# Patient Record
Sex: Female | Born: 1985 | Race: White | Hispanic: No | Marital: Single | Smoking: Former smoker
Health system: Southern US, Community
[De-identification: ages and names within clinical notes are randomized; demographics above are authoritative.]

## PROBLEM LIST (undated history)

## (undated) ENCOUNTER — Inpatient Hospital Stay (HOSPITAL_COMMUNITY): Payer: Self-pay

## (undated) DIAGNOSIS — B977 Papillomavirus as the cause of diseases classified elsewhere: Secondary | ICD-10-CM

## (undated) DIAGNOSIS — IMO0002 Reserved for concepts with insufficient information to code with codable children: Secondary | ICD-10-CM

## (undated) DIAGNOSIS — Z87440 Personal history of urinary (tract) infections: Secondary | ICD-10-CM

## (undated) DIAGNOSIS — N760 Acute vaginitis: Secondary | ICD-10-CM

## (undated) DIAGNOSIS — E282 Polycystic ovarian syndrome: Secondary | ICD-10-CM

## (undated) DIAGNOSIS — Z818 Family history of other mental and behavioral disorders: Secondary | ICD-10-CM

## (undated) DIAGNOSIS — Z8742 Personal history of other diseases of the female genital tract: Secondary | ICD-10-CM

## (undated) DIAGNOSIS — N189 Chronic kidney disease, unspecified: Secondary | ICD-10-CM

## (undated) DIAGNOSIS — B009 Herpesviral infection, unspecified: Secondary | ICD-10-CM

## (undated) DIAGNOSIS — I10 Essential (primary) hypertension: Secondary | ICD-10-CM

## (undated) DIAGNOSIS — Z87442 Personal history of urinary calculi: Secondary | ICD-10-CM

## (undated) DIAGNOSIS — R87619 Unspecified abnormal cytological findings in specimens from cervix uteri: Secondary | ICD-10-CM

## (undated) DIAGNOSIS — R300 Dysuria: Secondary | ICD-10-CM

## (undated) DIAGNOSIS — B9689 Other specified bacterial agents as the cause of diseases classified elsewhere: Secondary | ICD-10-CM

## (undated) DIAGNOSIS — R87629 Unspecified abnormal cytological findings in specimens from vagina: Secondary | ICD-10-CM

## (undated) DIAGNOSIS — N870 Mild cervical dysplasia: Secondary | ICD-10-CM

## (undated) DIAGNOSIS — B999 Unspecified infectious disease: Secondary | ICD-10-CM

## (undated) HISTORY — DX: Family history of other mental and behavioral disorders: Z81.8

## (undated) HISTORY — DX: Reserved for concepts with insufficient information to code with codable children: IMO0002

## (undated) HISTORY — PX: OTHER SURGICAL HISTORY: SHX169

## (undated) HISTORY — DX: Unspecified abnormal cytological findings in specimens from cervix uteri: R87.619

## (undated) HISTORY — DX: Papillomavirus as the cause of diseases classified elsewhere: B97.7

## (undated) HISTORY — DX: Mild cervical dysplasia: N87.0

## (undated) HISTORY — DX: Personal history of urinary (tract) infections: Z87.440

## (undated) HISTORY — DX: Personal history of other diseases of the female genital tract: Z87.42

## (undated) HISTORY — DX: Acute vaginitis: N76.0

## (undated) HISTORY — DX: Herpesviral infection, unspecified: B00.9

## (undated) HISTORY — DX: Dysuria: R30.0

## (undated) HISTORY — DX: Polycystic ovarian syndrome: E28.2

## (undated) HISTORY — DX: Other specified bacterial agents as the cause of diseases classified elsewhere: B96.89

---
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
## 2004-02-19 ENCOUNTER — Other Ambulatory Visit: Admission: RE | Admit: 2004-02-19 | Discharge: 2004-02-19 | Payer: Self-pay | Admitting: Obstetrics and Gynecology

## 2004-02-19 DIAGNOSIS — Z8742 Personal history of other diseases of the female genital tract: Secondary | ICD-10-CM

## 2004-02-19 HISTORY — DX: Personal history of other diseases of the female genital tract: Z87.42

## 2004-04-29 DIAGNOSIS — B9689 Other specified bacterial agents as the cause of diseases classified elsewhere: Secondary | ICD-10-CM

## 2004-04-29 HISTORY — DX: Other specified bacterial agents as the cause of diseases classified elsewhere: B96.89

## 2004-08-21 HISTORY — PX: TONSILLECTOMY: SUR1361

## 2005-04-04 ENCOUNTER — Other Ambulatory Visit: Admission: RE | Admit: 2005-04-04 | Discharge: 2005-04-04 | Payer: Self-pay | Admitting: Obstetrics and Gynecology

## 2005-04-04 DIAGNOSIS — R300 Dysuria: Secondary | ICD-10-CM

## 2005-04-04 HISTORY — DX: Dysuria: R30.0

## 2005-05-08 DIAGNOSIS — E282 Polycystic ovarian syndrome: Secondary | ICD-10-CM

## 2005-05-08 HISTORY — DX: Polycystic ovarian syndrome: E28.2

## 2006-05-29 ENCOUNTER — Other Ambulatory Visit: Admission: RE | Admit: 2006-05-29 | Discharge: 2006-05-29 | Payer: Self-pay | Admitting: Obstetrics and Gynecology

## 2007-08-01 DIAGNOSIS — B977 Papillomavirus as the cause of diseases classified elsewhere: Secondary | ICD-10-CM

## 2007-08-01 HISTORY — DX: Papillomavirus as the cause of diseases classified elsewhere: B97.7

## 2007-12-19 ENCOUNTER — Inpatient Hospital Stay (HOSPITAL_COMMUNITY): Admission: AD | Admit: 2007-12-19 | Discharge: 2007-12-19 | Payer: Self-pay | Admitting: Obstetrics and Gynecology

## 2007-12-24 ENCOUNTER — Inpatient Hospital Stay (HOSPITAL_COMMUNITY): Admission: AD | Admit: 2007-12-24 | Discharge: 2007-12-24 | Payer: Self-pay | Admitting: Obstetrics and Gynecology

## 2008-01-13 ENCOUNTER — Inpatient Hospital Stay (HOSPITAL_COMMUNITY): Admission: AD | Admit: 2008-01-13 | Discharge: 2008-01-13 | Payer: Self-pay | Admitting: Obstetrics and Gynecology

## 2008-01-15 ENCOUNTER — Encounter: Admission: RE | Admit: 2008-01-15 | Discharge: 2008-01-15 | Payer: Self-pay | Admitting: Obstetrics and Gynecology

## 2008-04-19 ENCOUNTER — Inpatient Hospital Stay (HOSPITAL_COMMUNITY): Admission: AD | Admit: 2008-04-19 | Discharge: 2008-04-19 | Payer: Self-pay | Admitting: Obstetrics and Gynecology

## 2008-08-06 ENCOUNTER — Inpatient Hospital Stay (HOSPITAL_COMMUNITY): Admission: AD | Admit: 2008-08-06 | Discharge: 2008-08-08 | Payer: Self-pay | Admitting: Obstetrics and Gynecology

## 2008-08-21 HISTORY — PX: CHOLECYSTECTOMY, LAPAROSCOPIC: SHX56

## 2008-10-13 ENCOUNTER — Emergency Department (HOSPITAL_COMMUNITY): Admission: EM | Admit: 2008-10-13 | Discharge: 2008-10-13 | Payer: Self-pay | Admitting: Emergency Medicine

## 2008-11-05 ENCOUNTER — Encounter (INDEPENDENT_AMBULATORY_CARE_PROVIDER_SITE_OTHER): Payer: Self-pay | Admitting: General Surgery

## 2008-11-05 ENCOUNTER — Ambulatory Visit (HOSPITAL_COMMUNITY): Admission: RE | Admit: 2008-11-05 | Discharge: 2008-11-05 | Payer: Self-pay | Admitting: General Surgery

## 2008-11-09 ENCOUNTER — Emergency Department (HOSPITAL_COMMUNITY): Admission: EM | Admit: 2008-11-09 | Discharge: 2008-11-09 | Payer: Self-pay | Admitting: Emergency Medicine

## 2009-10-13 ENCOUNTER — Inpatient Hospital Stay (HOSPITAL_COMMUNITY): Admission: AD | Admit: 2009-10-13 | Discharge: 2009-10-13 | Payer: Self-pay | Admitting: Obstetrics and Gynecology

## 2010-02-26 ENCOUNTER — Inpatient Hospital Stay (HOSPITAL_COMMUNITY): Admission: AD | Admit: 2010-02-26 | Discharge: 2010-02-28 | Payer: Self-pay | Admitting: Obstetrics and Gynecology

## 2010-05-02 DIAGNOSIS — IMO0002 Reserved for concepts with insufficient information to code with codable children: Secondary | ICD-10-CM

## 2010-05-02 HISTORY — DX: Reserved for concepts with insufficient information to code with codable children: IMO0002

## 2010-11-06 LAB — RPR: RPR Ser Ql: NONREACTIVE

## 2010-11-06 LAB — CBC
Hemoglobin: 13.3 g/dL (ref 12.0–15.0)
MCH: 33.2 pg (ref 26.0–34.0)
MCHC: 34.8 g/dL (ref 30.0–36.0)
MCHC: 35.1 g/dL (ref 30.0–36.0)
Platelets: 164 10*3/uL (ref 150–400)
Platelets: 194 10*3/uL (ref 150–400)
RDW: 14.2 % (ref 11.5–15.5)
RDW: 14.6 % (ref 11.5–15.5)
WBC: 13.9 10*3/uL — ABNORMAL HIGH (ref 4.0–10.5)

## 2010-11-09 LAB — DIFFERENTIAL
Basophils Absolute: 0 10*3/uL (ref 0.0–0.1)
Basophils Relative: 0 % (ref 0–1)
Eosinophils Absolute: 0.1 10*3/uL (ref 0.0–0.7)
Monocytes Absolute: 0.5 10*3/uL (ref 0.1–1.0)
Monocytes Relative: 5 % (ref 3–12)
Neutro Abs: 10 10*3/uL — ABNORMAL HIGH (ref 1.7–7.7)
Neutrophils Relative %: 88 % — ABNORMAL HIGH (ref 43–77)

## 2010-11-09 LAB — CBC
Hemoglobin: 11.2 g/dL — ABNORMAL LOW (ref 12.0–15.0)
MCHC: 34.7 g/dL (ref 30.0–36.0)
MCV: 92.3 fL (ref 78.0–100.0)
RBC: 3.5 MIL/uL — ABNORMAL LOW (ref 3.87–5.11)
RDW: 12.6 % (ref 11.5–15.5)

## 2010-11-09 LAB — URINALYSIS, ROUTINE W REFLEX MICROSCOPIC
Nitrite: NEGATIVE
Protein, ur: NEGATIVE mg/dL
Specific Gravity, Urine: 1.01 (ref 1.005–1.030)
Urobilinogen, UA: 0.2 mg/dL (ref 0.0–1.0)

## 2010-11-09 LAB — URINE MICROSCOPIC-ADD ON

## 2010-12-01 LAB — COMPREHENSIVE METABOLIC PANEL
AST: 263 U/L — ABNORMAL HIGH (ref 0–37)
BUN: 12 mg/dL (ref 6–23)
CO2: 27 mEq/L (ref 19–32)
Chloride: 106 mEq/L (ref 96–112)
Creatinine, Ser: 0.64 mg/dL (ref 0.4–1.2)
GFR calc non Af Amer: >60 mL/min (ref >60)
Glucose, Bld: 109 mg/dL — ABNORMAL HIGH (ref 70–99)
Total Bilirubin: 1.3 mg/dL — ABNORMAL HIGH (ref 0.3–1.2)

## 2010-12-01 LAB — CBC
HCT: 38.3 % (ref 36.0–46.0)
Hemoglobin: 13.4 g/dL (ref 12.0–15.0)
MCHC: 34.9 g/dL (ref 30.0–36.0)
MCV: 88.7 fL (ref 78.0–100.0)
RBC: 4.32 MIL/uL (ref 3.87–5.11)
WBC: 11.6 10*3/uL — ABNORMAL HIGH (ref 4.0–10.5)

## 2010-12-01 LAB — URINALYSIS, ROUTINE W REFLEX MICROSCOPIC
Bilirubin Urine: NEGATIVE
Glucose, UA: NEGATIVE mg/dL
Hgb urine dipstick: NEGATIVE
Specific Gravity, Urine: 1.022 (ref 1.005–1.030)

## 2010-12-01 LAB — DIFFERENTIAL
Basophils Absolute: 0 10*3/uL (ref 0.0–0.1)
Eosinophils Relative: 1 % (ref 0–5)
Lymphocytes Relative: 15 % (ref 12–46)
Neutro Abs: 9.2 10*3/uL — ABNORMAL HIGH (ref 1.7–7.7)
Neutrophils Relative %: 80 % — ABNORMAL HIGH (ref 43–77)

## 2010-12-01 LAB — LIPASE, BLOOD: Lipase: 21 U/L (ref 11–59)

## 2010-12-06 LAB — COMPREHENSIVE METABOLIC PANEL
AST: 32 U/L (ref 0–37)
Albumin: 4.4 g/dL (ref 3.5–5.2)
Calcium: 9.9 mg/dL (ref 8.4–10.5)
Creatinine, Ser: 0.83 mg/dL (ref 0.4–1.2)
GFR calc Af Amer: >60 mL/min (ref >60)

## 2010-12-06 LAB — URINALYSIS, ROUTINE W REFLEX MICROSCOPIC
Bilirubin Urine: NEGATIVE
Ketones, ur: NEGATIVE mg/dL
Nitrite: NEGATIVE
Specific Gravity, Urine: 1.028 (ref 1.005–1.030)
Urobilinogen, UA: 0.2 mg/dL (ref 0.0–1.0)

## 2010-12-06 LAB — DIFFERENTIAL
Basophils Relative: 1 % (ref 0–1)
Lymphocytes Relative: 34 % (ref 12–46)
Monocytes Relative: 4 % (ref 3–12)
Neutro Abs: 6.3 10*3/uL (ref 1.7–7.7)

## 2010-12-06 LAB — CBC
MCHC: 34.6 g/dL (ref 30.0–36.0)
MCV: 89.9 fL (ref 78.0–100.0)
WBC: 10.7 10*3/uL — ABNORMAL HIGH (ref 4.0–10.5)
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
## 2011-01-03 NOTE — Op Note (Signed)
[name] Bass, [name] Bass             ACCOUNT NO.:  [id number]   MEDICAL RECORD NO.:  [id number]          PATIENT TYPE:  AMB   LOCATION:  DAY                          [facility]ACILITY[facility]   PHYSICIAN:  Lennie Muckle, MD      DATE OF BIRTH:  02/14/1986   DATE OF PROCEDURE:  11/05/2008  DATE OF DISCHARGE:                               OPERATIVE REPORT   PREOPERATIVE DIAGNOSIS:  Biliary colic.   POSTOPERATIVE DIAGNOSIS:  Biliary colic.   PROCEDURE:  Laparoscopic cholecystectomy and attempted cholangiogram.   SURGEON:  Amber L. Freida Busman, MD.   ASSISTANT:  Anselm Pancoast. Zachery Dakins, M.D.   General endotracheal anesthesia.   FINDINGS:  Small cystic duct extravasation of contrast on attempted  cholangiogram.   SPECIMEN:  Gallbladder.   No known complications.   OTHER FINDINGS:  The gallbladder was inspected on the back table with  one entry into the gallbladder of  small stones.   ESTIMATED BLOOD LOSS:  Minimal.   No known complications and no drains.   INDICATIONS FOR PROCEDURE:  [name] Bass is a 25 year old female who had  multiple episodes of epigastric right upper quadrant pain.  This was  during her pregnancy.  She had also had some mild reflux disease.  She  did a trial of acid suppression.  This  did not fully relieve her  symptoms.  Therefore, she came to see me in postpartum period for a  cholecystectomy.  Informed consent was obtained prior to procedure.  She  did not have a markedly elevated liver enzymes and I talked to her about  possibly doing a cholangiogram at the time of surgery.   DETAILS OF PROCEDURE:  [name] Bass was identified in preoperative  holding area.  She received 2 grams of Ancef  IV and was taken to the  operating room.  Once in the operating room, placed in supine position.  After administration of general endotracheal anesthesia her abdomen was  clipped, prepped and draped in the usual sterile fashion.  A time-out  procedure to the patient and the  procedure was performed.  I placed the  incision at the umbilicus.  The fascia was grasped with Kocher.  The  Veress needle placed in the abdominal cavity.  After adequate  insufflation I placed an 11 mm trocar in the abdominal cavity.  All  layers of abdominal wall were visualized with the OptiView trocar.  I  then inspected the abdomen.  I found no evidence of injury from the  placement trocar or the Veress needle.  The patient was placed in the  right side up position.  Three trocars were placed in the abdomen and  visualization with the camera.  Gallbladder was identified in the normal  anatomic position.  The fundus was retracted to the head of the patient.  I then grasped the infundibulum away from the liver bed.  Using  electrocautery and the [place] forceps I dissected out the critical  view of the cystic duct and artery.  The cystic artery was posterior to  the cystic duct.  She had only very minimal rise in her  liver enzymes.  I wanted to perform a cholangiogram to fully ensure there were no  concerns for any common bile duct stones.  I placed a cholangiogram  catheter in the cystic duct.  This was  somewhat short.  At attempt at  cholangiogram the catheter was going up into the common duct.  We then  attempted to reposition the catheter.  However, I was unsuccessful to  position it in order to perform a full cholangiogram without  extravasation of contrast.  I saw no stones within duct and I did not  milk any stones out of the proximal cystic duct.  Therefore, I aborted  the cholangiogram catheter.  I did not want to risk injury to the common  duct.  I placed four clips proximally on the cystic duct and transected  with laparoscopic scissors.  I also clipped and divided the cystic  artery.  The remaining peritoneal attachments were dissected with  electrocautery.  The specimen was placed in the EndoCatch bag along with  a Ray-Tec which I had placed in the abdomen.  Inspection  of the liver  bed revealed no bleeding.  The clips were in place. After removing the  gallbladder from the umbilical incision I closed the fascial defect  using zero Vicryl suture in figure-of-eight fashion.  Final inspection  of the abdomen revealed no bleeding and no evidence of injury.  Pneumoperitoneum was released.  The trocars were removed and the skin  was closed with 4-0 Monocryl.  The patient was then extubated and  transferred to postanesthesia care unit in stable condition.   She will be discharged home with Percocet and follow up with me in  approximately 2 or 3 weeks.      Lennie Muckle, MD  Electronically Signed     ALA/MEDQ  D:  11/05/2008  T:  11/05/2008  Job:  [id number]

## 2011-01-03 NOTE — H&P (Signed)
NAMEGENIENE, LIST             ACCOUNT NO.:  [id number]   MEDICAL RECORD NO.:  [id number]          PATIENT TYPE:  INP   LOCATION:  9198                          FACILITY:  [facility]   PHYSICIAN:  Osborn Coho, M.D.   DATE OF BIRTH:  December 18, 1985   DATE OF ADMISSION:  08/06/2008  DATE OF DISCHARGE:                              HISTORY & PHYSICAL   Ms. Guevarra is a 25 year old gravida 1 at 39 weeks 1 day who presented  in the office this morning with contractions and was found to be 1 cm.  She was sent over for evaluation at the [facility] for continuing contractions  that were quite painful and was found to be 1 cm 4 hours later but was  having a hard time with the contractions. And so she was hydrated with  IV fluids and given some pain medicine and on the third exam at 5:40 in  the afternoon she was found to have cervical change noted to be 3 cm, 9%  and -3 with a slightly bulging bag of water. She was also evaluated with  ultrasound for an AFI due to possible spontaneous rupture of membranes  with a negative Fern test and her AFI was found to be 13.79. So she is  being admitted for following by the doctors at [facility].   Her pregnancy was remarkable for having an amoxicillin allergy, having a  history of herpes 2, and having an abnormal Pap and a history of PCOS.   TESTS:  Prenatal labs include initial evaluation on Jan 01, 2008 with a  hemoglobin of 12.8, hematocrit 36.8 and platelets 295.  Blood type A+,  antibody screen negative, RPR nonreactive. Rubella titer is not listed.  Hepatitis B negative, HIV nonreactive. Gonorrhea and Chlamydia cultures  were negative. In May Ms. Joswick had a Glucola at 27 weeks and the end  of September with a result of 121. At that time her hemoglobin was 11.2.  Her beta strep test was done on November 19, and that was negative.   HISTORY OF PRESENT PREGNANCY:  Ms. Lineberry had her new OB interview on  May 12 and then had her new OB exam  the next day.  She was seen in the  maternity admissions unit later that week for nausea, vomiting and  diarrhea. She had some follow-up imaging done to rule out gallstones  which showed some gallstones and sludge.  She was diagnosed with  viridans Streptococcus. At 15 weeks she had an AFP which was normal. She  had a urine test of cure also which showed no growth. At 19 weeks she  had an anatomy ultrasound which showed a single intrauterine pregnancy  with size consistent with dates.  Her cervix at that time was 3.88 cm  with fluid levels normal and normal anatomy. In the first week of August  she was re-treated for UTI with Macrobid.  At 27 weeks she had a Glucola  that was normal. She got an h1n1 vaccine on October 22. She was started  on Valtrex at 33 weeks on November 15 for suppression of HSV2.  At 36  weeks she had repeated Chlamydia and gonorrhea cultures which were all  negative. And she is now 39 weeks and 1 day and in labor.   OBSTETRICAL HISTORY:  As stated earlier this is her first pregnancy. I  believe it was a spontaneous pregnancy, although she has a history of  PCOS.   ALLERGIES:  She gets a rash with amoxicillin.   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   SURGICAL HISTORY:  She had a wisdom tooth extraction in 2001 and  tonsillectomy 2006.   Genetic history is noncontributory. I do not see a cystic fibrosis  screen listed, the patient declined.   FAMILY HISTORY:  The patient's mother committed suicide 3 years ago.  Paternal uncles have high blood pressure. Maternal uncles and aunts have  diabetes. Maternal grandmother had ovarian cancer and maternal aunt had  lung cancer.   SOCIAL HISTORY:  The patient is single.  Father of baby is named as  Laverda Sorenson. I am not sure if they are together at this point.  The  patient did state that she moved yesterday. I believed she moved in with  her dad and  her step-mom.  The patient has a high school diploma and  works as a Child psychotherapist. Father of the baby has a high school diploma and  works as a Financial risk analyst. She declines to state a religion.  She is a Caucasian.  She does have a history of smoking off and on but three times a week  before the pregnancy, and she stopped during the pregnancy. She denies  use of alcohol.  She does report some use of marijuana several times a  week during the first part of pregnancy before she found out that she  was pregnant in the first month.   PHYSICAL EXAM:  VITAL SIGNS:  The patient's vital studies are stable.  She is afebrile.  GENERAL APPEARANCE:  She is noted to be very tense and tearful.  HEENT: Within normal limits.  LUNGS: Clear to auscultation bilaterally.  HEART: Regular rate and rhythm.  No murmurs.  BREASTS: Soft and nontender.  ABDOMEN:  Soft and nontender, gravid. Soft palpation between  contractions.  Fundal height 39 cm.  SKIN:  Noted to have multiple tattoos in various locations.  EXTREMITIES:  With trace edema of the hands and feet. Normal DTRs.   IMPRESSION:  A 25 year old gravida 1 at 26 weeks with early labor.  Very  painful contractions and seems to be very tense and somewhat emotionally  distressed. Reassuring fetal heart tones. Contractions are every 2-3  minutes.   PLAN:  Admit to the MD services.  Epidural. Anticipate normal  spontaneous vaginal delivery.      Eulogio Bear, CNM      ______________________________  Osborn Coho, M.D.    JM/MEDQ  D:  08/06/2008  T:  08/06/2008  Job:  [id number]

## 2011-02-28 ENCOUNTER — Inpatient Hospital Stay (HOSPITAL_COMMUNITY)
Admission: AD | Admit: 2011-02-28 | Discharge: 2011-02-28 | Disposition: A | Discharge status: Home / Self Care | Payer: Self-pay | Source: Ambulatory Visit | Attending: Obstetrics and Gynecology | Admitting: Obstetrics and Gynecology

## 2011-02-28 ENCOUNTER — Inpatient Hospital Stay (HOSPITAL_COMMUNITY): Payer: Self-pay

## 2011-02-28 ENCOUNTER — Encounter (HOSPITAL_COMMUNITY): Payer: Self-pay | Admitting: *Deleted

## 2011-02-28 DIAGNOSIS — R1030 Lower abdominal pain, unspecified: Secondary | ICD-10-CM

## 2011-02-28 DIAGNOSIS — R109 Unspecified abdominal pain: Secondary | ICD-10-CM | POA: Insufficient documentation

## 2011-02-28 LAB — CBC
HCT: 38.3 % (ref 36.0–46.0)
MCH: 30.4 pg (ref 26.0–34.0)
MCV: 87.6 fL (ref 78.0–100.0)
RBC: 4.37 MIL/uL (ref 3.87–5.11)
WBC: 14.3 10*3/uL — ABNORMAL HIGH (ref 4.0–10.5)

## 2011-02-28 LAB — DIFFERENTIAL
Basophils Absolute: 0 10*3/uL (ref 0.0–0.1)
Basophils Relative: 0 % (ref 0–1)
Eosinophils Relative: 1 % (ref 0–5)
Monocytes Absolute: 0.8 10*3/uL (ref 0.1–1.0)
Neutro Abs: 10.5 10*3/uL — ABNORMAL HIGH (ref 1.7–7.7)

## 2011-02-28 LAB — URINALYSIS, ROUTINE W REFLEX MICROSCOPIC
Bilirubin Urine: NEGATIVE
Specific Gravity, Urine: 1.02 (ref 1.005–1.030)
Urobilinogen, UA: 0.2 mg/dL (ref 0.0–1.0)
pH: 7 (ref 5.0–8.0)

## 2011-02-28 LAB — WET PREP, GENITAL
Clue Cells Wet Prep HPF POC: NONE SEEN
Yeast Wet Prep HPF POC: NONE SEEN

## 2011-02-28 LAB — URINE MICROSCOPIC-ADD ON

## 2011-02-28 LAB — POCT PREGNANCY, URINE: Preg Test, Ur: NEGATIVE

## 2011-02-28 MED ORDER — CYCLOBENZAPRINE HCL 5 MG PO TABS: 5.0000 mg | ORAL_TABLET | Freq: Three times a day (TID) | ORAL | Status: AC | PRN

## 2011-02-28 MED ORDER — IBUPROFEN 800 MG PO TABS: 800.0000 mg | ORAL_TABLET | Freq: Three times a day (TID) | ORAL | Status: AC | PRN

## 2011-02-28 MED ORDER — IBUPROFEN 200 MG PO TABS: 600.0000 mg | ORAL_TABLET | Freq: Four times a day (QID) | ORAL | Status: DC | PRN

## 2011-02-28 NOTE — Initial Assessments (Signed)
Pt having sharp lower abd pain that started with intercourse today.  Pt LMP 01/15/2011, pt breastfeeding x .  Pt G2 P2.

## 2011-02-28 NOTE — ED Provider Notes (Signed)
History     Chief Complaint  Patient presents with  . Abdominal Pain   HPI Comments: In mutually monogamous relationship.   Abdominal Pain This is a new problem. The current episode started today (during intercourse). The onset quality is sudden. The problem occurs constantly. The problem has been gradually improving. The pain is located in the suprapubic region. The pain is at a severity of 6/10. The pain is moderate. The quality of the pain is sharp. The abdominal pain does not radiate. Associated symptoms include nausea (mild). Pertinent negatives include no constipation, diarrhea, dysuria, fever, frequency, hematuria or vomiting. It is movement what aggravates the pain. The pain is relieved by being still. She has tried nothing for the symptoms. Her past medical history is significant for abdominal surgery and gallstones.    History reviewed. No pertinent past medical history.  Past Surgical History  Procedure Date  . Tonsillectomy   . Cholecystectomy, laparoscopic 2010    No family history on file.  History  Substance Use Topics  . Smoking status: Never Smoker   . Smokeless tobacco: Not on file  . Alcohol Use: 0.0 oz/week    0 Glasses of wine per week    OB History    Grav Para Term Preterm Abortions TAB SAB Ect Mult Living   2 2 2       2       Review of Systems  Constitutional: Negative for fever.  Gastrointestinal: Positive for nausea (mild) and abdominal pain. Negative for vomiting, diarrhea and constipation.  Genitourinary: Negative for dysuria, frequency and hematuria.  All other systems reviewed and are negative.    Physical Exam  BP 117/77  Pulse 83  Temp(Src) 98.6 F (37 C) (Oral)  Resp 20  Ht 5\' 9"  (1.753 m)  Wt 79.096 kg (174 lb 6 oz)  BMI 25.75 kg/m2  LMP 01/15/2011  UA neg  Physical Exam  Constitutional: She is oriented to person, place, and time. She appears well-developed and well-nourished. No distress.  HENT:  Head: Normocephalic.    Abdominal: Soft. Bowel sounds are normal. She exhibits no distension and no mass. There is tenderness. There is guarding. There is no rebound.  Genitourinary: Vagina normal. Uterus is tender (size difficult to assess due to tenderness). Cervix exhibits motion tenderness. Cervix exhibits no discharge and no friability. Right adnexum displays tenderness. Right adnexum displays no mass and no fullness. Left adnexum displays tenderness. Left adnexum displays no mass and no fullness. No bleeding around the vagina. No vaginal discharge found.  Neurological: She is alert and oriented to person, place, and time.  Skin: Skin is warm and dry.  Psychiatric: She has a normal mood and affect.    ED Course  Procedures  Assessment: 1. Low abd pain of unknown etiology, improving spontaneously  Plan:  1. Pelvic US 2. CBC with dif

## 2011-02-28 NOTE — ED Provider Notes (Signed)
HPI Review of Systems Physical Exam Procedures

## 2011-02-28 NOTE — Progress Notes (Signed)
Pt states, " I low abdomen started hurting just before I came here and it radiates in my vaginal area and rectum."

## 2011-02-28 NOTE — ED Notes (Signed)
Pelvic exam by Ivonne Andrew CNM.

## 2011-02-28 NOTE — ED Provider Notes (Signed)
 *  RADIOLOGY REPORT*  Clinical Data: Abdominal pain. Pelvic pain. Postcoital pelvic  pain.  TRANSABDOMINAL AND TRANSVAGINAL ULTRASOUND OF PELVIS  Technique: Both transabdominal and transvaginal ultrasound  examinations of the pelvis were performed. Transabdominal technique  was performed for global imaging of the pelvis including uterus,  ovaries, adnexal regions, and pelvic cul-de-sac.  Comparison: None.  It was necessary to proceed with endovaginal exam following the  transabdomnial exam to visualize the right ovary.  Findings:  Uterus: 91 mm x 53 mm x 59 mm, with normal myometrial echotexture.  Endometrium: 19 mm, thickened.No focal mass lesion. Endometrial  thickening is homogeneous.  Right ovary: 51 mm x 32 mm x 58 mm. Multiple follicles are  present. Color-flow appears normal.  Left ovary: 34 mm x 24 mm x 30 mm. Normal color flow.Physiologic  appearance with follicles.  Other findings: Small amount of free fluid is present in the pouch  of Douglas. This is probably physiologic.  IMPRESSION:  No acute abnormality. Diffuse endometrial thickening, the clinical  significanceof which is unclear in a patient without vaginal  bleeding. If dysfunctional uterine bleeding is present, consider  repeat examination on day seven of cycle.  Original Report Authenticated By: Andreas Newport, M.D.    Results for [name] Bass, [name] Bass (MRN [id number]) as of 02/28/2011 23:41  Ref. Range 02/28/2011 21:31  WBC Latest Range: 4.0-10.5 K/uL 14.3 (H)  RBC Latest Range: 3.87-5.11 MIL/uL 4.37  HGB Latest Range: 12.0-15.0 g/dL 40.9  HCT Latest Range: 36.0-46.0 % 38.3  MCV Latest Range: 78.0-100.0 fL 87.6  MCH Latest Range: 26.0-34.0 pg 30.4  MCHC Latest Range: 30.0-36.0 g/dL 81.1  RDW Latest Range: 11.5-15.5 % 12.5  Platelets Latest Range: 150-400 K/uL 277  Neutrophils Relative Latest Range: 43-77 % 73  Lymphocytes Relative Latest Range: 12-46 % 20  Monocytes Relative Latest Range: 3-12 % 6    Eosinophils Relative Latest Range: 0-5 % 1  Basophils Relative Latest Range: 0-1 % 0  Neutrophils Absolute Latest Range: 1.7-7.7 K/uL 10.5 (H)  Lymphocytes Absolute Latest Range: 0.7-4.0 K/uL 2.9  Monocytes Absolute Latest Range: 0.1-1.0 K/uL 0.8  Eosinophils Absolute Latest Range: 0.0-0.7 K/uL 0.2  Basophils Absolute Latest Range: 0.0-0.1 K/uL 0.0   Wet prep: neg GC/CT: Pending  Assessment:  1. Abd pain of unknown etiology  Plan: 1. D/C home per consult with Dr. Su Hilt 2. F/U at [facility] in 2-3 days or PRN for worsening of Sx 3. Rx Flexeril and Ibuprofen

## 2011-03-27 ENCOUNTER — Emergency Department (HOSPITAL_COMMUNITY): Payer: Self-pay

## 2011-03-27 ENCOUNTER — Emergency Department (HOSPITAL_COMMUNITY)
Admission: EM | Admit: 2011-03-27 | Discharge: 2011-03-27 | Disposition: A | Discharge status: Home / Self Care | Payer: Self-pay | Attending: Emergency Medicine | Admitting: Emergency Medicine

## 2011-03-27 DIAGNOSIS — R1084 Generalized abdominal pain: Secondary | ICD-10-CM | POA: Insufficient documentation

## 2011-03-27 LAB — DIFFERENTIAL
Eosinophils Absolute: 0.1 10*3/uL (ref 0.0–0.7)
Eosinophils Relative: 1 % (ref 0–5)
Lymphs Abs: 2.1 10*3/uL (ref 0.7–4.0)
Monocytes Absolute: 0.4 10*3/uL (ref 0.1–1.0)
Monocytes Relative: 4 % (ref 3–12)

## 2011-03-27 LAB — URINALYSIS, ROUTINE W REFLEX MICROSCOPIC
Bilirubin Urine: NEGATIVE
Specific Gravity, Urine: 1.029 (ref 1.005–1.030)
Urobilinogen, UA: 1 mg/dL (ref 0.0–1.0)

## 2011-03-27 LAB — POCT I-STAT, CHEM 8
BUN: 6 mg/dL (ref 6–23)
Calcium, Ion: 1.11 mmol/L — ABNORMAL LOW (ref 1.12–1.32)
Chloride: 109 mEq/L (ref 96–112)
Glucose, Bld: 96 mg/dL (ref 70–99)

## 2011-03-27 LAB — CBC
MCH: 31.1 pg (ref 26.0–34.0)
MCHC: 35.5 g/dL (ref 30.0–36.0)
MCV: 87.7 fL (ref 78.0–100.0)
Platelets: 229 10*3/uL (ref 150–400)
RDW: 12.4 % (ref 11.5–15.5)

## 2011-03-27 LAB — HEPATIC FUNCTION PANEL
AST: 22 U/L (ref 0–37)
Albumin: 3.9 g/dL (ref 3.5–5.2)
Total Bilirubin: 0.3 mg/dL (ref 0.3–1.2)

## 2011-03-27 LAB — URINE MICROSCOPIC-ADD ON

## 2011-03-28 ENCOUNTER — Encounter (HOSPITAL_COMMUNITY): Payer: Self-pay

## 2011-03-28 ENCOUNTER — Emergency Department (HOSPITAL_COMMUNITY): Payer: Self-pay

## 2011-03-28 ENCOUNTER — Emergency Department (HOSPITAL_COMMUNITY)
Admission: EM | Admit: 2011-03-28 | Discharge: 2011-03-28 | Disposition: A | Discharge status: Home / Self Care | Payer: Self-pay | Attending: Emergency Medicine | Admitting: Emergency Medicine

## 2011-03-28 DIAGNOSIS — Z9089 Acquired absence of other organs: Secondary | ICD-10-CM | POA: Insufficient documentation

## 2011-03-28 DIAGNOSIS — R109 Unspecified abdominal pain: Secondary | ICD-10-CM | POA: Insufficient documentation

## 2011-03-28 DIAGNOSIS — R10819 Abdominal tenderness, unspecified site: Secondary | ICD-10-CM | POA: Insufficient documentation

## 2011-03-28 LAB — DIFFERENTIAL
Eosinophils Absolute: 0.2 10*3/uL (ref 0.0–0.7)
Lymphs Abs: 2.9 10*3/uL (ref 0.7–4.0)
Monocytes Relative: 6 % (ref 3–12)
Neutro Abs: 6.3 10*3/uL (ref 1.7–7.7)
Neutrophils Relative %: 63 % (ref 43–77)

## 2011-03-28 LAB — COMPREHENSIVE METABOLIC PANEL
AST: 15 U/L (ref 0–37)
Albumin: 3.9 g/dL (ref 3.5–5.2)
BUN: 11 mg/dL (ref 6–23)
Calcium: 9.3 mg/dL (ref 8.4–10.5)
Creatinine, Ser: 0.58 mg/dL (ref 0.50–1.10)
Total Protein: 7.3 g/dL (ref 6.0–8.3)

## 2011-03-28 LAB — LIPASE, BLOOD: Lipase: 22 U/L (ref 11–59)

## 2011-03-28 LAB — CBC
HCT: 39.1 % (ref 36.0–46.0)
MCHC: 33 g/dL (ref 30.0–36.0)
MCV: 89.3 fL (ref 78.0–100.0)
Platelets: 265 10*3/uL (ref 150–400)
RDW: 12.6 % (ref 11.5–15.5)

## 2011-03-28 MED ORDER — IOHEXOL 300 MG/ML  SOLN
100.0000 mL | Freq: Once | INTRAMUSCULAR | Status: AC | PRN
  Administered 2011-03-28: 100 mL via INTRAVENOUS

## 2011-05-16 LAB — TSH: TSH: 0.194 — ABNORMAL LOW

## 2011-05-17 LAB — DIFFERENTIAL
Lymphocytes Relative: 2 — ABNORMAL LOW
Lymphs Abs: 0.3 — ABNORMAL LOW
Neutrophils Relative %: 98 — ABNORMAL HIGH

## 2011-05-17 LAB — LIPASE, BLOOD: Lipase: 19

## 2011-05-17 LAB — COMPREHENSIVE METABOLIC PANEL
AST: 20
CO2: 21
Calcium: 8.7
Creatinine, Ser: 0.62
GFR calc Af Amer: >60
GFR calc non Af Amer: >60
Glucose, Bld: 260 — ABNORMAL HIGH

## 2011-05-17 LAB — URINALYSIS, ROUTINE W REFLEX MICROSCOPIC
Leukocytes, UA: NEGATIVE
Nitrite: NEGATIVE
Specific Gravity, Urine: >1.03 — ABNORMAL HIGH
Urobilinogen, UA: 0.2

## 2011-05-17 LAB — URINE MICROSCOPIC-ADD ON

## 2011-05-17 LAB — CBC
MCHC: 35.3
MCV: 91.7
RBC: 3.82 — ABNORMAL LOW
RDW: 12.5

## 2011-05-17 LAB — AMYLASE: Amylase: 53

## 2011-05-26 LAB — CBC
HCT: 30.4 % — ABNORMAL LOW (ref 36.0–46.0)
Hemoglobin: 12.3 g/dL (ref 12.0–15.0)
MCV: 92.9 fL (ref 78.0–100.0)
RBC: 3.27 MIL/uL — ABNORMAL LOW (ref 3.87–5.11)
RBC: 4.01 MIL/uL (ref 3.87–5.11)
WBC: 15.3 10*3/uL — ABNORMAL HIGH (ref 4.0–10.5)

## 2011-08-22 NOTE — L&D Delivery Note (Signed)
Delivery Note Pt progressed rapidly to C/C/+2 at 1748 with urge to push.  FHR with accels noted. At 5:59 PM a viable female was delivered via Vaginal, Spontaneous Delivery (Presentation: Left Occiput Anterior). No nuchal cord noted.  No difficulty with shoulders.  Infant with spontaneous lusty cry.  Infant dried and placed on maternal chest skin to skin.  Cord doubly clamped and cord cut by pt.    APGAR: 9, 10; weight 8 lb 14.5 oz (4040 g).   Placenta status: Intact, Spontaneous.  Cord: 3 vessels with the following complications: None.  Cord pH: N/A  Anesthesia: Epidural  Episiotomy: None Lacerations: Perineal 2nd degree Suture Repair: 3.0 vicryl Est. Blood Loss (mL): 250  Mom to postpartum.  Baby to nursery-stable.  Pt desires PP BTL and RBA d/w pt.  Will leave in epidural catheter and IV until BTL tomorrow.    Veronica Fretz O. 08/04/2012, 8:24 AM

## 2011-11-24 ENCOUNTER — Emergency Department (HOSPITAL_COMMUNITY): Payer: Medicaid Other

## 2011-11-24 ENCOUNTER — Encounter (HOSPITAL_COMMUNITY): Payer: Self-pay | Admitting: Family Medicine

## 2011-11-24 ENCOUNTER — Emergency Department (HOSPITAL_COMMUNITY)
Admission: EM | Admit: 2011-11-24 | Discharge: 2011-11-24 | Disposition: A | Discharge status: Home / Self Care | Payer: Medicaid Other | Attending: Emergency Medicine | Admitting: Emergency Medicine

## 2011-11-24 DIAGNOSIS — R10813 Right lower quadrant abdominal tenderness: Secondary | ICD-10-CM | POA: Insufficient documentation

## 2011-11-24 DIAGNOSIS — R110 Nausea: Secondary | ICD-10-CM | POA: Insufficient documentation

## 2011-11-24 DIAGNOSIS — R10814 Left lower quadrant abdominal tenderness: Secondary | ICD-10-CM | POA: Insufficient documentation

## 2011-11-24 DIAGNOSIS — R109 Unspecified abdominal pain: Secondary | ICD-10-CM | POA: Insufficient documentation

## 2011-11-24 DIAGNOSIS — Z331 Pregnant state, incidental: Secondary | ICD-10-CM

## 2011-11-24 LAB — URINALYSIS, ROUTINE W REFLEX MICROSCOPIC
Bilirubin Urine: NEGATIVE
Glucose, UA: NEGATIVE mg/dL
Ketones, ur: NEGATIVE mg/dL
Leukocytes, UA: NEGATIVE
pH: 7 (ref 5.0–8.0)

## 2011-11-24 LAB — WET PREP, GENITAL
Trich, Wet Prep: NONE SEEN
Yeast Wet Prep HPF POC: NONE SEEN

## 2011-11-24 MED ORDER — PRENATAL COMPLETE 14-0.4 MG PO TABS: 1.0000 | ORAL_TABLET | Freq: Every day | ORAL | Status: DC

## 2011-11-24 NOTE — ED Notes (Signed)
Per EMS: Pt having abdominal pain and nausea since 08:00 this morning.

## 2011-11-24 NOTE — Discharge Instructions (Signed)
Please follow up with your obstetrician first thing Monday morning.  You will need to have your blood work repeated and may need a repeat ultrasound.  You were also found to have "few clue cells" on your wet prep but no symptoms - please discuss possible treatment with your doctor.  Please have your doctor review all of your results from your ER visit.  If you develop abdominal pain, vaginal bleeding, or abnormal vaginal discharge or discomfort, please go to [facility] for immediate reevaluation.  You may return to the ER at any time for worsening condition or any new symptoms that concern you.  Pregnancy If you are planning on getting pregnant, it is a good idea to make a preconception appointment with your care- giver to discuss having a healthy lifestyle before getting pregnant. Such as, diet, weight, exercise, taking prenatal vitamins especially folic acid (it helps prevent brain and spinal cord defects), avoiding alcohol, smoking and illegal drugs, medical problems (diabetes, convulsions), family history of genetic problems, working conditions and immunizations. It is better to have knowledge of these things and do something about them before getting pregnant. In your pregnancy, it is important to follow certain guidelines to have a healthy baby. It is very important to get good prenatal care and follow your caregiver's instructions. Prenatal care includes all the medical care you receive before your baby's birth. This helps to prevent problems during the pregnancy and childbirth. HOME CARE INSTRUCTIONS   Start your prenatal visits by the 12th week of pregnancy or before when possible. They are usually scheduled monthly at first. They are more often in the last 2 months before delivery. It is important that you keep your caregiver's appointments and follow your caregiver's instructions regarding medication use, exercise, and diet.   During pregnancy, you are providing food for you and your baby.  Eat a regular, well-balanced diet. Choose foods such as meat, fish, milk and other dairy products, vegetables, fruits, whole-grain breads and cereals. Your caregiver will inform you of the ideal weight gain depending on your current height and weight. Drink lots of liquids. Try to drink 8 glasses of water a day.   Alcohol is associated with a number of birth defects including fetal alcohol syndrome. It is best to avoid alcohol completely. Smoking will cause low birth rate and prematurity. Use of alcohol and nicotine during your pregnancy also increases the chances that your child will be chemically dependent later in their life and may contribute to SIDS (Sudden Infant Death Syndrome).   Do not use illegal drugs.   Only take prescription or over-the-counter medications that are recommended by your caregiver. Other medications can cause genetic and physical problems in the baby.   Morning sickness can often be helped by keeping soda crackers at the bedside. Eat a couple before arising in the morning.   A sexual relationship may be continued until near the end of pregnancy if there are no other problems such as early (premature) leaking of amniotic fluid from the membranes, vaginal bleeding, painful intercourse or belly (abdominal) pain.   Exercise regularly. Check with your caregiver if you are unsure of the safety of some of your exercises.   Do not use hot tubs, steam rooms or saunas. These increase the risk of fainting or passing out and hurting yourself and the baby. Swimming is OK for exercise. Get plenty of rest, including afternoon naps when possible especially in the third trimester.   Avoid toxic odors and chemicals.   Do  not wear high heels. They may cause you to lose your balance and fall.   Do not lift over 5 pounds. If you do lift anything, lift with your legs and thighs, not your back.   Avoid long trips, especially in the third trimester.   If you have to travel out of the city  or state, take a copy of your medical records with you.  SEEK IMMEDIATE MEDICAL CARE IF:   You develop an unexplained oral temperature above 102 F (38.9 C), or as your caregiver suggests.   You have leaking of fluid from the vagina. If leaking membranes are suspected, take your temperature and inform your caregiver of this when you call.   There is vaginal spotting or bleeding. Notify your caregiver of the amount and how many pads are used.   You continue to feel sick to your stomach (nauseous) and have no relief from remedies suggested, or you throw up (vomit) blood or coffee ground like materials.   You develop upper abdominal pain.   You have round ligament discomfort in the lower abdominal area. This still must be evaluated by your caregiver.   You feel contractions of the uterus.   You do not feel the baby move, or there is less movement than before.   You have painful urination.   You have abnormal vaginal discharge.   You have persistent diarrhea.   You get a severe headache.   You have problems with your vision.   You develop muscle weakness.   You feel dizzy and faint.   You develop shortness of breath.   You develop chest pain.   You have back pain that travels down to your leg and feet.   You feel irregular or a very fast heartbeat.   You develop excessive weight gain in a short period of time (5 pounds in 3 to 5 days).   You are involved with a domestic violence situation.  Document Released: 08/07/2005 Document Revised: 07/27/2011 Document Reviewed: 01/29/2009 [facility] Patient Information 2012 [place], [place].

## 2011-11-24 NOTE — ED Provider Notes (Signed)
History     CSN: [id number]  Arrival date & time 11/24/11  1324   First MD Initiated Contact with Patient 11/24/11 1645      Chief Complaint  Patient presents with  . Abdominal Pain  . Nausea    (Consider location/radiation/quality/duration/timing/severity/associated sxs/prior treatment) HPI Comments: Patient reports this morning she had severe diffuse abdominal pain with nausea.  Pain is currently resolved.  LMP February 15 vs 18.  [id number].  Denies vaginal discharge or fever, change in bowel habits, diarrhea or constipation, urinary symptoms, vomiting, fever.    Patient is a 26 y.o. female presenting with abdominal pain. The history is provided by the patient.  Abdominal Pain The primary symptoms of the illness include abdominal pain. The primary symptoms of the illness do not include fever, shortness of breath, vomiting, diarrhea, dysuria, vaginal discharge or vaginal bleeding.  Symptoms associated with the illness do not include constipation, urgency or frequency.    History reviewed. No pertinent past medical history.  Past Surgical History  Procedure Date  . Tonsillectomy   . Cholecystectomy, laparoscopic 2010  . Cholecystectomy     History reviewed. No pertinent family history.  History  Substance Use Topics  . Smoking status: Never Smoker   . Smokeless tobacco: Not on file  . Alcohol Use: 0.0 oz/week    0 Glasses of wine per week    OB History    Grav Para Term Preterm Abortions TAB SAB Ect Mult Living   2 2 2       2       Review of Systems  Constitutional: Negative for fever.  Respiratory: Negative for shortness of breath.   Cardiovascular: Negative for chest pain.  Gastrointestinal: Positive for abdominal pain. Negative for vomiting, diarrhea, constipation and blood in stool.  Genitourinary: Positive for menstrual problem. Negative for dysuria, urgency, frequency, vaginal bleeding and vaginal discharge.  All other systems reviewed and are  negative.    Allergies  Amoxicillin  Home Medications   Current Outpatient Rx  Name Route Sig Dispense Refill  . GOODYS EXTRA STRENGTH PO Oral Take 1 packet by mouth every 6 (six) hours as needed. For pain      BP 129/78  Pulse 86  Temp(Src) 98.4 F (36.9 C) (Oral)  Resp 20  SpO2 97%  LMP 10/06/2011  Physical Exam  Nursing note and vitals reviewed. Constitutional: She is oriented to person, place, and time. She appears well-developed and well-nourished. No distress.  HENT:  Head: Normocephalic and atraumatic.  Neck: Neck supple.  Cardiovascular: Normal rate, regular rhythm and normal heart sounds.   Pulmonary/Chest: Breath sounds normal. No respiratory distress. She has no wheezes. She has no rales. She exhibits no tenderness.  Abdominal: Soft. Bowel sounds are normal. She exhibits no distension and no mass. There is tenderness in the right lower quadrant, suprapubic area and left lower quadrant. There is no rebound and no guarding.  Genitourinary: Uterus is not tender. Cervix exhibits no motion tenderness and no friability. Right adnexum displays no mass, no tenderness and no fullness. Left adnexum displays no mass, no tenderness and no fullness. No tenderness around the vagina. No vaginal discharge found.  Neurological: She is alert and oriented to person, place, and time.  Skin: She is not diaphoretic.  Psychiatric: She has a normal mood and affect. Her behavior is normal. Judgment and thought content normal.    ED Course  Procedures (including critical care time)  Labs Reviewed  URINALYSIS, ROUTINE W REFLEX MICROSCOPIC - Abnormal;  Notable for the following:    APPearance CLOUDY (*)    All other components within normal limits  POCT PREGNANCY, URINE - Abnormal; Notable for the following:    Preg Test, Ur POSITIVE (*)    All other components within normal limits  WET PREP, GENITAL - Abnormal; Notable for the following:    Clue Cells Wet Prep HPF POC FEW (*)    WBC,  Wet Prep HPF POC FEW (*)    All other components within normal limits  HCG, QUANTITATIVE, PREGNANCY - Abnormal; Notable for the following:    hCG, Beta Chain, Quant, S 57 (*)    All other components within normal limits  ABO/RH  GC/CHLAMYDIA PROBE AMP, GENITAL   US Ob Comp Less 14 Wks  11/24/2011  *RADIOLOGY REPORT*  Clinical Data: Early pregnancy with beta HCG level of 57. Abdominal pain.  OBSTETRIC <14 WK [place] AND TRANSVAGINAL OB US  Technique: Both transabdominal and transvaginal ultrasound examinations were performed for complete evaluation of the gestation as well as the maternal uterus, adnexal regions, and pelvic cul-de-sac.  Findings: With transvaginal imaging, the endometrial stripe is measured at 15 mm.  Uterine length is 10.2 cm.  A definite double decidual reaction is not observed.  No intrauterine gestational sac is visible.  The right ovary measures 4.0 x 2.7 x 3.4 cm and appears normal.  The left ovary measures 4.3 x 3.2 x 3.1 cm and contains a 1.5 x 0.9 cm complex cystic lesion probably representing a corpus luteum centrally.  Trace free pelvic fluid is observed.  IMPRESSION: 1.  No intrauterine gestational sac is observed.  Given the quantitative beta HCG level of 57, this is probably due to the very early pregnancy.  Other possibilities include recent spontaneous abortion, or occult ectopic pregnancy.  Correlation with quantitative beta HCG trend going forward, and a low threshold for reimaging are recommended. 2.  Trace free pelvic fluid, likely physiologic.  Original Report Authenticated By: Dellia Cloud, M.D.     1. Pregnancy as incidental finding       MDM  [id number] Patient with abdominal pain and LMP mid February found to be pregnant.  Beta HCG consistent with very early gestation, [place] did not show gestational sack.  Pt advised to follow closely with obstetrician for a recheck.  Also discussed wet prep findings with the patient.  Patient is not having any symptoms and  very scant discharge in vaginal on exam; however, few clue cells found on wet prep.  Given then, I have discussed this with patient and have asked her to discussed treatment vs no treatment with her doctor on Monday.  Patient verbalizes understanding and agrees with plan.          Dillard Cannon [place], [place] 11/24/11 2129

## 2011-11-25 LAB — GC/CHLAMYDIA PROBE AMP, GENITAL: GC Probe Amp, Genital: NEGATIVE

## 2011-11-25 NOTE — ED Provider Notes (Signed)
Medical screening examination/treatment/procedure(s) were performed by non-physician practitioner and as supervising physician I was immediately available for consultation/collaboration.   Loren Racer, MD 11/25/11 0040

## 2011-11-28 ENCOUNTER — Encounter: Payer: Self-pay | Admitting: Obstetrics and Gynecology

## 2011-11-28 ENCOUNTER — Ambulatory Visit (INDEPENDENT_AMBULATORY_CARE_PROVIDER_SITE_OTHER): Payer: Medicaid Other | Admitting: Obstetrics and Gynecology

## 2011-11-28 VITALS — BP 120/70 | HR 74 | Wt 202.0 lb

## 2011-11-28 DIAGNOSIS — N760 Acute vaginitis: Secondary | ICD-10-CM

## 2011-11-28 DIAGNOSIS — Z8742 Personal history of other diseases of the female genital tract: Secondary | ICD-10-CM | POA: Insufficient documentation

## 2011-11-28 DIAGNOSIS — R102 Pelvic and perineal pain: Secondary | ICD-10-CM

## 2011-11-28 DIAGNOSIS — N870 Mild cervical dysplasia: Secondary | ICD-10-CM

## 2011-11-28 DIAGNOSIS — A499 Bacterial infection, unspecified: Secondary | ICD-10-CM

## 2011-11-28 DIAGNOSIS — B977 Papillomavirus as the cause of diseases classified elsewhere: Secondary | ICD-10-CM

## 2011-11-28 DIAGNOSIS — N912 Amenorrhea, unspecified: Secondary | ICD-10-CM

## 2011-11-28 DIAGNOSIS — N949 Unspecified condition associated with female genital organs and menstrual cycle: Secondary | ICD-10-CM

## 2011-11-28 DIAGNOSIS — B9689 Other specified bacterial agents as the cause of diseases classified elsewhere: Secondary | ICD-10-CM

## 2011-11-28 MED ORDER — CLINDAMYCIN HCL 300 MG PO CAPS: 300.0000 mg | ORAL_CAPSULE | Freq: Two times a day (BID) | ORAL | Status: AC

## 2011-11-28 NOTE — Patient Instructions (Signed)
Increase fluid intake.  Call with any worsening of pelvic pain or should you start to bleed. You may take Tylenol Extra Strength 2 tablets every 6 hours for pain as needed.

## 2011-11-28 NOTE — Progress Notes (Signed)
Patient seen in [facility] ER  11/24/11 for pelvic pain that was constant that began several hours before admission. No alleviating or aggravating factors. Ultrasound was wnl and Quantitative HCG was 57.  Now patient has stabbing random pain, several times a day. Admits to nausea but denies fever, vomiting diarrhea, dyspareunia or myalgia. Patient has a h/o gallbladder surgery.  Reviewed causes of pelvic pain: GU, gyn, GI, surgery and musculoskeletal. Denies any vaginal bleeding but was told by ER she had clue cells but is symptomatic.  O: Abdomen: BS+, soft, diffusely tender in lower quadrants but no guarding or rebound.  Pelvic: EGBUS-wnl, vagina-nml, Cervix-long/closed, uterus-NSSC, mildly tender  A: Pelvic Pain     Early Pregnant per Quantiative HCG ([redacted]w[redacted]d/LMP)     H/O Clue cells on wet prep-asymptomatic  Plan: Serial Qunatitative HCG             Cleocin 300mg  bid x 7 dayss            Perineal hygiene            Ectopic precautions

## 2011-11-30 ENCOUNTER — Other Ambulatory Visit: Payer: Self-pay

## 2011-11-30 DIAGNOSIS — N912 Amenorrhea, unspecified: Secondary | ICD-10-CM

## 2011-11-30 DIAGNOSIS — R102 Pelvic and perineal pain: Secondary | ICD-10-CM

## 2011-12-01 ENCOUNTER — Other Ambulatory Visit: Payer: Self-pay

## 2011-12-01 ENCOUNTER — Telehealth: Payer: Self-pay

## 2011-12-01 DIAGNOSIS — R102 Pelvic and perineal pain: Secondary | ICD-10-CM

## 2011-12-01 NOTE — Telephone Encounter (Signed)
Tc to pt per ep to schedule u/s . Scheduled u/s on 12-07-2011 at 3:00pm. Pt voiced understanding

## 2011-12-02 ENCOUNTER — Other Ambulatory Visit: Payer: Self-pay | Admitting: Obstetrics and Gynecology

## 2011-12-02 DIAGNOSIS — A609 Anogenital herpesviral infection, unspecified: Secondary | ICD-10-CM

## 2011-12-02 MED ORDER — VALACYCLOVIR HCL 500 MG PO TABS: 500.0000 mg | ORAL_TABLET | Freq: Two times a day (BID) | ORAL | Status: AC

## 2011-12-02 NOTE — Telephone Encounter (Signed)
TC from patient--early pregnant, current HSV outbreak.  Requests Valtrex. Offered current outbreak treatment and Rx for suppressive therapy, but patient Prefers to only treat outbreak at present due to insurance issues. RX Valtrex 500 mg po BID x 3 days sent to [facility] on [address].  To follow-up at NOB visit or prn.

## 2011-12-07 ENCOUNTER — Encounter: Payer: Self-pay | Admitting: Obstetrics and Gynecology

## 2011-12-07 ENCOUNTER — Ambulatory Visit (INDEPENDENT_AMBULATORY_CARE_PROVIDER_SITE_OTHER): Payer: Medicaid Other | Admitting: Obstetrics and Gynecology

## 2011-12-07 ENCOUNTER — Ambulatory Visit (INDEPENDENT_AMBULATORY_CARE_PROVIDER_SITE_OTHER): Payer: Medicaid Other

## 2011-12-07 ENCOUNTER — Other Ambulatory Visit: Payer: Self-pay | Admitting: Obstetrics and Gynecology

## 2011-12-07 VITALS — BP 120/70 | HR 74 | Wt 205.0 lb

## 2011-12-07 DIAGNOSIS — R102 Pelvic and perineal pain unspecified side: Secondary | ICD-10-CM

## 2011-12-07 DIAGNOSIS — E282 Polycystic ovarian syndrome: Secondary | ICD-10-CM

## 2011-12-07 DIAGNOSIS — O26899 Other specified pregnancy related conditions, unspecified trimester: Secondary | ICD-10-CM

## 2011-12-07 DIAGNOSIS — R6889 Other general symptoms and signs: Secondary | ICD-10-CM

## 2011-12-07 DIAGNOSIS — B009 Herpesviral infection, unspecified: Secondary | ICD-10-CM | POA: Insufficient documentation

## 2011-12-07 DIAGNOSIS — IMO0002 Reserved for concepts with insufficient information to code with codable children: Secondary | ICD-10-CM | POA: Insufficient documentation

## 2011-12-07 DIAGNOSIS — O9989 Other specified diseases and conditions complicating pregnancy, childbirth and the puerperium: Secondary | ICD-10-CM

## 2011-12-07 DIAGNOSIS — Z818 Family history of other mental and behavioral disorders: Secondary | ICD-10-CM

## 2011-12-07 DIAGNOSIS — N870 Mild cervical dysplasia: Secondary | ICD-10-CM

## 2011-12-07 DIAGNOSIS — N949 Unspecified condition associated with female genital organs and menstrual cycle: Secondary | ICD-10-CM

## 2011-12-07 NOTE — Progress Notes (Signed)
Pt. with pelvic pain and  + pregnancy test returns for U/S.  O: Ultrasound: 5w 0d GS, no yolk sac seen, normal ovaries with no fluid in CDS   A: SIUP  5weeks  P: Repeat U/S in 10 days for viability

## 2011-12-07 NOTE — Patient Instructions (Addendum)
Return as scheduled for f/u ultrasound in 10-14 days

## 2011-12-20 ENCOUNTER — Other Ambulatory Visit: Payer: Self-pay | Admitting: Obstetrics and Gynecology

## 2011-12-20 ENCOUNTER — Ambulatory Visit (INDEPENDENT_AMBULATORY_CARE_PROVIDER_SITE_OTHER): Payer: Self-pay

## 2011-12-20 DIAGNOSIS — O26899 Other specified pregnancy related conditions, unspecified trimester: Secondary | ICD-10-CM

## 2011-12-20 DIAGNOSIS — N949 Unspecified condition associated with female genital organs and menstrual cycle: Secondary | ICD-10-CM

## 2011-12-20 DIAGNOSIS — O9989 Other specified diseases and conditions complicating pregnancy, childbirth and the puerperium: Secondary | ICD-10-CM

## 2011-12-20 LAB — US OB TRANSVAGINAL

## 2011-12-26 ENCOUNTER — Encounter: Payer: Self-pay | Admitting: Obstetrics and Gynecology

## 2011-12-30 ENCOUNTER — Encounter (HOSPITAL_COMMUNITY): Payer: Self-pay | Admitting: Obstetrics and Gynecology

## 2011-12-30 ENCOUNTER — Inpatient Hospital Stay (HOSPITAL_COMMUNITY)
Admission: AD | Admit: 2011-12-30 | Discharge: 2011-12-30 | Disposition: A | Discharge status: Home / Self Care | Payer: Medicaid Other | Source: Ambulatory Visit | Attending: Obstetrics and Gynecology | Admitting: Obstetrics and Gynecology

## 2011-12-30 DIAGNOSIS — O2390 Unspecified genitourinary tract infection in pregnancy, unspecified trimester: Secondary | ICD-10-CM | POA: Insufficient documentation

## 2011-12-30 DIAGNOSIS — L292 Pruritus vulvae: Secondary | ICD-10-CM

## 2011-12-30 DIAGNOSIS — N390 Urinary tract infection, site not specified: Secondary | ICD-10-CM | POA: Insufficient documentation

## 2011-12-30 DIAGNOSIS — R300 Dysuria: Secondary | ICD-10-CM | POA: Insufficient documentation

## 2011-12-30 DIAGNOSIS — L293 Anogenital pruritus, unspecified: Secondary | ICD-10-CM | POA: Insufficient documentation

## 2011-12-30 DIAGNOSIS — O2340 Unspecified infection of urinary tract in pregnancy, unspecified trimester: Secondary | ICD-10-CM

## 2011-12-30 LAB — URINALYSIS, ROUTINE W REFLEX MICROSCOPIC
Bilirubin Urine: NEGATIVE
Ketones, ur: NEGATIVE mg/dL
Nitrite: NEGATIVE
Urobilinogen, UA: 0.2 mg/dL (ref 0.0–1.0)
pH: 7.5 (ref 5.0–8.0)

## 2011-12-30 LAB — WET PREP, GENITAL: Yeast Wet Prep HPF POC: NONE SEEN

## 2011-12-30 LAB — URINE MICROSCOPIC-ADD ON

## 2011-12-30 MED ORDER — NITROFURANTOIN MONOHYD MACRO 100 MG PO CAPS: 100.0000 mg | ORAL_CAPSULE | Freq: Two times a day (BID) | ORAL | Status: AC

## 2011-12-30 NOTE — MAU Provider Note (Signed)
History    CSN: [id number]  Arrival date and time: 12/30/11 1406   First Provider Initiated Contact with Patient 12/30/11 1427      Chief Complaint  Patient presents with  . Dysuria  . Vaginal Discharge   HPI Pt presents at [redacted]wks gestation with a 2 day hx of dysuria, urinary frequency and urgency as well as ext genitalia itching and burning.  She denies fever.  She has had bilateral lower back pain which has increased over the past 2 days as well.  She did have onset of nausea this AM but no vomiting.  She has been seen at [facility] for pregnancy evaluation and [place] with confirmation of intrauterine pregnancy.  She was also diagnosed with BV at her last visit but she states she did not take the medication as prescribed.  She denies any vaginal bleeding or cramping.  She does report HSV infection 3 weeks ago which she treated with Valtrex but states this does not feel like HSV infection.  OB History    Grav Para Term Preterm Abortions TAB SAB Ect Mult Living   3 2 2       2       Past Medical History  Diagnosis Date  . Abnormal Pap smear   . Polycystic ovarian syndrome 05/08/05  . 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 CIN I (cervical intraepithelial neoplasia I)   . Herpes simplex without mention of complication   . FHx: suicide   . H/O amenorrhea 02/19/04  . Vaginitis and vulvovaginitis 04/29/04  . Bacterial vaginosis 04/29/04  . Dysuria 04/04/05  . Irregular periods/menstrual cycles   . Hx: UTI (urinary tract infection)   . Dyspareunia 05/02/10  . Herpes simplex type 2 infection     "last outbreak 3 weeks ago"    Past Surgical History  Procedure Date  . Cholecystectomy, laparoscopic 2010  . Wisdom tooth extraction 2001  . Tonsillectomy 2006    Family History  Problem Relation Age of Onset  . Heart disease Father     heart attack in oct.  lm  . Cancer Maternal Uncle     bone cancer  . Cancer Maternal Grandmother     ovarian cancer  . Suicidality Mother     History  Substance  Use Topics  . Smoking status: Never Smoker   . Smokeless tobacco: Not on file  . Alcohol Use: 0.0 oz/week    0 Glasses of wine per week     sometimes    Allergies:  Allergies  Allergen Reactions  . Amoxicillin Rash    Prescriptions prior to admission  Medication Sig Dispense Refill  . calcium carbonate (TUMS - DOSED IN MG ELEMENTAL CALCIUM) 500 MG chewable tablet Chew 1 tablet by mouth 3 (three) times daily as needed. For indigestion      . Prenatal Vit-Fe Fumarate-FA (PRENATAL COMPLETE) 14-0.4 MG TABS Take 1 tablet by mouth daily.  60 each  5  . valACYclovir (VALTREX) 500 MG tablet Take 500 mg by mouth 2 (two) times daily. Take twice daily for three days for outbreaks        Review of Systems  Constitutional: Negative.   HENT: Negative.   Eyes: Negative.   Respiratory: Negative.   Cardiovascular: Negative.   Gastrointestinal: Positive for nausea.  Genitourinary: Positive for dysuria, urgency and frequency.  Musculoskeletal: Positive for back pain.  Skin: Positive for itching.  Neurological: Negative.   Endo/Heme/Allergies: Negative.   Psychiatric/Behavioral: Negative.    Physical Exam  Blood pressure 138/76, pulse 96, temperature 97 F (36.1 C), temperature source Oral, resp. rate 18, height 5\' 10"  (1.778 m), weight 93.532 kg (206 lb 3.2 oz), last menstrual period 10/06/2011.  Physical Exam  Constitutional: She is oriented to person, place, and time. She appears well-developed and well-nourished.  HENT:  Head: Normocephalic and atraumatic.  Right Ear: External ear normal.  Left Ear: External ear normal.  Nose: Nose normal.  Eyes: Conjunctivae are normal. Pupils are equal, round, and reactive to light.  Neck: Normal range of motion. Neck supple. No thyromegaly present.  Cardiovascular: Normal rate, regular rhythm and intact distal pulses.   Respiratory: Effort normal and breath sounds normal.  GI: Soft. Bowel sounds are normal.  Genitourinary: Vagina normal and  uterus normal.       Ext genitalia with sl redness right inferior labia c/w excoriation.  BUS neg.  No HSV lesions or other lesions noted.  Vagina with scant white discharge in vault.  No cervical lesions noted.  Neg CMT.  Ut approx 9wk size, mobile NT.    Musculoskeletal: Normal range of motion.  Neurological: She is alert and oriented to person, place, and time. She has normal reflexes.  Skin: Skin is warm and dry.  Psychiatric: She has a normal mood and affect. Her behavior is normal.    MAU Course  Procedures Results for orders placed during the hospital encounter of 12/30/11 (from the past 24 hour(s))  URINALYSIS, ROUTINE W REFLEX MICROSCOPIC     Status: Abnormal   Collection Time   12/30/11  2:10 PM      Component Value Range   Color, Urine YELLOW  YELLOW    APPearance HAZY (*) CLEAR    Specific Gravity, Urine 1.010  1.005 - 1.030    pH 7.5  5.0 - 8.0    Glucose, UA NEGATIVE  NEGATIVE (mg/dL)   Hgb urine dipstick SMALL (*) NEGATIVE    Bilirubin Urine NEGATIVE  NEGATIVE    Ketones, ur NEGATIVE  NEGATIVE (mg/dL)   Protein, ur NEGATIVE  NEGATIVE (mg/dL)   Urobilinogen, UA 0.2  0.0 - 1.0 (mg/dL)   Nitrite NEGATIVE  NEGATIVE    Leukocytes, UA NEGATIVE  NEGATIVE   URINE MICROSCOPIC-ADD ON     Status: Abnormal   Collection Time   12/30/11  2:10 PM      Component Value Range   Squamous Epithelial / LPF RARE  RARE    RBC / HPF 3-6  <3 (RBC/hpf)   Bacteria, UA FEW (*) RARE    Urine-Other AMORPHOUS URATES/PHOSPHATES    WET PREP, GENITAL     Status: Abnormal   Collection Time   12/30/11  2:31 PM      Component Value Range   Yeast Wet Prep HPF POC NONE SEEN  NONE SEEN    Trich, Wet Prep NONE SEEN  NONE SEEN    Clue Cells Wet Prep HPF POC NONE SEEN  NONE SEEN    WBC, Wet Prep HPF POC MODERATE (*) NONE SEEN     Assessment and Plan  IUP at 9wks UTI External genitalia itching with neg wet prep  Rx Macrobid, #14, no RF given Urine sent for culture. Irritant avoidance d/w pt and  rec OTC 1% hydrocorisone cream/ointment qid x 3-4 days for itching then discontinue.   F/U at [facility] on 01/10/12 as previously scheduled or prior if unresolved or worsening symptoms.   [name] Bass O. 12/30/2011, 2:41 PM

## 2011-12-30 NOTE — Discharge Instructions (Signed)
Use over the counter hydrocortisone cream to affected area 4 times daily for 3-4 days then discontinue.  Call the office and return if increased or persistent itching.

## 2011-12-30 NOTE — MAU Note (Signed)
"  For the past 2 days I have had increased vaginal d/c and irritation.  Today I noticed increased frequency, urgency, and some burning with urination.  Not as much burning now.  I'm feeling a little nauseated, but I'm not sure if it's from normal pregnancy stuff or how I'm feeling now."

## 2012-01-01 LAB — GC/CHLAMYDIA PROBE AMP, GENITAL
Chlamydia, DNA Probe: NEGATIVE
GC Probe Amp, Genital: NEGATIVE

## 2012-01-10 ENCOUNTER — Encounter: Payer: Self-pay | Admitting: Obstetrics and Gynecology

## 2012-01-12 ENCOUNTER — Telehealth: Payer: Self-pay | Admitting: Obstetrics and Gynecology

## 2012-01-12 NOTE — Telephone Encounter (Signed)
Laura/epic °

## 2012-01-17 ENCOUNTER — Encounter: Payer: Self-pay | Admitting: Obstetrics and Gynecology

## 2012-01-17 ENCOUNTER — Ambulatory Visit (INDEPENDENT_AMBULATORY_CARE_PROVIDER_SITE_OTHER): Payer: Self-pay | Admitting: Obstetrics and Gynecology

## 2012-01-17 VITALS — BP 106/78 | Wt 207.0 lb

## 2012-01-17 DIAGNOSIS — B009 Herpesviral infection, unspecified: Secondary | ICD-10-CM

## 2012-01-17 DIAGNOSIS — N390 Urinary tract infection, site not specified: Secondary | ICD-10-CM

## 2012-01-17 DIAGNOSIS — Z3490 Encounter for supervision of normal pregnancy, unspecified, unspecified trimester: Secondary | ICD-10-CM | POA: Insufficient documentation

## 2012-01-17 DIAGNOSIS — Z8742 Personal history of other diseases of the female genital tract: Secondary | ICD-10-CM

## 2012-01-17 DIAGNOSIS — K219 Gastro-esophageal reflux disease without esophagitis: Secondary | ICD-10-CM

## 2012-01-17 MED ORDER — PANTOPRAZOLE SODIUM 20 MG PO TBEC: 20.0000 mg | DELAYED_RELEASE_TABLET | Freq: Every day | ORAL | Status: DC

## 2012-01-17 NOTE — Progress Notes (Signed)
  Subjective:    [name] Bass is being seen today for her first obstetrical visit.  This is not a planned pregnancy. She is at [redacted]w[redacted]d gestation. Her obstetrical history is significant for obesity and clsely spaced pregnancies. Relationship with FOB: significant other, living together. Patient does intend to breast feed. Pregnancy history fully reviewed.  Patient reports headache, nausea, no bleeding, no cramping, no leaking and occassional epigastric pain with any food intake.  Review of Systems:   Review of Systems  Constitutional: Negative.   HENT: Negative.   Eyes: Negative.   Respiratory: Negative.   Cardiovascular: Negative.   Gastrointestinal: Positive for nausea and abdominal pain.  Genitourinary: Negative.   Musculoskeletal: Negative.   Skin: Negative.   Neurological: Negative.   Psychiatric/Behavioral: Negative.     Objective:     BP 106/78  Wt 207 lb (93.895 kg)  LMP 10/06/2011  Breastfeeding? Unknown Physical Exam  Constitutional: She is oriented to person, place, and time. She appears well-developed and well-nourished.  HENT:  Head: Normocephalic and atraumatic.  Eyes: Conjunctivae and EOM are normal.  Neck: Normal range of motion. Neck supple. No thyromegaly present.  Cardiovascular: Normal rate and regular rhythm.   Respiratory: Effort normal and breath sounds normal. She has no wheezes. She has no rales.  GI: Soft. She exhibits no distension. There is no tenderness.  Genitourinary: Vagina normal.       Uterus 10-12 weeks size.  FHT 160  Musculoskeletal: Normal range of motion.  Neurological: She is alert and oriented to person, place, and time.  Skin: Skin is warm and dry.    Exam    Assessment:    Pregnancy: [id number] Patient Active Problem List  Diagnoses  . Lower abdominal pain  . High risk HPV infection  . History of PCOS  . Cervical intraepithelial neoplasia I  . HSV (herpes simplex virus) anogenital infection  . Abnormal Pap smear   . Polycystic ovarian syndrome  . CIN I (cervical intraepithelial neoplasia I)  . Herpes simplex without mention of complication  . FHx: suicide  . Normal pregnancy       Plan:     Initial labs drawn. Prenatal vitamins. Problem list reviewed and updated. Requests quad screen . Role of ultrasound in pregnancy discussed; fetal survey: to be ordered at 18-20 wks. Amniocentesis discussed: not indicated. Follow up in 4 weeks. 80% of 30 min visit spent on counseling and coordination of care.  Urine C&S as TOC.  GC AND CHL done at 12/30/11 MAU visit.   Arlenne Kimbley P 01/17/2012

## 2012-01-18 LAB — PRENATAL PANEL VII
Antibody Screen: NEGATIVE
Basophils Absolute: 0 10*3/uL (ref 0.0–0.1)
Basophils Relative: 0 % (ref 0–1)
Eosinophils Absolute: 0.2 10*3/uL (ref 0.0–0.7)
Eosinophils Relative: 2 % (ref 0–5)
HCT: 37.4 % (ref 36.0–46.0)
MCH: 30.7 pg (ref 26.0–34.0)
MCHC: 34.8 g/dL (ref 30.0–36.0)
MCV: 88.4 fL (ref 78.0–100.0)
Monocytes Absolute: 0.6 10*3/uL (ref 0.1–1.0)
Monocytes Relative: 5 % (ref 3–12)
Neutro Abs: 8.9 10*3/uL — ABNORMAL HIGH (ref 1.7–7.7)
RDW: 12.6 % (ref 11.5–15.5)
Rh Type: POSITIVE

## 2012-01-18 MED ORDER — VALACYCLOVIR HCL 500 MG PO TABS: 500.0000 mg | ORAL_TABLET | Freq: Two times a day (BID) | ORAL | Status: DC

## 2012-01-18 NOTE — Telephone Encounter (Signed)
Refill valtrex sent in to [facility]  [address]. Pt notified.  ld

## 2012-01-18 NOTE — Telephone Encounter (Signed)
Addended by: Larwance Rote on: 01/18/2012 09:05 AM   Modules accepted: Orders

## 2012-01-19 LAB — CULTURE, OB URINE
Colony Count: NO GROWTH
Organism ID, Bacteria: NO GROWTH

## 2012-01-22 ENCOUNTER — Encounter: Payer: Self-pay | Admitting: Obstetrics and Gynecology

## 2012-02-15 ENCOUNTER — Ambulatory Visit (INDEPENDENT_AMBULATORY_CARE_PROVIDER_SITE_OTHER): Payer: Medicaid Other | Admitting: Obstetrics and Gynecology

## 2012-02-15 VITALS — BP 104/70 | Wt 210.0 lb

## 2012-02-15 DIAGNOSIS — Z331 Pregnant state, incidental: Secondary | ICD-10-CM

## 2012-02-15 NOTE — Progress Notes (Signed)
[redacted]w[redacted]d Frequent nausea, some relief w zofran, declines to use phenergan rec B6 supplement, sm frequent meals No other c/o Anat scan and quad screen [place]

## 2012-02-15 NOTE — Patient Instructions (Signed)
Hyperemesis Gravidarum  Hyperemesis gravidarum is a severe form of nausea and vomiting that happens during pregnancy. Hyperemesis is worse than morning sickness. It may cause a woman to have nausea or vomiting all day for many days. It may keep a woman from eating and drinking enough food and liquids. Hyperemesis usually occurs during the first half (the first 20 weeks) of pregnancy. It often goes away once a woman is in her second half of pregnancy. However, sometimes hyperemesis continues through an entire pregnancy.   CAUSES   The cause of this condition is not completely known but is thought to be due to changes in the body's hormones when pregnant. It could be the high level of the pregnancy hormone or an increase in estrogen in the body.   SYMPTOMS    Severe nausea and vomiting.   Nausea that does not go away.   Vomiting that does not allow you to keep any food down.   Weight loss and body fluid loss (dehydration).   Having no desire to eat or not liking food you have previously enjoyed.  DIAGNOSIS   Your caregiver may ask you about your symptoms. Your caregiver may also order blood tests and urine tests to make sure something else is not causing the problem.   TREATMENT   You may only need medicine to control the problem. If medicines do not control the nausea and vomiting, you will be treated in the hospital to prevent dehydration, acidosis, weight loss, and changes in the electrolytes in your body that may harm the unborn baby (fetus). You may need intravenous (IV) fluids.   HOME CARE INSTRUCTIONS    Take all medicine as directed by your caregiver.   Try eating a couple of dry crackers or toast in the morning before getting out of bed.   Avoid foods and smells that upset your stomach.   Avoid fatty and spicy foods. Eat 5 to 6 small meals a day.   Do not drink when eating meals. Drink between meals.   For snacks, eat high protein foods, such as cheese. Eat or suck on things that have ginger in  them. Ginger helps nausea.   Avoid food preparation. The smell of food can spoil your appetite.   Avoid iron pills and iron in your multivitamins until after 3 to 4 months of being pregnant.  SEEK MEDICAL CARE IF:    Your abdominal pain increases since the last time you saw your caregiver.   You have a severe headache.   You develop vision problems.   You feel you are losing weight.  SEEK IMMEDIATE MEDICAL CARE IF:    You are unable to keep fluids down.   You vomit blood.   You have constant nausea and vomiting.   You have a fever.   You have excessive weakness, dizziness, fainting, or extreme thirst.  MAKE SURE YOU:    Understand these instructions.   Will watch your condition.   Will get help right away if you are not doing well or get worse.  Document Released: 08/07/2005 Document Revised: 07/27/2011 Document Reviewed: 11/07/2010  [employer] Patient Information 2012 [employer].

## 2012-02-19 ENCOUNTER — Telehealth: Payer: Self-pay | Admitting: Obstetrics and Gynecology

## 2012-02-19 NOTE — Telephone Encounter (Signed)
Denies UTI s/s. Discussed OTC antidiarrhea meds if diarrhea resumes, clear liquids, push fluids x 24 hours, frequent voids, report if symptoms not improving or worsen. Lavera Guise, CNM

## 2012-03-03 NOTE — Telephone Encounter (Signed)
A user error has taken place: encounter opened in error, closed for administrative reasons.

## 2012-03-14 ENCOUNTER — Other Ambulatory Visit: Payer: Self-pay | Admitting: Obstetrics and Gynecology

## 2012-03-14 ENCOUNTER — Ambulatory Visit (INDEPENDENT_AMBULATORY_CARE_PROVIDER_SITE_OTHER): Payer: Medicaid Other

## 2012-03-14 ENCOUNTER — Encounter: Payer: Self-pay | Admitting: Obstetrics and Gynecology

## 2012-03-14 ENCOUNTER — Ambulatory Visit (INDEPENDENT_AMBULATORY_CARE_PROVIDER_SITE_OTHER): Payer: Medicaid Other | Admitting: Obstetrics and Gynecology

## 2012-03-14 VITALS — BP 110/70 | Wt 213.0 lb

## 2012-03-14 DIAGNOSIS — Z331 Pregnant state, incidental: Secondary | ICD-10-CM

## 2012-03-14 DIAGNOSIS — Z3689 Encounter for other specified antenatal screening: Secondary | ICD-10-CM

## 2012-03-14 MED ORDER — PNV PRENATAL PLUS MULTIVITAMIN 27-1 MG PO TABS: 1.0000 | ORAL_TABLET | Freq: Every day | ORAL | Status: DC

## 2012-03-14 NOTE — Progress Notes (Signed)
Pt with constant lower back pain.  Nothing makes it better or worse [place] S=D cx 3.86 cm  Vertex presentation FLPK 4.3cm Normal anatomy Pt desires rx for prenantal pus vitamins rx given Pt givne info on back paina nd pregnancy QUAD screen today

## 2012-03-14 NOTE — Patient Instructions (Signed)

## 2012-03-15 LAB — AFP, QUAD SCREEN
AFP: 51.8 IU/mL
Age Alone: 1:922 {titer}
Curr Gest Age: 18.6 wks.days
Down Syndrome Scr Risk Est: 1:19900 {titer}
HCG, Total: 22082 m[IU]/mL
INH: 147.5 pg/mL
Interpretation-AFP: NEGATIVE
MoM for INH: 1.01
MoM for hCG: 1.72
Open Spina bifida: NEGATIVE
Tri 18 Scr Risk Est: NEGATIVE
uE3 Mom: 1.34

## 2012-03-17 ENCOUNTER — Telehealth: Payer: Self-pay | Admitting: Obstetrics and Gynecology

## 2012-03-17 ENCOUNTER — Other Ambulatory Visit: Payer: Self-pay | Admitting: Obstetrics and Gynecology

## 2012-03-17 ENCOUNTER — Inpatient Hospital Stay (HOSPITAL_COMMUNITY)
Admission: AD | Admit: 2012-03-17 | Discharge: 2012-03-17 | Disposition: A | Discharge status: Home / Self Care | Payer: Medicaid Other | Source: Ambulatory Visit | Attending: Obstetrics and Gynecology | Admitting: Obstetrics and Gynecology

## 2012-03-17 DIAGNOSIS — O2390 Unspecified genitourinary tract infection in pregnancy, unspecified trimester: Secondary | ICD-10-CM

## 2012-03-17 DIAGNOSIS — N760 Acute vaginitis: Secondary | ICD-10-CM

## 2012-03-17 DIAGNOSIS — N949 Unspecified condition associated with female genital organs and menstrual cycle: Secondary | ICD-10-CM

## 2012-03-17 DIAGNOSIS — R350 Frequency of micturition: Secondary | ICD-10-CM | POA: Insufficient documentation

## 2012-03-17 DIAGNOSIS — R109 Unspecified abdominal pain: Secondary | ICD-10-CM | POA: Insufficient documentation

## 2012-03-17 DIAGNOSIS — N390 Urinary tract infection, site not specified: Secondary | ICD-10-CM | POA: Insufficient documentation

## 2012-03-17 LAB — URINALYSIS, ROUTINE W REFLEX MICROSCOPIC
Bilirubin Urine: NEGATIVE
Glucose, UA: NEGATIVE mg/dL
Ketones, ur: NEGATIVE mg/dL
Leukocytes, UA: NEGATIVE
Nitrite: NEGATIVE
Protein, ur: NEGATIVE mg/dL
pH: 7.5 (ref 5.0–8.0)

## 2012-03-17 LAB — URINE MICROSCOPIC-ADD ON

## 2012-03-17 LAB — WET PREP, GENITAL: Trich, Wet Prep: NONE SEEN

## 2012-03-17 MED ORDER — METRONIDAZOLE 500 MG PO TABS: 500.0000 mg | ORAL_TABLET | Freq: Two times a day (BID) | ORAL | Status: AC

## 2012-03-17 MED ORDER — IBUPROFEN 800 MG PO TABS
800.0000 mg | ORAL_TABLET | Freq: Once | ORAL | Status: AC
  Administered 2012-03-17: 800 mg via ORAL
  Filled 2012-03-17: qty 1

## 2012-03-17 MED ORDER — NITROFURANTOIN MONOHYD MACRO 100 MG PO CAPS: 100.0000 mg | ORAL_CAPSULE | Freq: Two times a day (BID) | ORAL | Status: AC

## 2012-03-17 NOTE — Telephone Encounter (Signed)
TC from pt c/o bladder/low pelvic pressure and urinary frequency since yesterday, requests ABX. Rec pt come to MAU for eval. Pt agrees will come after getting childcare, Instructed to PO hydrate, may take AZO. Orders placed for UA and cx.

## 2012-03-17 NOTE — MAU Provider Note (Signed)
History     CSN: [id number]  Arrival date and time: 03/17/12 1051   None     Chief Complaint  Patient presents with  . Abdominal Pain   HPI Comments: Pt is a G3P2 at [redacted]w[redacted]d w c/o intermittent lower pelvic pressure/pain that started yesterday. She denies any VB, LOF, some white d/c. Has not tried any OTC tx. Also c/o urinary frequency.    Abdominal Pain Associated symptoms include frequency. Pertinent negatives include no dysuria, fever, nausea or vomiting.      Past Medical History  Diagnosis Date  . Abnormal Pap smear   . Polycystic ovarian syndrome 05/08/05  . 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 CIN I (cervical intraepithelial neoplasia I)   . Herpes simplex without mention of complication   . FHx: suicide   . H/O amenorrhea 02/19/04  . Vaginitis and vulvovaginitis 04/29/04  . Bacterial vaginosis 04/29/04  . Dysuria 04/04/05  . Irregular periods/menstrual cycles   . Hx: UTI (urinary tract infection)   . Dyspareunia 05/02/10  . Herpes simplex type 2 infection     "last outbreak 3 weeks ago"    Past Surgical History  Procedure Date  . Cholecystectomy, laparoscopic 2010  . Wisdom tooth extraction 2001  . Tonsillectomy 2006    Family History  Problem Relation Age of Onset  . Heart disease Father     heart attack in oct.  lm  . Cancer Maternal Uncle     bone cancer  . Cancer Maternal Grandmother     ovarian cancer  . Suicidality Mother     History  Substance Use Topics  . Smoking status: Never Smoker   . Smokeless tobacco: Not on file  . Alcohol Use: No     sometimes    Allergies:  Allergies  Allergen Reactions  . Amoxicillin Rash    Prescriptions prior to admission  Medication Sig Dispense Refill  . calcium carbonate (TUMS - DOSED IN MG ELEMENTAL CALCIUM) 500 MG chewable tablet Chew 1 tablet by mouth 3 (three) times daily as needed. For indigestion      . FOLIC ACID PO Take 1 tablet by mouth daily.      . pantoprazole (PROTONIX) 20 MG tablet Take 1  tablet (20 mg total) by mouth daily.  30 tablet  4    Review of Systems  Constitutional: Negative for fever.  Gastrointestinal: Positive for abdominal pain. Negative for nausea and vomiting.  Genitourinary: Positive for frequency. Negative for dysuria and flank pain.  All other systems reviewed and are negative.   Physical Exam   Blood pressure 134/78, pulse 97, temperature 97.5 F (36.4 C), temperature source Oral, resp. rate 16, height 5\' 10"  (1.778 m), weight 210 lb (95.255 kg), last menstrual period 10/06/2011, unknown if currently breastfeeding.  Physical Exam  Nursing note and vitals reviewed. Constitutional: She is oriented to person, place, and time. She appears well-developed and well-nourished. No distress.  HENT:  Head: Normocephalic.  Eyes: Pupils are equal, round, and reactive to light.  Neck: Normal range of motion.  Cardiovascular: Normal rate.   Respiratory: Effort normal.  GI: Soft. She exhibits no distension. There is no tenderness. There is no rebound and no guarding.       FH aga Uterus soft, NT  Genitourinary: Vaginal discharge found.       Cervix cl/th/high Pink, no HSV lesions Mod amt clear/white d/c   Musculoskeletal: Normal range of motion. She exhibits no edema.  Neurological: She is alert and oriented  to person, place, and time.  Skin: Skin is warm and dry.  Psychiatric: She has a normal mood and affect. Her behavior is normal.   Results for orders placed during the hospital encounter of 03/17/12 (from the past 24 hour(s))  URINALYSIS, ROUTINE W REFLEX MICROSCOPIC     Status: Abnormal   Collection Time   03/17/12 10:55 AM      Component Value Range   Color, Urine YELLOW  YELLOW   APPearance CLEAR  CLEAR   Specific Gravity, Urine 1.015  1.005 - 1.030   pH 7.5  5.0 - 8.0   Glucose, UA NEGATIVE  NEGATIVE mg/dL   Hgb urine dipstick MODERATE (*) NEGATIVE   Bilirubin Urine NEGATIVE  NEGATIVE   Ketones, ur NEGATIVE  NEGATIVE mg/dL   Protein, ur  NEGATIVE  NEGATIVE mg/dL   Urobilinogen, UA 0.2  0.0 - 1.0 mg/dL   Nitrite NEGATIVE  NEGATIVE   Leukocytes, UA NEGATIVE  NEGATIVE  URINE MICROSCOPIC-ADD ON     Status: Normal   Collection Time   03/17/12 10:55 AM      Component Value Range   Squamous Epithelial / LPF RARE  RARE   WBC, UA 0-2  <3 WBC/hpf   RBC / HPF 7-10  <3 RBC/hpf   Urine-Other MUCOUS PRESENT    WET PREP, GENITAL     Status: Abnormal   Collection Time   03/17/12 11:30 AM      Component Value Range   Yeast Wet Prep HPF POC NONE SEEN  NONE SEEN   Trich, Wet Prep NONE SEEN  NONE SEEN   Clue Cells Wet Prep HPF POC FEW (*) NONE SEEN   WBC, Wet Prep HPF POC FEW (*) NONE SEEN     MAU Course  Procedures   Assessment and Plan  [redacted]w[redacted]d IUP +clue cells Hematuria, presumptive UTI  Will give motrin PO,  macrobid RX Flagyl RX  D/C home - inc PO hydration Will send UA for cx   Jewelianna Pancoast M 03/17/2012, 12:02 PM

## 2012-03-17 NOTE — MAU Note (Signed)
Patient brought directly to room 7 from lobby, c/o lower abdominal pressure.

## 2012-03-18 LAB — URINE CULTURE

## 2012-03-18 LAB — US OB COMP + 14 WK

## 2012-03-19 LAB — GC/CHLAMYDIA PROBE AMP, GENITAL
Chlamydia, DNA Probe: NEGATIVE
GC Probe Amp, Genital: NEGATIVE

## 2012-03-21 ENCOUNTER — Telehealth: Payer: Self-pay | Admitting: Obstetrics and Gynecology

## 2012-03-21 NOTE — Telephone Encounter (Signed)
Spoke with Corrie Dandy and advised me to tell pt to keep taking medication. And this is pt's 3 pregnancy . Spoke with pt and advised pt to keep taking rx as directed and make sure she is drinking fluid with the meds. Advised pt to take tylenol prn for any pain. Advised pt to call back if still having some pressure . Pt has a app with Chip Boer on 04/11/12 . Pt's voice understanding . BTCMA

## 2012-03-21 NOTE — Telephone Encounter (Signed)
Spoke with pt rgd msg. Pt stated that she was seen at MAU on Sunday and seen Shelly. Pt was treated for bv and an UTI and was given rx for macrobid 100 mg 2 times daily and flagyl 500 mg 1 tab 2 times daily. Pt stated she is still having pressure . Advised pt would consult with the midwife on call and call pt back. Pt's voice understanding . bt cma

## 2012-03-23 ENCOUNTER — Inpatient Hospital Stay (HOSPITAL_COMMUNITY)
Admission: AD | Admit: 2012-03-23 | Discharge: 2012-03-29 | DRG: 781 | Disposition: A | Discharge status: Home / Self Care | Payer: Medicaid Other | Source: Ambulatory Visit | Attending: Obstetrics and Gynecology | Admitting: Obstetrics and Gynecology

## 2012-03-23 ENCOUNTER — Inpatient Hospital Stay (HOSPITAL_COMMUNITY): Payer: Medicaid Other

## 2012-03-23 DIAGNOSIS — N1330 Unspecified hydronephrosis: Secondary | ICD-10-CM | POA: Diagnosis present

## 2012-03-23 DIAGNOSIS — N201 Calculus of ureter: Secondary | ICD-10-CM | POA: Diagnosis present

## 2012-03-23 DIAGNOSIS — N200 Calculus of kidney: Secondary | ICD-10-CM | POA: Diagnosis present

## 2012-03-23 DIAGNOSIS — R1031 Right lower quadrant pain: Secondary | ICD-10-CM

## 2012-03-23 DIAGNOSIS — O26839 Pregnancy related renal disease, unspecified trimester: Principal | ICD-10-CM | POA: Diagnosis present

## 2012-03-23 LAB — CBC WITH DIFFERENTIAL/PLATELET
Basophils Relative: 0 % (ref 0–1)
HCT: 35.9 % — ABNORMAL LOW (ref 36.0–46.0)
Hemoglobin: 12.1 g/dL (ref 12.0–15.0)
Lymphocytes Relative: 12 % (ref 12–46)
Monocytes Absolute: 0.7 10*3/uL (ref 0.1–1.0)
Monocytes Relative: 5 % (ref 3–12)
Neutro Abs: 12.8 10*3/uL — ABNORMAL HIGH (ref 1.7–7.7)
Neutrophils Relative %: 82 % — ABNORMAL HIGH (ref 43–77)
RBC: 3.97 MIL/uL (ref 3.87–5.11)
WBC: 15.6 10*3/uL — ABNORMAL HIGH (ref 4.0–10.5)

## 2012-03-23 LAB — URINALYSIS, ROUTINE W REFLEX MICROSCOPIC
Bilirubin Urine: NEGATIVE
Ketones, ur: 15 mg/dL — AB
Nitrite: NEGATIVE
Protein, ur: NEGATIVE mg/dL
Specific Gravity, Urine: 1.02 (ref 1.005–1.030)
Urobilinogen, UA: 0.2 mg/dL (ref 0.0–1.0)

## 2012-03-23 LAB — COMPREHENSIVE METABOLIC PANEL
AST: 15 U/L (ref 0–37)
Albumin: 3.3 g/dL — ABNORMAL LOW (ref 3.5–5.2)
Alkaline Phosphatase: 77 U/L (ref 39–117)
BUN: 7 mg/dL (ref 6–23)
CO2: 22 mEq/L (ref 19–32)
Chloride: 101 mEq/L (ref 96–112)
Creatinine, Ser: 0.5 mg/dL (ref 0.50–1.10)
GFR calc non Af Amer: >90 mL/min (ref >90)
Potassium: 3.4 mEq/L — ABNORMAL LOW (ref 3.5–5.1)
Total Bilirubin: 0.2 mg/dL — ABNORMAL LOW (ref 0.3–1.2)

## 2012-03-23 LAB — URINE MICROSCOPIC-ADD ON

## 2012-03-23 LAB — LIPASE, BLOOD: Lipase: 25 U/L (ref 11–59)

## 2012-03-23 LAB — WET PREP, GENITAL: Yeast Wet Prep HPF POC: NONE SEEN

## 2012-03-23 LAB — AMYLASE: Amylase: 51 U/L (ref 0–105)

## 2012-03-23 MED ORDER — DIPHENHYDRAMINE HCL 12.5 MG/5ML PO ELIX: 12.5000 mg | ORAL_SOLUTION | Freq: Four times a day (QID) | ORAL | Status: DC | PRN

## 2012-03-23 MED ORDER — LACTATED RINGERS IV BOLUS (SEPSIS)
500.0000 mL | Freq: Once | INTRAVENOUS | Status: AC
  Administered 2012-03-23: 1000 mL via INTRAVENOUS
  Administered 2012-03-23: 500 mL via INTRAVENOUS

## 2012-03-23 MED ORDER — NALOXONE HCL 0.4 MG/ML IJ SOLN: 0.4000 mg | INTRAMUSCULAR | Status: DC | PRN

## 2012-03-23 MED ORDER — ZOLPIDEM TARTRATE 5 MG PO TABS: 5.0000 mg | ORAL_TABLET | Freq: Every evening | ORAL | Status: DC | PRN

## 2012-03-23 MED ORDER — ONDANSETRON HCL 4 MG/2ML IJ SOLN: 4.0000 mg | Freq: Four times a day (QID) | INTRAMUSCULAR | Status: DC | PRN

## 2012-03-23 MED ORDER — MORPHINE SULFATE 10 MG/ML IJ SOLN
10.0000 mg | Freq: Once | INTRAMUSCULAR | Status: AC
  Administered 2012-03-23: 10 mg via INTRAMUSCULAR
  Filled 2012-03-23: qty 1

## 2012-03-23 MED ORDER — DIPHENHYDRAMINE HCL 50 MG/ML IJ SOLN
12.5000 mg | Freq: Four times a day (QID) | INTRAMUSCULAR | Status: DC | PRN
  Administered 2012-03-24 – 2012-03-28 (×2): 12.5 mg via INTRAVENOUS
  Filled 2012-03-23 (×2): qty 1

## 2012-03-23 MED ORDER — OXYCODONE-ACETAMINOPHEN 5-325 MG PO TABS
1.0000 | ORAL_TABLET | ORAL | Status: DC | PRN
  Administered 2012-03-28 – 2012-03-29 (×3): 2 via ORAL
  Filled 2012-03-23 (×3): qty 2

## 2012-03-23 MED ORDER — ONDANSETRON HCL 4 MG PO TABS: 4.0000 mg | ORAL_TABLET | Freq: Four times a day (QID) | ORAL | Status: DC | PRN

## 2012-03-23 MED ORDER — METRONIDAZOLE 500 MG PO TABS
500.0000 mg | ORAL_TABLET | Freq: Two times a day (BID) | ORAL | Status: AC
  Administered 2012-03-23: 500 mg via ORAL
  Filled 2012-03-23: qty 1

## 2012-03-23 MED ORDER — BUTORPHANOL TARTRATE 1 MG/ML IJ SOLN
1.0000 mg | Freq: Once | INTRAMUSCULAR | Status: AC
  Administered 2012-03-23: 1 mg via INTRAVENOUS
  Filled 2012-03-23: qty 1

## 2012-03-23 MED ORDER — ACETAMINOPHEN 325 MG PO TABS: 650.0000 mg | ORAL_TABLET | ORAL | Status: DC | PRN

## 2012-03-23 MED ORDER — PRENATAL MULTIVITAMIN CH
1.0000 | ORAL_TABLET | Freq: Every day | ORAL | Status: DC
  Administered 2012-03-26 – 2012-03-29 (×3): 1 via ORAL
  Filled 2012-03-23 (×5): qty 1

## 2012-03-23 MED ORDER — SODIUM CHLORIDE 0.9 % IJ SOLN: 9.0000 mL | INTRAMUSCULAR | Status: DC | PRN

## 2012-03-23 MED ORDER — ONDANSETRON HCL 4 MG/2ML IJ SOLN
4.0000 mg | Freq: Four times a day (QID) | INTRAMUSCULAR | Status: DC | PRN
  Administered 2012-03-24: 4 mg via INTRAVENOUS
  Filled 2012-03-23: qty 2

## 2012-03-23 MED ORDER — NITROFURANTOIN MONOHYD MACRO 100 MG PO CAPS
100.0000 mg | ORAL_CAPSULE | Freq: Once | ORAL | Status: AC
  Administered 2012-03-24: 100 mg via ORAL
  Filled 2012-03-23: qty 1

## 2012-03-23 MED ORDER — HYDROMORPHONE 0.3 MG/ML IV SOLN
INTRAVENOUS | Status: DC
  Administered 2012-03-23: 0.5 mg via INTRAVENOUS
  Administered 2012-03-23: 21:00:00 via INTRAVENOUS
  Administered 2012-03-23: 0.5 mg via INTRAVENOUS
  Administered 2012-03-23: 2.39 mg via INTRAVENOUS
  Administered 2012-03-24 (×2): 0.5 mg via INTRAVENOUS
  Administered 2012-03-24: 1.2 mg via INTRAVENOUS
  Administered 2012-03-24: 0.6 mg via INTRAVENOUS
  Administered 2012-03-24: 22 mL via INTRAVENOUS
  Administered 2012-03-24: 8.63 mL via INTRAVENOUS
  Administered 2012-03-24: 1.2 mg via INTRAVENOUS
  Administered 2012-03-24: 4.19 mg via INTRAVENOUS
  Administered 2012-03-24: 0.5 mg via INTRAVENOUS
  Administered 2012-03-25: 2.1 mg via INTRAVENOUS
  Administered 2012-03-25: 05:00:00 via INTRAVENOUS
  Administered 2012-03-25: 1.9 mg via INTRAVENOUS
  Filled 2012-03-23 (×3): qty 25

## 2012-03-23 MED ORDER — PROMETHAZINE HCL 25 MG/ML IJ SOLN
12.5000 mg | Freq: Four times a day (QID) | INTRAMUSCULAR | Status: DC | PRN
  Administered 2012-03-23 – 2012-03-25 (×2): 12.5 mg via INTRAVENOUS
  Filled 2012-03-23 (×2): qty 1

## 2012-03-23 MED ORDER — LACTATED RINGERS IV SOLN
INTRAVENOUS | Status: DC
  Administered 2012-03-23 – 2012-03-28 (×14): via INTRAVENOUS
  Administered 2012-03-28: 1000 mL via INTRAVENOUS

## 2012-03-23 NOTE — ED Notes (Signed)
Pt very uncomfortable. Pain in RLQ and R flank. . FHR doppler at 155. Notified V. Emilee Hero, CNM IV and Pain meds ordered.

## 2012-03-23 NOTE — ED Notes (Signed)
Report given to [facility]  RN. Pt to go to [facility] for MRI. Step mother  To accompany pt to [facility].

## 2012-03-23 NOTE — MAU Note (Signed)
Onset of right side abdominal pain has history of UTI last week having back pain, patient is 20 weeks, denies vaginal bleeding

## 2012-03-23 NOTE — MAU Provider Note (Addendum)
History     CSN: [id number]  Arrival date and time: 03/23/12 1331   First Provider Initiated Contact with Patient 03/23/12 1417      Chief Complaint  Patient presents with  . Abdominal Pain  . Back Pain   HPI [name] Bass is 26 y.o. G3P2002 [redacted]w[redacted]d weeks presenting with severe right sided and right back pain that began this am. She seems very uncomfortable, in tears.  She reports the pain is constant.  Rating the pain 10 out of 10.  She has nausea and vomiting and decreased appetite.  Denies diarrhea.  She was seen here 7/28 with dx of UTI and bacterial vaginosis.  Was treated with Macrobid and Flagyl and is taking the medication.   Urine culture on that date was negative. Erin Sons, CNM called and asked that I do the MSE--she is in a delivery.  She has given orders for labs, medication and IV.      Past Medical History  Diagnosis Date  . Abnormal Pap smear   . Polycystic ovarian syndrome 05/08/05  . 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 CIN I (cervical intraepithelial neoplasia I)   . Herpes simplex without mention of complication   . FHx: suicide   . H/O amenorrhea 02/19/04  . Vaginitis and vulvovaginitis 04/29/04  . Bacterial vaginosis 04/29/04  . Dysuria 04/04/05  . Irregular periods/menstrual cycles   . Hx: UTI (urinary tract infection)   . Dyspareunia 05/02/10  . Herpes simplex type 2 infection     "last outbreak 3 weeks ago"    Past Surgical History  Procedure Date  . Cholecystectomy, laparoscopic 2010  . Wisdom tooth extraction 2001  . Tonsillectomy 2006    Family History  Problem Relation Age of Onset  . Heart disease Father     heart attack in oct.  lm  . Cancer Maternal Uncle     bone cancer  . Cancer Maternal Grandmother     ovarian cancer  . Suicidality Mother     History  Substance Use Topics  . Smoking status: Never Smoker   . Smokeless tobacco: Not on file  . Alcohol Use: No     sometimes    Allergies:  Allergies  Allergen Reactions    . Amoxicillin Rash    Prescriptions prior to admission  Medication Sig Dispense Refill  . FOLIC ACID PO Take 1 tablet by mouth daily.      . metroNIDAZOLE (FLAGYL) 500 MG tablet Take 1 tablet (500 mg total) by mouth 2 (two) times daily.  14 tablet  0  . nitrofurantoin, macrocrystal-monohydrate, (MACROBID) 100 MG capsule Take 1 capsule (100 mg total) by mouth 2 (two) times daily.  14 capsule  0  . pantoprazole (PROTONIX) 20 MG tablet Take 1 tablet (20 mg total) by mouth daily.  30 tablet  4    Review of Systems  Constitutional: Positive for diaphoresis. Negative for fever.  Gastrointestinal: Positive for nausea, vomiting and abdominal pain. Negative for diarrhea and constipation.  Genitourinary:       Neg for vaginal bleeding. + FHTs   Physical Exam   Blood pressure 127/97, pulse 97, temperature 98.6 F (37 C), temperature source Oral, resp. rate 20, last menstrual period 10/06/2011, SpO2 98.00%, unknown if currently breastfeeding.  Physical Exam  Constitutional: She is oriented to person, place, and time. She appears well-developed and well-nourished. She appears distressed.  HENT:  Head: Normocephalic.  Neck: Normal range of motion.  Cardiovascular: Normal rate.  Respiratory: Effort normal.  GI: There is tenderness. There is guarding (in RLQ). There is no rebound.  Genitourinary:       + rt flank tendernss  Neurological: She is alert and oriented to person, place, and time.  Skin: Skin is warm. She is diaphoretic.     MAU Course  Procedures  MDM  Order for I&O cath for UA--patient is unable to go to restroom. Phenergan 12.5 IV and Morphine Sulfate 10mg  Im given.  IV infusing.   14:30  Report given to Dr. Su Hilt by Olegario Messier, RN.  She is coming in to see patient.  14:30  States the pain in her back is less after the medication but the abdominal pain is actually more.    Assessment and Plan    KEY,EVE M 03/23/2012, 2:18 PM   Called to see pt.  Pt visibly in pain  holding RLQ.  Pt started at 10am.  She reports no decrease in appetite however except for the N/V today that started with the pain.  She was being treated for BV and possibly UTI but UCx from the other day returned neg.  WBC today is mildle elevated with a shift.  Will give another 1mg  of stadol secondary to persistent pain s/p Morphine 10mg  IM.  +FHTs noted.  Cervix is closed and long and there is no abnl d/c.  Radiologist recommended CT Scan with oral contrast for eval of appy and kidney stone but it would take 2hrs for the oral contrast to be helpful and secondary to the exquisite pain the pt is currently in, an MRI could be helpful for both and done now.  Will order MRI.  GC/CT/Wet Prep/GBS/Ucx/CCUA/CMET/Amylase/Lipase all pending.

## 2012-03-23 NOTE — Progress Notes (Signed)
Report called to Rogelio Seen RN on [employer]. To 317 via bed.

## 2012-03-23 NOTE — ED Notes (Signed)
Dr Su Hilt at bedside pelvic exam done cultures sent.

## 2012-03-23 NOTE — ED Notes (Signed)
IV started  Phenergan given. Im MS given earlier not working well for pain Dr. Su Hilt notified of pt increased pain level coming to eval pt.

## 2012-03-23 NOTE — H&P (Signed)
[name] Bass is a 26 y.o. female, G3P2002 at 16 1/7 weeks presenting for admission after diagnosis of right kidney stone.  Patient presented to MAU with sudden onset of severe pain in RLQ and back, refractive to IM morphine (done when IV was difficult to start).  She received Phenergan IV, and had labs done.  MRI was done due to patient's discomfort, with right hydronephrosis and suspicion of right kidney stone.  She is to be admitted for IV hydration and pain management with Dilaudid PCA (Stadol doesn't cover her pain for very long).  She denies any history of renal stones.  See MAU notes for further information regarding presentation.  History of present pregnancy: Patient entered care at 10 weeks.  EDC of 08/09/12 was established by 1st trimester [place], due to unsure LMP.  Anatomy scan was done at 18 weeks, with normal findings..   She was diagnosed with a UTI from a MAU visit on 03/17/12, with Macrobid and Flagyl Rx'd from that visit.  Urine culture and GC/Chlamydia were negative from that visit.  Patient Active Problem List  Diagnosis  . Lower abdominal pain  . High risk HPV infection  . History of PCOS  . Cervical intraepithelial neoplasia I  . HSV (herpes simplex virus) anogenital infection  . Abnormal Pap smear  . Polycystic ovarian syndrome  . CIN I (cervical intraepithelial neoplasia I)  . Herpes simplex without mention of complication  . FHx: suicide  . Normal pregnancy  . Kidney stone on right side    History OB History    Grav Para Term Preterm Abortions TAB SAB Ect Mult Living   3 2 2       2      Past Medical History  Diagnosis Date  . Abnormal Pap smear   . Polycystic ovarian syndrome 05/08/05  . 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 CIN I (cervical intraepithelial neoplasia I)   . Herpes simplex without mention of complication   . FHx: suicide   . H/O amenorrhea 02/19/04  . Vaginitis and vulvovaginitis 04/29/04  . Bacterial vaginosis 04/29/04  . Dysuria 04/04/05   . Irregular periods/menstrual cycles   . Hx: UTI (urinary tract infection)   . Dyspareunia 05/02/10  . Herpes simplex type 2 infection     "last outbreak 3 weeks ago"   Past Surgical History  Procedure Date  . Cholecystectomy, laparoscopic 2010  . Wisdom tooth extraction 2001  . Tonsillectomy 2006   Family History: family history includes Cancer in her maternal grandmother and maternal uncle; Heart disease in her father; and Suicidality in her mother. Social History:  reports that she has never smoked. She does not have any smokeless tobacco history on file. She reports that she does not drink alcohol or use illicit drugs.  ROS:  Right back and side/RLQ pain, nausea.    Blood pressure 126/65, pulse 95, temperature 98.2 F (36.8 C), temperature source Oral, resp. rate 18, last menstrual period 10/06/2011, SpO2 98.00%. Exam Physical Exam  Chest clear Heart RRR without murmur Abd gravid, moderate tenderness in the RLQ--some rebound. No clear CVAT--back just hurts all over when touched. Pelvic--closed per Dr. Su Hilt exam. Ext WNL  Results for orders placed during the hospital encounter of 03/23/12 (from the past 24 hour(s))  CBC WITH DIFFERENTIAL     Status: Abnormal   Collection Time   03/23/12  2:35 PM      Component Value Range   WBC 15.6 (*) 4.0 - 10.5 K/uL  RBC 3.97  3.87 - 5.11 MIL/uL   Hemoglobin 12.1  12.0 - 15.0 g/dL   HCT 16.1 (*) 09.6 - 04.5 %   MCV 90.4  78.0 - 100.0 fL   MCH 30.5  26.0 - 34.0 pg   MCHC 33.7  30.0 - 36.0 g/dL   RDW 40.9  81.1 - 91.4 %   Platelets 209  150 - 400 K/uL   Neutrophils Relative 82 (*) 43 - 77 %   Neutro Abs 12.8 (*) 1.7 - 7.7 K/uL   Lymphocytes Relative 12  12 - 46 %   Lymphs Abs 1.9  0.7 - 4.0 K/uL   Monocytes Relative 5  3 - 12 %   Monocytes Absolute 0.7  0.1 - 1.0 K/uL   Eosinophils Relative 1  0 - 5 %   Eosinophils Absolute 0.1  0.0 - 0.7 K/uL   Basophils Relative 0  0 - 1 %   Basophils Absolute 0.0  0.0 - 0.1 K/uL    COMPREHENSIVE METABOLIC PANEL     Status: Abnormal   Collection Time   03/23/12  2:35 PM      Component Value Range   Sodium 137  135 - 145 mEq/L   Potassium 3.4 (*) 3.5 - 5.1 mEq/L   Chloride 101  96 - 112 mEq/L   CO2 22  19 - 32 mEq/L   Glucose, Bld 109 (*) 70 - 99 mg/dL   BUN 7  6 - 23 mg/dL   Creatinine, Ser 7.82  0.50 - 1.10 mg/dL   Calcium 9.9  8.4 - 95.6 mg/dL   Total Protein 6.5  6.0 - 8.3 g/dL   Albumin 3.3 (*) 3.5 - 5.2 g/dL   AST 15  0 - 37 U/L   ALT 13  0 - 35 U/L   Alkaline Phosphatase 77  39 - 117 U/L   Total Bilirubin 0.2 (*) 0.3 - 1.2 mg/dL   GFR calc non Af Amer >90  >90 mL/min   GFR calc Af Amer >90  >90 mL/min  AMYLASE     Status: Normal   Collection Time   03/23/12  2:35 PM      Component Value Range   Amylase 51  0 - 105 U/L  LIPASE, BLOOD     Status: Normal   Collection Time   03/23/12  2:35 PM      Component Value Range   Lipase 25  11 - 59 U/L  URINALYSIS, ROUTINE W REFLEX MICROSCOPIC     Status: Abnormal   Collection Time   03/23/12  2:38 PM      Component Value Range   Color, Urine YELLOW  YELLOW   APPearance CLEAR  CLEAR   Specific Gravity, Urine 1.020  1.005 - 1.030   pH 6.5  5.0 - 8.0   Glucose, UA NEGATIVE  NEGATIVE mg/dL   Hgb urine dipstick TRACE (*) NEGATIVE   Bilirubin Urine NEGATIVE  NEGATIVE   Ketones, ur 15 (*) NEGATIVE mg/dL   Protein, ur NEGATIVE  NEGATIVE mg/dL   Urobilinogen, UA 0.2  0.0 - 1.0 mg/dL   Nitrite NEGATIVE  NEGATIVE   Leukocytes, UA NEGATIVE  NEGATIVE  URINE MICROSCOPIC-ADD ON     Status: Normal   Collection Time   03/23/12  2:38 PM      Component Value Range   Squamous Epithelial / LPF RARE  RARE   RBC / HPF 0-2  <3 RBC/hpf   Bacteria, UA RARE  RARE  WET PREP, GENITAL     Status: Abnormal   Collection Time   03/23/12  3:00 PM      Component Value Range   Yeast Wet Prep HPF POC NONE SEEN  NONE SEEN   Trich, Wet Prep NONE SEEN  NONE SEEN   Clue Cells Wet Prep HPF POC NONE SEEN  NONE SEEN   WBC, Wet Prep HPF POC FEW  (*) NONE SEEN   RADIOLOGY REPORT*  Clinical Data: Right lower quadrant pain  MRI ABDOMEN WITHOUT CONTRAST  Technique: Multiplanar multisequence MR imaging of the abdomen was  performed. No intravenous contrast was administered.  Comparison: None  Findings: Marked right-sided hydronephrosis and hydroureter is  identified. There is proximal periureteral fat stranding.  Dilatation of the ureter is noted up to the level of the UVJ.  Increased soft tissues signal in the region of the UPJ may indicate  edema. Although a signal void indicative of renal stone or  ureteral stone is not identified a tiny distal ureteral calculus  cannot be excluded.  There is a tubular structure within the right lower quadrant which  is measures up to 8.1 mm. This is favored to represent a normal  appendix. No inflammatory change within the right lower quadrant  is identified to suggest acute appendicitis.  Gravid uterus is present.  IMPRESSION:  1. Right-sided hydronephrosis and hydroureter to the level of the  UVJ. Finding is suspicious for distal ureteral calculus.  2. No specific features identified to suggest acute appendicitis.  3. Gravid uterus.  Original Report Authenticated By: Rosealee Albee, M.D.   Prenatal labs: ABO, Rh: A/POS/-- (05/29 1714) Antibody: NEG (05/29 1714) Rubella: 115.2 (05/29 1714) RPR: NON REAC (05/29 1714)  HBsAg: NEGATIVE (05/29 1714)  HIV: NON REACTIVE (05/29 1714)  GBS:   Pending from today Urine culture negative from 03/17/12 Cultures negative from 03/17/12.  Assessment/Plan: IUP at 20 1/7 weeks Right hydronephrosis/hydroureter, suspicious for distal calculus  Plan: Admit to [facility] Unit per consult with Dr. Su Hilt IV hydration Pain management with PCA Dilaudid Complete today's doses of Macrobid and Flagyl to complete Rx'd course from 7/28 MAU visit. Strain all urine.  Nigel Bridgeman 03/23/2012, 6:24 PM

## 2012-03-23 NOTE — ED Notes (Signed)
Unable to get IV started second RN trying without success. House sup notified.

## 2012-03-24 LAB — CBC WITH DIFFERENTIAL/PLATELET
Basophils Relative: 0 % (ref 0–1)
Eosinophils Absolute: 0.1 10*3/uL (ref 0.0–0.7)
Eosinophils Relative: 1 % (ref 0–5)
HCT: 31 % — ABNORMAL LOW (ref 36.0–46.0)
Hemoglobin: 10.4 g/dL — ABNORMAL LOW (ref 12.0–15.0)
MCH: 30.8 pg (ref 26.0–34.0)
MCHC: 33.5 g/dL (ref 30.0–36.0)
Monocytes Absolute: 0.7 10*3/uL (ref 0.1–1.0)
Monocytes Relative: 5 % (ref 3–12)
RDW: 14.2 % (ref 11.5–15.5)

## 2012-03-24 LAB — URINE CULTURE: Culture: NO GROWTH

## 2012-03-24 NOTE — Plan of Care (Signed)
Problem: Consults Goal: Antepartum Patient Education Outcome: Progressing Pain control is main issue at this time  Problem: Phase I Progression Outcomes Goal: Bowel movement every 2 days Outcome: Completed/Met Date Met:  03/24/12 BM on 03/24/11 Goal: Maintains reassuring Fetal Heart Rate Outcome: Completed/Met Date Met:  03/24/12 Initial FHR WNL Goal: Pain controlled with appropriate interventions Outcome: Completed/Met Date Met:  03/24/12 Good pain control with Dilaudid PCA Goal: OOB as tolerated unless otherwise ordered Outcome: Completed/Met Date Met:  03/24/12 Ambulates with assist to Bathroom without difficulty Goal: Initial discharge plan identified Outcome: Completed/Met Date Met:  03/24/12 Pain Control Goal: Other Phase I Outcomes/Goals Outcome: Completed/Met Date Met:  03/24/12 Pain relief  Problem: Phase II Progression Outcomes Goal: Electronic fetal monitoring(Doppler,Continuous,Intermittent) EFM (Doppler, Continuous, Intermittent) Outcome: Completed/Met Date Met:  03/24/12 Doppler FHR WNL Goal: Progress activity as tolerated unless otherwise ordered Outcome: Completed/Met Date Met:  03/24/12 Tolerates ambulating Goal: Output > 30 ml/hr or voiding qs Outcome: Completed/Met Date Met:  03/24/12 No stones strained Goal: Medications as ordered (see description) Medications as ordered (Magnesium Sulfate, Steroids, PO Tocolysis, Antibiotics, Terbutaline Pump, Other) Outcome: Completed/Met Date Met:  03/24/12 Abx given

## 2012-03-24 NOTE — Progress Notes (Signed)
Hospital day # 1 pregnancy at [redacted]w[redacted]d--Presumptive right kidney stone  S:  Still has right sided pain, less constant than before, but       still present.  Dilaudid PCA helping, but still 6/10.       Using Kpad to back for comfort       + nausea, but no vomiting.       No appetite.  Able to take fluids, but makes nauseated       No flatus.       Straining urine, but no sediment or stone noted.       O: BP 129/84  Pulse 88  Temp 89.3 F (31.8 C) (Oral)  Resp 18  Wt 216 lb 5 oz (98.119 kg)  SpO2 98%  LMP 10/06/2011      Fetal tracings:  FHR 166 per doppler      Contractions:   None per patient      Uterus gravid       Abdomen: No true rebound, just generally tender in RLQ.      Extremities: no significant edema and no signs of DVT      SCDs on.          Labs:  GC, chlamydia, GBS pending from admission       Meds: Dilaudid PCA                 Received Zofran at 12 noon.  A: [redacted]w[redacted]d with right kidney stone     Persistent pain  P: Continue current plan of care--pain med, antiemetics.      MDs will follow  [name] Bass CNM, [place] 03/24/2012 12:45 PM

## 2012-03-25 ENCOUNTER — Encounter (HOSPITAL_COMMUNITY): Payer: Self-pay | Admitting: *Deleted

## 2012-03-25 DIAGNOSIS — N200 Calculus of kidney: Secondary | ICD-10-CM

## 2012-03-25 MED ORDER — IBUPROFEN 600 MG PO TABS
600.0000 mg | ORAL_TABLET | Freq: Four times a day (QID) | ORAL | Status: DC
  Administered 2012-03-25 – 2012-03-26 (×3): 600 mg via ORAL
  Filled 2012-03-25 (×3): qty 1

## 2012-03-25 MED ORDER — CYCLOBENZAPRINE HCL 5 MG PO TABS
5.0000 mg | ORAL_TABLET | Freq: Four times a day (QID) | ORAL | Status: DC | PRN
  Filled 2012-03-25: qty 1

## 2012-03-25 MED ORDER — CYCLOBENZAPRINE HCL 5 MG PO TABS
5.0000 mg | ORAL_TABLET | Freq: Three times a day (TID) | ORAL | Status: DC
  Administered 2012-03-25 – 2012-03-27 (×8): 5 mg via ORAL
  Filled 2012-03-25 (×13): qty 1

## 2012-03-25 MED ORDER — DOCUSATE SODIUM 100 MG PO CAPS
100.0000 mg | ORAL_CAPSULE | Freq: Two times a day (BID) | ORAL | Status: DC
  Administered 2012-03-25 – 2012-03-29 (×7): 100 mg via ORAL
  Filled 2012-03-25 (×7): qty 1

## 2012-03-25 MED ORDER — HYDROMORPHONE 0.3 MG/ML IV SOLN
INTRAVENOUS | Status: DC
  Administered 2012-03-25: 0.3 mg via INTRAVENOUS
  Administered 2012-03-25: 3.9 mg via INTRAVENOUS
  Administered 2012-03-26: 2 mg via INTRAVENOUS
  Administered 2012-03-26: 0.3 mg via INTRAVENOUS
  Administered 2012-03-26: 1.92 mg via INTRAVENOUS
  Administered 2012-03-26: 1.2 mg via INTRAVENOUS
  Administered 2012-03-26: 0.3 mg via INTRAVENOUS
  Administered 2012-03-26: 03:00:00 via INTRAVENOUS
  Administered 2012-03-27 (×4): 0.3 mg via INTRAVENOUS
  Filled 2012-03-25: qty 25

## 2012-03-25 MED ORDER — IBUPROFEN 600 MG PO TABS: 600.0000 mg | ORAL_TABLET | Freq: Four times a day (QID) | ORAL | Status: DC | PRN

## 2012-03-25 NOTE — Progress Notes (Signed)
Pt was in extreme pain which she said was from a kidney stone.  During my visit we focused on trying to ease the pain slightly through focusing on breathing and focusing on feeling the support of the bed so she did not have to tense against the pain.  She was able to settle herself down very well and the pain did ease some.   She has two young children (ages 2 and 3) and we talked about how difficult it is for her to take care of herself when she has two children depending on her.  I offered compassionate listening and pastoral presence.  Please page as needed, [phone].  Chaplain Orpha Bur Deloris Moger 1:04 PM   03/25/12 1200  Clinical Encounter Type  Visited With Patient  Visit Type Initial;Spiritual support  Referral From Nurse  Spiritual Encounters  Spiritual Needs Emotional  Stress Factors  Patient Stress Factors (severe pain )

## 2012-03-25 NOTE — Progress Notes (Signed)
Ur chart review completed.  

## 2012-03-25 NOTE — Progress Notes (Addendum)
Patient ID: [name] Bass, female   DOB: [birth year], 26 y.o.   MRN: [id number]  Hospital day # 2 pregnancy at [redacted]w[redacted]d  S: states pain has worsened overnight and pca not helping very much, states it's worse when moving, but "ok' when just laying there, pain mostly in R flank, around to RLQ,  Has been voiding well, c/o "pressure" w void, no dysuria, denies any VB, LOF, or D/C Has not had anything strained in urine   Feels nauseated but no vomiting, is tol PO fluids, tho not taking much, has not tried to eat much  O: BP 120/83  Pulse 82  Temp 98.1 F (36.7 C) (Oral)  Resp 13  Wt 218 lb (98.884 kg)  SpO2 97%  LMP 10/06/2011 FHR 160 per doppler A&O x3, NAD, drowsy Heart RRR Lungs CTAB abd soft, NT, BS x4, tho decreased in lower quadrants Ext: no edema, neg homans,   WBC decrease 13.9, still has slight shift         A: [redacted]w[redacted]d with presumptive kidney stone/hydronephrosis  Vitals are stable Pain worsened overnight       P: D/W Dr Estanislado Pandy Will try to D/C PCA and switch to PO, enc pt to increase PO intake  Cont current POC for now  LILLARD,SHELLEY M  CNM  03/25/2012 12:52 PM  Addendum: after d/w Dr Estanislado Pandy Will continue PCA Add motrin 600mg  q 6 hr for pain    Seen. Pt still C/O Right flank pain 7/10 despite Dilaudid PCA. Nausea on/off. Little appetite. Requesting urology consult called to Dr Myer Haff at [phone]. Will see pt in am Add Ibuprofen and Flexeril to maximize pain management.

## 2012-03-26 ENCOUNTER — Inpatient Hospital Stay (HOSPITAL_COMMUNITY): Payer: Medicaid Other

## 2012-03-26 LAB — CULTURE, BETA STREP (GROUP B ONLY)

## 2012-03-26 MED ORDER — TAMSULOSIN HCL 0.4 MG PO CAPS
0.4000 mg | ORAL_CAPSULE | Freq: Every day | ORAL | Status: DC
  Administered 2012-03-26 – 2012-03-27 (×2): 0.4 mg via ORAL
  Filled 2012-03-26 (×4): qty 1

## 2012-03-26 MED ORDER — BISACODYL 10 MG RE SUPP
10.0000 mg | Freq: Every day | RECTAL | Status: DC | PRN
  Administered 2012-03-27: 10 mg via RECTAL
  Filled 2012-03-26: qty 1

## 2012-03-26 MED ORDER — ACETAMINOPHEN 10 MG/ML IV SOLN
1000.0000 mg | Freq: Four times a day (QID) | INTRAVENOUS | Status: AC
  Administered 2012-03-26 – 2012-03-27 (×3): 1000 mg via INTRAVENOUS
  Filled 2012-03-26 (×4): qty 100

## 2012-03-26 NOTE — Consult Note (Signed)
Urology Consult   Physician requesting consult: Rivard  Reason for consult: Right sided hydronephrosis/ureter  History of Present Illness: [name] Bass is a 26 y.o. pregnant female with PMH significant for PCOS and HPV who presented on 03/23/12 with complaints of RLQ and flank pain.  She was admitted for pain control and eval for possible right kidney stone.  She has continued to have severe right sided pain despite multiple medications including PCA dilaudid, motrim, tylenol, and flexeril.  She has had intermittent nausea as well with poor PO intake.  MRI revealed right sided hydronephrosis and ureter to the level of the UVJ with no clear evidence of stone. UA on admission revealed trace blood with no micro UA done.  Urine culture was negative and Cr is normal.  She denies hx of nephrolithiasis.  She has recent hx of UTI treated with macrobid and flagyl.  She is currently [redacted] weeks pregnant. She has been straining her urine with no stone seen.  She denies a history of voiding or storage urinary symptoms, hematuria, and GU malignancy/trauma/surgery.  Past Medical History  Diagnosis Date  . Abnormal Pap smear   . Polycystic ovarian syndrome 05/08/05  . 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 CIN I (cervical intraepithelial neoplasia I)   . Herpes simplex without mention of complication   . FHx: suicide   . H/O amenorrhea 02/19/04  . Vaginitis and vulvovaginitis 04/29/04  . Bacterial vaginosis 04/29/04  . Dysuria 04/04/05  . Irregular periods/menstrual cycles   . Hx: UTI (urinary tract infection)   . Dyspareunia 05/02/10  . Herpes simplex type 2 infection     "last outbreak 3 weeks ago"    Past Surgical History  Procedure Date  . Cholecystectomy, laparoscopic 2010  . Wisdom tooth extraction 2001  . Tonsillectomy 2006     Current Hospital Medications: Scheduled Meds:   . cyclobenzaprine  5 mg Oral TID  . docusate sodium  100 mg Oral BID  . HYDROmorphone PCA 0.3 mg/mL    Intravenous Q4H  . ibuprofen  600 mg Oral QID  . prenatal multivitamin  1 tablet Oral Daily  . DISCONTD: HYDROmorphone PCA 0.3 mg/mL   Intravenous Q4H   Continuous Infusions:   . lactated ringers 125 mL/hr at 03/26/12 0540   PRN Meds:.acetaminophen, diphenhydrAMINE, diphenhydrAMINE, naloxone, ondansetron (ZOFRAN) IV, ondansetron (ZOFRAN) IV, ondansetron, oxyCODONE-acetaminophen, promethazine, sodium chloride, zolpidem, DISCONTD: cyclobenzaprine, DISCONTD: ibuprofen  Allergies:  Allergies  Allergen Reactions  . Amoxicillin Rash    Family History  Problem Relation Age of Onset  . Heart disease Father     heart attack in oct.  lm  . Cancer Maternal Uncle     bone cancer  . Cancer Maternal Grandmother     ovarian cancer  . Suicidality Mother     Social History:  reports that she has never smoked. She does not have any smokeless tobacco history on file. She reports that she does not drink alcohol or use illicit drugs.  ROS: A complete review of systems was performed.  All systems are negative except for pertinent findings as noted.  Physical Exam:  Vital signs in last 24 hours: Temp:  [97.4 F (36.3 C)-98.5 F (36.9 C)] 97.4 F (36.3 C) (08/06 0945) Pulse Rate:  [84-111] 97  (08/06 0945) Resp:  [12-18] 14  (08/06 1007) BP: (109-130)/(70-79) 121/79 mmHg (08/06 0945) SpO2:  [91 %-100 %] 99 % (08/06 1007) General:  Alert and oriented, No acute distress HEENT: Normocephalic, atraumatic Neck: No JVD  or lymphadenopathy Cardiovascular: Regular rate and rhythm Lungs: Clear bilaterally Abdomen: Soft, gravid; +BS Back: No CVA tenderness Extremities: No edema Neurologic: Grossly intact  Laboratory Data:   Basename 03/24/12 1515 03/23/12 1435  WBC 13.9* 15.6*  HGB 10.4* 12.1  HCT 31.0* 35.9*  PLT 179 209     Basename 03/23/12 1435  NA 137  K 3.4*  CL 101  GLUCOSE 109*  BUN 7  CALCIUM 9.9  CREATININE 0.50     No results found for this or any previous visit  (from the past 24 hour(s)). Recent Results (from the past 240 hour(s))  URINE CULTURE     Status: Normal   Collection Time   03/17/12 10:55 AM      Component Value Range Status Comment   Specimen Description URINE, CLEAN CATCH   Final    Special Requests none   Final    Culture  Setup Time 03/17/2012 18:08   Final    Colony Count 2,000 COLONIES/ML   Final    Culture     Final    Value: INSIGNIFICANT GROWTH     Note: NO GROUP B STREP (S.AGALACTIAE) ISOLATED   Report Status 03/18/2012 FINAL   Final   WET PREP, GENITAL     Status: Abnormal   Collection Time   03/17/12 11:30 AM      Component Value Range Status Comment   Yeast Wet Prep HPF POC NONE SEEN  NONE SEEN Final    Trich, Wet Prep NONE SEEN  NONE SEEN Final    Clue Cells Wet Prep HPF POC FEW (*) NONE SEEN Final    WBC, Wet Prep HPF POC FEW (*) NONE SEEN Final MODERATE BACTERIA SEEN  URINE CULTURE     Status: Normal   Collection Time   03/23/12  2:38 PM      Component Value Range Status Comment   Specimen Description URINE, CLEAN CATCH   Final    Special Requests NONE   Final    Culture  Setup Time 03/23/2012 17:09   Final    Colony Count NO GROWTH   Final    Culture NO GROWTH   Final    Report Status 03/24/2012 FINAL   Final   CULTURE, BETA STREP (GROUP B ONLY)     Status: Normal   Collection Time   03/23/12  3:00 PM      Component Value Range Status Comment   Specimen Description VAGINAL/RECTAL   Final    Special Requests NONE   Final    Culture NO GROUP B STREP (S.AGALACTIAE) ISOLATED   Final    Report Status 03/26/2012 FINAL   Final   WET PREP, GENITAL     Status: Abnormal   Collection Time   03/23/12  3:00 PM      Component Value Range Status Comment   Yeast Wet Prep HPF POC NONE SEEN  NONE SEEN Final    Trich, Wet Prep NONE SEEN  NONE SEEN Final    Clue Cells Wet Prep HPF POC NONE SEEN  NONE SEEN Final    WBC, Wet Prep HPF POC FEW (*) NONE SEEN Final BACTERIA- TOO NUMEROUS TO COUNT    Renal Function:  Basename  03/23/12 1435  CREATININE 0.50   Estimated Creatinine Clearance: 134.6 ml/min (by C-G formula based on Cr of 0.5).  Radiologic Imaging:  Study Result     *RADIOLOGY REPORT*  Clinical Data: Right lower quadrant pain  MRI ABDOMEN WITHOUT CONTRAST  Technique: Multiplanar  multisequence MR imaging of the abdomen was  performed. No intravenous contrast was administered.  Comparison: None  Findings: Marked right-sided hydronephrosis and hydroureter is  identified. There is proximal periureteral fat stranding.  Dilatation of the ureter is noted up to the level of the UVJ.  Increased soft tissues signal in the region of the UPJ may indicate  edema. Although a signal void indicative of renal stone or  ureteral stone is not identified a tiny distal ureteral calculus  cannot be excluded.  There is a tubular structure within the right lower quadrant which  is measures up to 8.1 mm. This is favored to represent a normal  appendix. No inflammatory change within the right lower quadrant  is identified to suggest acute appendicitis.  Gravid uterus is present.  IMPRESSION:  1. Right-sided hydronephrosis and hydroureter to the level of the  UVJ. Finding is suspicious for distal ureteral calculus.  2. No specific features identified to suggest acute appendicitis.  3. Gravid uterus.  Original Report Authenticated By: Rosealee Albee, M.D.   Renal ultrasound today revealed a 7 mm right distal ureteral stone. There is passage of urine around the stone, however.   Impression/Assessment:  Right sided hydronephrosis/ureter with no stone seen on MRI nor collected in strained urine.  Her pain has continued to be significant despite PCA dilaudid and multiple other medications  Plan:  Will get RUS to eval if UVJ stone is possible/present.  Try to avoid intervention in pregnant pt unless sx cannot be controlled with PO pain meds.  D/C Motrin and start IV tylenol.    I spoke with the patient and her  husband at length tonight. We discussed the size as well as the location of the right ureteral stone. At 7 mm, it most likely will take some time to pass. The patient has had some symptoms for a few days prior to her admission on Saturday. She is getting a little bit better. We outlined treatment options-these include eventually letting her go home with suppressive antibiotic daily as well as oral narcotic versus eventual anesthetic procedure for ureteroscopy and right stone extraction using a laser to fragment the stone and possible stent placement.  If she is feeling better, I would obviously recommend giving her time to pass the stone. The patient and her husband will think about what they want to do. If ureteroscopy is necessary, the patient will need clearance from a neonatologist as well as clearance from the obstetrical team. She will need preoperative fetal heart tones in the holding area at [facility] prior to the procedure, as well as afterward. I will followup tomorrow evening. Silas Flood 03/26/2012, 10:34 AM  Moody Bruins. MD

## 2012-03-26 NOTE — Plan of Care (Signed)
Problem: Phase I Progression Outcomes Goal: LOS < 4 days Outcome: Not Met (add Reason) Condition not improving in less than 4 days

## 2012-03-26 NOTE — Progress Notes (Addendum)
Patient ID: [name] Bass, female   DOB: 02/08/1986, 26 y.o.   MRN: [id number] Pt without complaints.  No leakage of fluid or VB.  Good FM.  Pt c/o pressure and urinary frequency  BP 121/79  Pulse 97  Temp 97.4 F (36.3 C) (Oral)  Resp 14  Ht 5\' 10"  (1.778 m)  Wt 218 lb (98.884 kg)  BMI 31.28 kg/m2  SpO2 99%  LMP 10/06/2011  FHTS 157  Toco none  Pt in NAD CV RRR Lungs CTAB abd  Gravid soft and NT GU no vb EXt no calf tenderness Results for orders placed during the hospital encounter of 03/23/12 (from the past 72 hour(s))  CBC WITH DIFFERENTIAL     Status: Abnormal   Collection Time   03/23/12  2:35 PM      Component Value Range Comment   WBC 15.6 (*) 4.0 - 10.5 K/uL    RBC 3.97  3.87 - 5.11 MIL/uL    Hemoglobin 12.1  12.0 - 15.0 g/dL    HCT 40.9 (*) 81.1 - 46.0 %    MCV 90.4  78.0 - 100.0 fL    MCH 30.5  26.0 - 34.0 pg    MCHC 33.7  30.0 - 36.0 g/dL    RDW 91.4  78.2 - 95.6 %    Platelets 209  150 - 400 K/uL    Neutrophils Relative 82 (*) 43 - 77 %    Neutro Abs 12.8 (*) 1.7 - 7.7 K/uL    Lymphocytes Relative 12  12 - 46 %    Lymphs Abs 1.9  0.7 - 4.0 K/uL    Monocytes Relative 5  3 - 12 %    Monocytes Absolute 0.7  0.1 - 1.0 K/uL    Eosinophils Relative 1  0 - 5 %    Eosinophils Absolute 0.1  0.0 - 0.7 K/uL    Basophils Relative 0  0 - 1 %    Basophils Absolute 0.0  0.0 - 0.1 K/uL   COMPREHENSIVE METABOLIC PANEL     Status: Abnormal   Collection Time   03/23/12  2:35 PM      Component Value Range Comment   Sodium 137  135 - 145 mEq/L    Potassium 3.4 (*) 3.5 - 5.1 mEq/L    Chloride 101  96 - 112 mEq/L    CO2 22  19 - 32 mEq/L    Glucose, Bld 109 (*) 70 - 99 mg/dL    BUN 7  6 - 23 mg/dL    Creatinine, Ser 2.13  0.50 - 1.10 mg/dL    Calcium 9.9  8.4 - 08.6 mg/dL    Total Protein 6.5  6.0 - 8.3 g/dL    Albumin 3.3 (*) 3.5 - 5.2 g/dL    AST 15  0 - 37 U/L    ALT 13  0 - 35 U/L    Alkaline Phosphatase 77  39 - 117 U/L    Total Bilirubin 0.2 (*) 0.3 -  1.2 mg/dL    GFR calc non Af Amer >90  >90 mL/min    GFR calc Af Amer >90  >90 mL/min   AMYLASE     Status: Normal   Collection Time   03/23/12  2:35 PM      Component Value Range Comment   Amylase 51  0 - 105 U/L   LIPASE, BLOOD     Status: Normal   Collection Time   03/23/12  2:35 PM  Component Value Range Comment   Lipase 25  11 - 59 U/L   URINALYSIS, ROUTINE W REFLEX MICROSCOPIC     Status: Abnormal   Collection Time   03/23/12  2:38 PM      Component Value Range Comment   Color, Urine YELLOW  YELLOW    APPearance CLEAR  CLEAR    Specific Gravity, Urine 1.020  1.005 - 1.030    pH 6.5  5.0 - 8.0    Glucose, UA NEGATIVE  NEGATIVE mg/dL    Hgb urine dipstick TRACE (*) NEGATIVE    Bilirubin Urine NEGATIVE  NEGATIVE    Ketones, ur 15 (*) NEGATIVE mg/dL    Protein, ur NEGATIVE  NEGATIVE mg/dL    Urobilinogen, UA 0.2  0.0 - 1.0 mg/dL    Nitrite NEGATIVE  NEGATIVE    Leukocytes, UA NEGATIVE  NEGATIVE   URINE CULTURE     Status: Normal   Collection Time   03/23/12  2:38 PM      Component Value Range Comment   Specimen Description URINE, CLEAN CATCH      Special Requests NONE      Culture  Setup Time 03/23/2012 17:09      Colony Count NO GROWTH      Culture NO GROWTH      Report Status 03/24/2012 FINAL     URINE MICROSCOPIC-ADD ON     Status: Normal   Collection Time   03/23/12  2:38 PM      Component Value Range Comment   Squamous Epithelial / LPF RARE  RARE    RBC / HPF 0-2  <3 RBC/hpf    Bacteria, UA RARE  RARE   CULTURE, BETA STREP (GROUP B ONLY)     Status: Normal   Collection Time   03/23/12  3:00 PM      Component Value Range Comment   Specimen Description VAGINAL/RECTAL      Special Requests NONE      Culture NO GROUP B STREP (S.AGALACTIAE) ISOLATED      Report Status 03/26/2012 FINAL     WET PREP, GENITAL     Status: Abnormal   Collection Time   03/23/12  3:00 PM      Component Value Range Comment   Yeast Wet Prep HPF POC NONE SEEN  NONE SEEN    Trich, Wet Prep  NONE SEEN  NONE SEEN    Clue Cells Wet Prep HPF POC NONE SEEN  NONE SEEN    WBC, Wet Prep HPF POC FEW (*) NONE SEEN BACTERIA- TOO NUMEROUS TO COUNT  GC/CHLAMYDIA PROBE AMP, GENITAL     Status: Normal   Collection Time   03/23/12  3:00 PM      Component Value Range Comment   GC Probe Amp, Genital NEGATIVE  NEGATIVE    Chlamydia, DNA Probe NEGATIVE  NEGATIVE   CBC WITH DIFFERENTIAL     Status: Abnormal   Collection Time   03/24/12  3:15 PM      Component Value Range Comment   WBC 13.9 (*) 4.0 - 10.5 K/uL    RBC 3.38 (*) 3.87 - 5.11 MIL/uL    Hemoglobin 10.4 (*) 12.0 - 15.0 g/dL    HCT 16.1 (*) 09.6 - 46.0 %    MCV 91.7  78.0 - 100.0 fL    MCH 30.8  26.0 - 34.0 pg    MCHC 33.5  30.0 - 36.0 g/dL    RDW 04.5  40.9 - 81.1 %  Platelets 179  150 - 400 K/uL    Neutrophils Relative 80 (*) 43 - 77 %    Neutro Abs 11.1 (*) 1.7 - 7.7 K/uL    Lymphocytes Relative 14  12 - 46 %    Lymphs Abs 1.9  0.7 - 4.0 K/uL    Monocytes Relative 5  3 - 12 %    Monocytes Absolute 0.7  0.1 - 1.0 K/uL    Eosinophils Relative 1  0 - 5 %    Eosinophils Absolute 0.1  0.0 - 0.7 K/uL    Basophils Relative 0  0 - 1 %    Basophils Absolute 0.0  0.0 - 0.1 K/uL     Assessment and Plan [redacted]w[redacted]d  Kidney stone with severe pain Will start flomax it is a category B.  To help relax the ureter to pass the stone per urology. Check urine culture [place] c/w kidney stone.  Will await urology input.   Pt agrees with the plan

## 2012-03-27 ENCOUNTER — Other Ambulatory Visit: Payer: Self-pay | Admitting: Urology

## 2012-03-27 MED ORDER — GLYCERIN (LAXATIVE) 2.1 G RE SUPP: 1.0000 | Freq: Every day | RECTAL | Status: DC | PRN

## 2012-03-27 MED ORDER — CALCIUM CARBONATE ANTACID 500 MG PO CHEW
1.0000 | CHEWABLE_TABLET | Freq: Three times a day (TID) | ORAL | Status: DC | PRN
  Administered 2012-03-27: 200 mg via ORAL
  Filled 2012-03-27: qty 1

## 2012-03-27 NOTE — Progress Notes (Addendum)
26 y.o. year old female,at [redacted]w[redacted]d gestation.  SUBJECTIVE:  Feels better but still has some pain.  Complains of constipation.  OBJECTIVE:  BP 107/70  Pulse 84  Temp 98.2 F (36.8 C) (Oral)  Resp 18  Ht 5\' 10"  (1.778 m)  Wt 218 lb (98.884 kg)  BMI 31.28 kg/m2  SpO2 99%  LMP 10/06/2011  Fetal Heart Tones:  156  Contractions:          none  Chest: Clear Heart: Regular rate and rhythm Abdomen: Soft and nontender Extremities: No cords or masses  Urine culture: Negative  ASSESSMENT:  [redacted]w[redacted]d Weeks Pregnancy  Painful kidney stone  Constipation  PLAN:  Continue care as outlined by the urologist.  If they feel that cystoscopy is required, then we do approve.  A spinal anesthetic is preferred.  A general anesthetic is acceptable.  We do not recommend fluoroscopy because of radiation to the developing fetus.  It is acceptable for the patient to have her procedure at [facility] if the urologist feel that is the most appropriate location.  Glycerin suppository for constipation.  Leonard Schwartz, M.D.

## 2012-03-27 NOTE — Progress Notes (Signed)
Subjective: [name] Bass is still having some pain, but less so than yesterday. No stone has been recovered yet.  Objective: Vital signs in last 24 hours: Temp:  [97.8 F (36.6 C)-98.5 F (36.9 C)] 98.1 F (36.7 C) (08/07 1400) Pulse Rate:  [84-93] 91  (08/07 1400) Resp:  [15-20] 20  (08/07 1425) BP: (107-131)/(69-80) 131/76 mmHg (08/07 1400) SpO2:  [97 %-100 %] 100 % (08/07 1425)  Intake/Output from previous day:   Intake/Output this shift:    Physical Exam:  Constitutional: Vital signs reviewed. She seems comfortable    Lab Results: No results found for this basename: HGB:3,HCT:3 in the last 72 hours BMET No results found for this basename: NA:2,K:2,CL:2,CO2:2,GLUCOSE:2,BUN:2,CREATININE:2,CALCIUM:2 in the last 72 hours No results found for this basename: LABPT:3,INR:3 in the last 72 hours No results found for this basename: LABURIN:1 in the last 72 hours Results for orders placed during the hospital encounter of 03/23/12  URINE CULTURE     Status: Normal   Collection Time   03/23/12  2:38 PM      Component Value Range Status Comment   Specimen Description URINE, CLEAN CATCH   Final    Special Requests NONE   Final    Culture  Setup Time 03/23/2012 17:09   Final    Colony Count NO GROWTH   Final    Culture NO GROWTH   Final    Report Status 03/24/2012 FINAL   Final   CULTURE, BETA STREP (GROUP B ONLY)     Status: Normal   Collection Time   03/23/12  3:00 PM      Component Value Range Status Comment   Specimen Description VAGINAL/RECTAL   Final    Special Requests NONE   Final    Culture NO GROUP B STREP (S.AGALACTIAE) ISOLATED   Final    Report Status 03/26/2012 FINAL   Final   WET PREP, GENITAL     Status: Abnormal   Collection Time   03/23/12  3:00 PM      Component Value Range Status Comment   Yeast Wet Prep HPF POC NONE SEEN  NONE SEEN Final    Trich, Wet Prep NONE SEEN  NONE SEEN Final    Clue Cells Wet Prep HPF POC NONE SEEN  NONE SEEN Final    WBC, Wet Prep HPF  POC FEW (*) NONE SEEN Final BACTERIA- TOO NUMEROUS TO COUNT    Studies/Results: US Renal  03/26/2012  *RADIOLOGY REPORT*  Clinical Data: Right hydronephrosis and 20-week pregnant patient  RENAL/URINARY TRACT ULTRASOUND COMPLETE  Comparison:  MRI dated March 23, 2012  Findings:  Right Kidney:  Moderate right-sided hydronephrosis persists with a similar appearance compared to the recent MRI scan.  The visualized portion of the ureter is also dilated.  A 7 mm echogenic focus is present at the right UVJ.  The kidney remains normal in size, measuring 14.3 cm,  and the parenchyma is preserved.  Left Kidney:  Normal in size and echotexture, measuring 14.5 cm. No evidence of mass or hydronephrosis.  Bladder:  Normal for degree of distention.  Bilateral ureteral jets are demonstrated.  IMPRESSION: There is a partially obstructing 7 mm calculus at the right UVJ with stable, moderate right hydronephrosis.  A right ureteral jet is demonstrated during the exam, however.  Original Report Authenticated By: Brandon Melnick, M.D.    Assessment/Plan:   Right distal ureteral stone and [redacted] week pregnant female. Thus far, she has not passed it, and this is the fifth  hospital day for pain management. Because of persistent stone in pain, the patient desires to proceed with ureteroscopy and stone extraction. This procedure was discussed in detail with the patient and family members. Risks and complications were also discussed including but not limited to preterm labor, anesthetic complications, infection, ureteral injury, blood in the urine, among others. She understands these, and desires to proceed.    According to the obstetrical patients in procedural areas guidelines from 2012, the patient does not need fetal monitoring. However, I would feel more comfortable with pre-and postprocedural fetal heart tones in the holding area and recovery room, respectively. Additionally, neonatology consult was not required. We have the  okay from the patient's obstetrical team for this.    I have ordered a renal/ureteral ultrasound to be done at approximately 10:00 tomorrow to verify that the stone is still in the distal ureter. The surgery is planned for approximately 1:30 tomorrow.     Chelsea Aus 03/27/2012, 5:54 PM

## 2012-03-28 ENCOUNTER — Inpatient Hospital Stay (HOSPITAL_COMMUNITY): Payer: Medicaid Other

## 2012-03-28 ENCOUNTER — Ambulatory Visit (HOSPITAL_COMMUNITY): Admission: RE | Admit: 2012-03-28 | Payer: Medicaid Other | Source: Ambulatory Visit | Admitting: Urology

## 2012-03-28 ENCOUNTER — Encounter (HOSPITAL_COMMUNITY): Payer: Self-pay | Admitting: Anesthesiology

## 2012-03-28 ENCOUNTER — Encounter (HOSPITAL_COMMUNITY)
Admission: AD | Disposition: A | Discharge status: Home / Self Care | Payer: Self-pay | Source: Ambulatory Visit | Attending: Obstetrics and Gynecology

## 2012-03-28 ENCOUNTER — Inpatient Hospital Stay (HOSPITAL_COMMUNITY): Payer: Medicaid Other | Admitting: Anesthesiology

## 2012-03-28 HISTORY — PX: CYSTOSCOPY WITH URETEROSCOPY: SHX5123

## 2012-03-28 HISTORY — PX: STONE EXTRACTION WITH BASKET: SHX5318

## 2012-03-28 LAB — SURGICAL PCR SCREEN: MRSA, PCR: NEGATIVE

## 2012-03-28 SURGERY — CYSTOSCOPY WITH URETEROSCOPY
Anesthesia: General | Laterality: Right | Wound class: Clean Contaminated

## 2012-03-28 MED ORDER — CEFAZOLIN SODIUM 1-5 GM-% IV SOLN
INTRAVENOUS | Status: DC | PRN
  Administered 2012-03-28: 2 g via INTRAVENOUS

## 2012-03-28 MED ORDER — SUCCINYLCHOLINE CHLORIDE 20 MG/ML IJ SOLN
INTRAMUSCULAR | Status: DC | PRN
  Administered 2012-03-28: 120 mg via INTRAVENOUS

## 2012-03-28 MED ORDER — PROMETHAZINE HCL 25 MG/ML IJ SOLN: 6.2500 mg | INTRAMUSCULAR | Status: DC | PRN

## 2012-03-28 MED ORDER — FENTANYL CITRATE 0.05 MG/ML IJ SOLN
INTRAMUSCULAR | Status: DC | PRN
  Administered 2012-03-28: 100 ug via INTRAVENOUS
  Administered 2012-03-28: 50 ug via INTRAVENOUS

## 2012-03-28 MED ORDER — PROPOFOL 10 MG/ML IV EMUL
INTRAVENOUS | Status: DC | PRN
  Administered 2012-03-28: 150 mg via INTRAVENOUS

## 2012-03-28 MED ORDER — LACTATED RINGERS IV SOLN
INTRAVENOUS | Status: DC
  Administered 2012-03-28: 16:00:00 via INTRAVENOUS

## 2012-03-28 MED ORDER — LIDOCAINE HCL (CARDIAC) 20 MG/ML IV SOLN
INTRAVENOUS | Status: DC | PRN
  Administered 2012-03-28: 40 mg via INTRAVENOUS

## 2012-03-28 MED ORDER — SODIUM CHLORIDE 0.9 % IR SOLN
Status: DC | PRN
  Administered 2012-03-28: 3000 mL via INTRAVESICAL

## 2012-03-28 MED ORDER — SULFAMETHOXAZOLE-TMP DS 800-160 MG PO TABS
1.0000 | ORAL_TABLET | Freq: Once | ORAL | Status: DC
  Filled 2012-03-28: qty 1

## 2012-03-28 MED ORDER — FENTANYL CITRATE 0.05 MG/ML IJ SOLN
25.0000 ug | INTRAMUSCULAR | Status: DC | PRN
  Administered 2012-03-28 (×2): 50 ug via INTRAVENOUS

## 2012-03-28 SURGICAL SUPPLY — 19 items
ADAPTER CATH URET PLST 4-6FR (CATHETERS) ×2 IMPLANT
ADPR CATH URET STRL DISP 4-6FR (CATHETERS) ×1
BAG URO CATCHER STRL LF (DRAPE) ×2 IMPLANT
BASKET ZERO TIP NITINOL 2.4FR (BASKET) IMPLANT
BSKT STON RTRVL ZERO TP 2.4FR (BASKET)
CATH INTERMIT  6FR 70CM (CATHETERS) IMPLANT
CATH URET 5FR 28IN OPEN ENDED (CATHETERS) ×2 IMPLANT
CLOTH BEACON ORANGE TIMEOUT ST (SAFETY) ×2 IMPLANT
DRAPE CAMERA CLOSED 9X96 (DRAPES) ×2 IMPLANT
GLOVE BIOGEL M 8.0 STRL (GLOVE) ×2 IMPLANT
GLOVE SURG SS PI 8.0 STRL IVOR (GLOVE) ×2 IMPLANT
GOWN PREVENTION PLUS XLARGE (GOWN DISPOSABLE) ×2 IMPLANT
GOWN STRL REIN XL XLG (GOWN DISPOSABLE) ×2 IMPLANT
GUIDEWIRE STR DUAL SENSOR (WIRE) ×1 IMPLANT
MANIFOLD NEPTUNE II (INSTRUMENTS) ×2 IMPLANT
MARKER SKIN DUAL TIP RULER LAB (MISCELLANEOUS) ×2 IMPLANT
PACK CYSTO (CUSTOM PROCEDURE TRAY) ×2 IMPLANT
TOWEL OR 17X26 10 PK STRL BLUE (TOWEL DISPOSABLE) ×1 IMPLANT
TUBING CONNECTING 10 (TUBING) ×2 IMPLANT

## 2012-03-28 NOTE — H&P (View-Only) (Signed)
Subjective: [name] Bass is still having some pain, but less so than yesterday. No stone has been recovered yet.  Objective: Vital signs in last 24 hours: Temp:  [97.8 F (36.6 C)-98.5 F (36.9 C)] 98.1 F (36.7 C) (08/07 1400) Pulse Rate:  [84-93] 91  (08/07 1400) Resp:  [15-20] 20  (08/07 1425) BP: (107-131)/(69-80) 131/76 mmHg (08/07 1400) SpO2:  [97 %-100 %] 100 % (08/07 1425)  Intake/Output from previous day:   Intake/Output this shift:    Physical Exam:  Constitutional: Vital signs reviewed. She seems comfortable    Lab Results: No results found for this basename: HGB:3,HCT:3 in the last 72 hours BMET No results found for this basename: NA:2,K:2,CL:2,CO2:2,GLUCOSE:2,BUN:2,CREATININE:2,CALCIUM:2 in the last 72 hours No results found for this basename: LABPT:3,INR:3 in the last 72 hours No results found for this basename: LABURIN:1 in the last 72 hours Results for orders placed during the hospital encounter of 03/23/12  URINE CULTURE     Status: Normal   Collection Time   03/23/12  2:38 PM      Component Value Range Status Comment   Specimen Description URINE, CLEAN CATCH   Final    Special Requests NONE   Final    Culture  Setup Time 03/23/2012 17:09   Final    Colony Count NO GROWTH   Final    Culture NO GROWTH   Final    Report Status 03/24/2012 FINAL   Final   CULTURE, BETA STREP (GROUP B ONLY)     Status: Normal   Collection Time   03/23/12  3:00 PM      Component Value Range Status Comment   Specimen Description VAGINAL/RECTAL   Final    Special Requests NONE   Final    Culture NO GROUP B STREP (S.AGALACTIAE) ISOLATED   Final    Report Status 03/26/2012 FINAL   Final   WET PREP, GENITAL     Status: Abnormal   Collection Time   03/23/12  3:00 PM      Component Value Range Status Comment   Yeast Wet Prep HPF POC NONE SEEN  NONE SEEN Final    Trich, Wet Prep NONE SEEN  NONE SEEN Final    Clue Cells Wet Prep HPF POC NONE SEEN  NONE SEEN Final    WBC, Wet Prep HPF  POC FEW (*) NONE SEEN Final BACTERIA- TOO NUMEROUS TO COUNT    Studies/Results: Us Renal  03/26/2012  *RADIOLOGY REPORT*  Clinical Data: Right hydronephrosis and 20-week pregnant patient  RENAL/URINARY TRACT ULTRASOUND COMPLETE  Comparison:  MRI dated March 23, 2012  Findings:  Right Kidney:  Moderate right-sided hydronephrosis persists with a similar appearance compared to the recent MRI scan.  The visualized portion of the ureter is also dilated.  A 7 mm echogenic focus is present at the right UVJ.  The kidney remains normal in size, measuring 14.3 cm,  and the parenchyma is preserved.  Left Kidney:  Normal in size and echotexture, measuring 14.5 cm. No evidence of mass or hydronephrosis.  Bladder:  Normal for degree of distention.  Bilateral ureteral jets are demonstrated.  IMPRESSION: There is a partially obstructing 7 mm calculus at the right UVJ with stable, moderate right hydronephrosis.  A right ureteral jet is demonstrated during the exam, however.  Original Report Authenticated By: CARON B. DOVER, M.D.    Assessment/Plan:   Right distal ureteral stone and [redacted] week pregnant female. Thus far, she has not passed it, and this is the fifth   hospital day for pain management. Because of persistent stone in pain, the patient desires to proceed with ureteroscopy and stone extraction. This procedure was discussed in detail with the patient and family members. Risks and complications were also discussed including but not limited to preterm labor, anesthetic complications, infection, ureteral injury, blood in the urine, among others. She understands these, and desires to proceed.    According to the obstetrical patients in procedural areas guidelines from 2012, the patient does not need fetal monitoring. However, I would feel more comfortable with pre-and postprocedural fetal heart tones in the holding area and recovery room, respectively. Additionally, neonatology consult was not required. We have the  okay from the patient's obstetrical team for this.    I have ordered a renal/ureteral ultrasound to be done at approximately 10:00 tomorrow to verify that the stone is still in the distal ureter. The surgery is planned for approximately 1:30 tomorrow.     Keary Waterson M 03/27/2012, 5:54 PM     

## 2012-03-28 NOTE — Anesthesia Preprocedure Evaluation (Addendum)
Anesthesia Evaluation  Patient identified by MRN, date of birth, ID band Patient awake    Reviewed: Allergy & Precautions, H&P , NPO status , Patient's Chart, lab work & pertinent test results  Airway Mallampati: II TM Distance: >3 FB Neck ROM: Full    Dental  (+) Teeth Intact and Dental Advisory Given   Pulmonary neg pulmonary ROS,  breath sounds clear to auscultation  Pulmonary exam normal       Cardiovascular negative cardio ROS  Rhythm:Regular Rate:Normal     Neuro/Psych negative neurological ROS  negative psych ROS   GI/Hepatic negative GI ROS, Neg liver ROS,   Endo/Other  negative endocrine ROS  Renal/GU negative Renal ROS  negative genitourinary   Musculoskeletal negative musculoskeletal ROS (+)   Abdominal   Peds negative pediatric ROS (+)  Hematology negative hematology ROS (+)   Anesthesia Other Findings   Reproductive/Obstetrics (+) Pregnancy (20 weeks)                           Anesthesia Physical Anesthesia Plan  ASA: II  Anesthesia Plan: General   Post-op Pain Management:    Induction: Intravenous  Airway Management Planned: Oral ETT  Additional Equipment:   Intra-op Plan:   Post-operative Plan: Extubation in OR  Informed Consent: I have reviewed the patients History and Physical, chart, labs and discussed the procedure including the risks, benefits and alternatives for the proposed anesthesia with the patient or authorized representative who has indicated his/her understanding and acceptance.   Dental advisory given  Plan Discussed with: CRNA  Anesthesia Plan Comments:         Anesthesia Quick Evaluation

## 2012-03-28 NOTE — Op Note (Signed)
Preoperative diagnosis: 7 mm right calculus, interuterine pregnancy 21 weeks  Postoperative diagnosis: Same  Principal procedure: Cystoscopy, right ureteroscopic stone extraction  Surgeon: Patryk Conant  Anesthesia: Gen.  Specimen: Stone, the patient's husband  Drains: None  Complications: None  Indications: 26 year old white female at [redacted] week gestation, with a 6 day history of symptomatic 7 mm right distal ureteral stone. She's been the hospital since presentation. At this point, she presents for stone management under anesthesia due to her stone nonpassive. We have discussed risks and complications of an anesthetic removal of the stone with the patient and her family at length. She understands these and desires to proceed.  Description of procedure: The patient was properly identified and marked in the holding area. The heart tones were checked and were normal. She received preoperative IV antibiotics. She was then taken to the operating room where general anesthetic was administered. She was then placed in the dorsolithotomy position. Genitalia and perineum were prepped and draped. Proper timeout was then performed.  The procedure then commenced. A 22 French panendoscope was advanced to the bladder which was then drained and fully inspected. The bladder was normal as were the ureteral orifice is except for the right side which revealed a stone crowning. Only a small segment of the stone was visible. I then passed a guidewire without the use of fluoroscopy, and then placed a 6 French rigid ureteroscope after the cystoscope was removed. It pushed the stone back up into the ureter about 1 cm, grasped with the Nitinol basket, and extracted it. I then reinspected the ureter all the way to the renal pelvis. No further stone was seen. I then looked at the UVJ with the ureteroscope and no significant inflammation was noted. I did not think that she needed a stent at this point. The bladder was drained and  the procedure terminated. I did not use fluoroscopy during the procedure. She was taken to the PACU in stable condition where fetal heart will be checked.

## 2012-03-28 NOTE — Progress Notes (Signed)
Pt sent to [facility]  Report called to or  [place]

## 2012-03-28 NOTE — Transfer of Care (Signed)
Immediate Anesthesia Transfer of Care Note  Patient: [name] Bass  Procedure(s) Performed: Procedure(s) (LRB): CYSTOSCOPY WITH URETEROSCOPY (Right) STONE EXTRACTION WITH BASKET (Right)  Patient Location: PACU  Anesthesia Type: General  Level of Consciousness: awake, alert , oriented and patient cooperative  Airway & Oxygen Therapy: Patient Spontanous Breathing and Patient connected to face mask oxygen  Post-op Assessment: Report given to PACU RN, Post -op Vital signs reviewed and stable and Patient moving all extremities X 4  Post vital signs: Reviewed and stable  Complications: No apparent anesthesia complications

## 2012-03-28 NOTE — Anesthesia Postprocedure Evaluation (Signed)
Anesthesia Post Note  Patient: [name] Bass  Procedure(s) Performed: Procedure(s) (LRB): CYSTOSCOPY WITH URETEROSCOPY (Right) STONE EXTRACTION WITH BASKET (Right)  Anesthesia type: General  Patient location: PACU  Post pain: Pain level controlled  Post assessment: Post-op Vital signs reviewed  Last Vitals:  Filed Vitals:   03/28/12 1500  BP: 119/68  Pulse: 82  Temp:   Resp: 12    Post vital signs: Reviewed  Level of consciousness: sedated  Complications: No apparent anesthesia complications

## 2012-03-28 NOTE — Progress Notes (Signed)
OB assessment shows no vaginal bleeding, no leakage of fluid, and no abd tenderness.

## 2012-03-28 NOTE — Interval H&P Note (Signed)
History and Physical Interval Note:  03/28/2012 1:39 PM  [name] Bass  has presented today for surgery, with the diagnosis of RIGHT URETERAL STONE  The various methods of treatment have been discussed with the patient and family. After consideration of risks, benefits and other options for treatment, the patient has consented to  Procedure(s) (LRB): CYSTOSCOPY WITH URETEROSCOPY (Right) HOLMIUM LASER APPLICATION (Right) as a surgical intervention .  The patient's history has been reviewed, patient examined, no change in status, stable for surgery.  I have reviewed the patient's chart and labs.  Questions were answered to the patient's satisfaction.     Chelsea Aus

## 2012-03-28 NOTE — Preoperative (Signed)
Beta Blockers   Reason not to administer Beta Blockers:Not Applicable 

## 2012-03-28 NOTE — Progress Notes (Addendum)
[name] Bass is a 26 y.o. G3P2002 at [redacted]w[redacted]d  Patient Active Problem List  Diagnosis  . Lower abdominal pain  . High risk HPV infection  . History of PCOS  . Cervical intraepithelial neoplasia I  . HSV (herpes simplex virus) anogenital infection  . Abnormal Pap smear  . Polycystic ovarian syndrome  . CIN I (cervical intraepithelial neoplasia I)  . Herpes simplex without mention of complication  . FHx: suicide  . Normal pregnancy  . Kidney stone on right side    Subjective: denies srom, vag bleeding, uc, with +FM, states pain is about gone the past few days, passed some stones and tube is still blocked  Pregnancy complications:   Objective: BP 123/80  Pulse 88  Temp 98.2 F (36.8 C) (Oral)  Resp 18  Ht 5\' 10"  (1.778 m)  Wt 218 lb (98.884 kg)  BMI 31.28 kg/m2  SpO2 97%  LMP 10/06/2011 I/O last 3 completed shifts: In: 5052.9 [P.O.:530; I.V.:4522.9] Out: 5725 [Urine:5725] Total I/O In: -  Out: 500 [Urine:500]  Physical Exam:  Gen: calm, no distress Chest/Lungs: cta bilaterally Neg CVAT bilaterally Heart/Pulse: RRR  Abdomen: soft, gravid, nontender, Bowel sounds active Uterine fundus: soft, nontender Skin & Color: warm and dry  EXT: negative Homan's b/l, edema none FHTS 158 doppler     Labs: Lab Results  Component Value Date   WBC 13.9* 03/24/2012   HGB 10.4* 03/24/2012   HCT 31.0* 03/24/2012   MCV 91.7 03/24/2012   PLT 179 03/24/2012   A 20 6/7 week IUP     Kidney stones P for lithotripsy today, continue care Lavera Guise, CNM 03/28/2012, 11:16 AM   Pt is back from surgery.  She c/o pain.  Will try to control with po pain meds.  Currently she denies any n/v.  Will observe overnight.  FHTs 154.

## 2012-03-29 ENCOUNTER — Encounter (HOSPITAL_COMMUNITY): Payer: Self-pay | Admitting: Urology

## 2012-03-29 MED ORDER — OXYCODONE-ACETAMINOPHEN 5-325 MG PO TABS: 1.0000 | ORAL_TABLET | ORAL | Status: AC | PRN

## 2012-03-29 MED ORDER — IBUPROFEN 600 MG PO TABS
600.0000 mg | ORAL_TABLET | Freq: Four times a day (QID) | ORAL | Status: DC | PRN
  Administered 2012-03-29: 600 mg via ORAL
  Filled 2012-03-29: qty 1

## 2012-03-29 NOTE — Progress Notes (Signed)
Teaching complete   Questions answered  Ambulated out

## 2012-03-29 NOTE — Discharge Summary (Signed)
Physician Discharge Summary  Patient ID: [name] Bass MRN: [id number] DOB/AGE: Nov 21, 1985 26 y.o.  Admit date: 03/23/2012 Discharge date: 03/29/2012  Admission Diagnoses:pregnancy 21 weeks    Right kidney stone  Discharge Diagnoses: same status post removal of a right kidney stone transurethrally  Active Problems:  Kidney stone on right side   Discharged Condition: Improved  Hospital Course:the patient was admitted to the hospital and after MRI was noted to have a right kidney stone. She was hydrated and given intravenous pain medication without any significant relief and her severe pain. On 03/28/2012 she underwent transurethral removal of the right kidney stone by Dr. Retta Diones.  Postoperatively she did well with decrease in her pain such that on postoperative day one she got adequate pain relief with oral pain medication. She was thus deemed to be ready for discharge home. Dr. Retta Diones  confirmed that the patient did not need any antibiotic therapy after discharge.  Consults: urology  Significant Diagnostic Studies: abdominal MRI  Treatments: surgery: right kidney stone removal  Discharge Exam: Blood pressure 110/75, pulse 100, temperature 98.7 F (37.1 C), temperature source Oral, resp. rate 18, height 5\' 10"  (1.778 m), weight 215 lb 1 oz (97.552 kg), last menstrual period 10/06/2011, SpO2 96.00%. Back: no CVA tenderness Fetal heart tones 159 beats per minute  Disposition: 01-Home or Self Care  Discharge Orders    Future Appointments: Provider: Department: Dept Phone: Center:   04/11/2012 4:30 PM Nigel Bridgeman, CNM [facility] [phone] None     Medication List  As of 03/29/2012  1:25 PM   STOP taking these medications         metroNIDAZOLE 500 MG tablet      nitrofurantoin (macrocrystal-monohydrate) 100 MG capsule         TAKE these medications         FOLIC ACID PO   Take 1 tablet by mouth daily.      oxyCODONE-acetaminophen 5-325 MG per tablet   Commonly known as: PERCOCET/ROXICET   Take 1-2 tablets by mouth every 3 (three) hours as needed (moderate to severe pain (when tolerating fluids)).      pantoprazole 20 MG tablet   Commonly known as: PROTONIX   Take 1 tablet (20 mg total) by mouth daily.           Follow-up Information    Follow up with Regnald Bowens P, MD in 3 weeks. (pt has appt)    Contact information:   [address] [place] [address] [phone]          Signed: Hal Morales 03/29/2012, 1:25 PM

## 2012-03-29 NOTE — Progress Notes (Signed)
This was a follow-up visit with [name] Bass, whom I first saw on Monday.  She was feeling much better and very much relieved.  She was excited to be going home soon and excited that she would be able to see her kids again.  She has a friend coming to stay with her over the weekend so that her husband can go back to work.  We shared a prayer together.  [employer] Pager, [phone] 10:47 AM   03/29/12 1000  Clinical Encounter Type  Visited With Patient  Visit Type Spiritual support;Follow-up  Spiritual Encounters  Spiritual Needs Prayer

## 2012-04-11 ENCOUNTER — Encounter: Payer: Self-pay | Admitting: Obstetrics and Gynecology

## 2012-04-11 ENCOUNTER — Ambulatory Visit (INDEPENDENT_AMBULATORY_CARE_PROVIDER_SITE_OTHER): Payer: Medicaid Other | Admitting: Obstetrics and Gynecology

## 2012-04-11 VITALS — BP 102/60 | Wt 212.5 lb

## 2012-04-11 DIAGNOSIS — N200 Calculus of kidney: Secondary | ICD-10-CM

## 2012-04-11 NOTE — Progress Notes (Signed)
Doing well since removal of kidney stone on 8/8.  No further pain. Had negative MRSA screen and + staph aureus screen pre-op. Reviewed that no significant risks are associated at present re:  Staph aureus for patient or baby. Glucola NV.

## 2012-04-11 NOTE — Progress Notes (Signed)
Quad screen WNL 02/2312 Pt reqs clarification hospital lab 03/27/12

## 2012-05-08 ENCOUNTER — Encounter (HOSPITAL_COMMUNITY): Payer: Self-pay

## 2012-05-08 ENCOUNTER — Inpatient Hospital Stay (HOSPITAL_COMMUNITY)
Admission: AD | Admit: 2012-05-08 | Discharge: 2012-05-08 | Disposition: A | Discharge status: Home / Self Care | Payer: Medicaid Other | Source: Ambulatory Visit | Attending: Obstetrics and Gynecology | Admitting: Obstetrics and Gynecology

## 2012-05-08 DIAGNOSIS — R55 Syncope and collapse: Secondary | ICD-10-CM

## 2012-05-08 DIAGNOSIS — Z331 Pregnant state, incidental: Secondary | ICD-10-CM

## 2012-05-08 DIAGNOSIS — O2650 Maternal hypotension syndrome, unspecified trimester: Secondary | ICD-10-CM | POA: Insufficient documentation

## 2012-05-08 LAB — CBC WITH DIFFERENTIAL/PLATELET
Basophils Relative: 0 % (ref 0–1)
HCT: 34.3 % — ABNORMAL LOW (ref 36.0–46.0)
Hemoglobin: 11.8 g/dL — ABNORMAL LOW (ref 12.0–15.0)
MCH: 31 pg (ref 26.0–34.0)
MCHC: 34.4 g/dL (ref 30.0–36.0)
Monocytes Absolute: 0.5 10*3/uL (ref 0.1–1.0)
Monocytes Relative: 4 % (ref 3–12)
Neutro Abs: 10.2 10*3/uL — ABNORMAL HIGH (ref 1.7–7.7)

## 2012-05-08 LAB — COMPREHENSIVE METABOLIC PANEL
Albumin: 2.9 g/dL — ABNORMAL LOW (ref 3.5–5.2)
BUN: 6 mg/dL (ref 6–23)
Chloride: 102 mEq/L (ref 96–112)
Creatinine, Ser: 0.46 mg/dL — ABNORMAL LOW (ref 0.50–1.10)
GFR calc Af Amer: >90 mL/min (ref >90)
GFR calc non Af Amer: >90 mL/min (ref >90)
Total Bilirubin: 0.2 mg/dL — ABNORMAL LOW (ref 0.3–1.2)

## 2012-05-08 LAB — URINALYSIS, ROUTINE W REFLEX MICROSCOPIC
Glucose, UA: NEGATIVE mg/dL
Nitrite: NEGATIVE
Protein, ur: 30 mg/dL — AB
Urobilinogen, UA: 0.2 mg/dL (ref 0.0–1.0)

## 2012-05-08 LAB — URINE MICROSCOPIC-ADD ON

## 2012-05-08 MED ORDER — SODIUM CHLORIDE 0.9 % IV BOLUS (SEPSIS)
1000.0000 mL | Freq: Once | INTRAVENOUS | Status: AC
  Administered 2012-05-08: 1000 mL via INTRAVENOUS

## 2012-05-08 NOTE — MAU Provider Note (Signed)
History    [name] had been at the [facility] office today and had a Syncope Attack and had LOC Roused and became oriented and alert quickly. She had water to drink and crackers and felt better. Fetal hearts ( Doppler) were taken at the H/ Dept which were 135 bpm. The patient was transferred by EMS with IV access to MAU for Fetal Evaluation with NST [redacted]w[redacted]d  The patient did not hit her head ot have any other injuries at the time of the Syncope. She had only a Pop tart  To eat early this morning with minimal hydration.  CSN: [id number]  Arrival date and time: 05/08/12 1054   None     Chief Complaint  Patient presents with  . Loss of Consciousness   HPI  OB History    Grav Para Term Preterm Abortions TAB SAB Ect Mult Living   3 2 2       2       Past Medical History  Diagnosis Date  . Abnormal Pap smear   . Polycystic ovarian syndrome 05/08/05  . 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 CIN I (cervical intraepithelial neoplasia I)   . Herpes simplex without mention of complication   . FHx: suicide   . H/O amenorrhea 02/19/04  . Vaginitis and vulvovaginitis 04/29/04  . Bacterial vaginosis 04/29/04  . Dysuria 04/04/05  . Irregular periods/menstrual cycles   . Hx: UTI (urinary tract infection)   . Dyspareunia 05/02/10  . Herpes simplex type 2 infection     "last outbreak 3 weeks ago"    Past Surgical History  Procedure Date  . Cholecystectomy, laparoscopic 2010  . Wisdom tooth extraction 2001  . Tonsillectomy 2006  . Cystoscopy with ureteroscopy 03/28/2012    Procedure: CYSTOSCOPY WITH URETEROSCOPY;  Surgeon: Marcine Matar, MD;  Location: [facility];  Service: Urology;  Laterality: Right;  RIGHT URETEROSCOPIC STONE EXTRACTION W/ LASER (Pt is [redacted] wk pregnant)    . Stone extraction with basket 03/28/2012    Procedure: STONE EXTRACTION WITH BASKET;  Surgeon: Marcine Matar, MD;  Location: [facility];  Service: Urology;  Laterality: Right;    Family History  Problem Relation Age  of Onset  . Heart disease Father     heart attack in oct.  lm  . Cancer Maternal Uncle     bone cancer  . Cancer Maternal Grandmother     ovarian cancer  . Suicidality Mother     History  Substance Use Topics  . Smoking status: Never Smoker   . Smokeless tobacco: Not on file  . Alcohol Use: No     sometimes    Allergies:  Allergies  Allergen Reactions  . Amoxicillin Rash    Prescriptions prior to admission  Medication Sig Dispense Refill  . Prenatal Vit-Fe Fumarate-FA (MULTIVITAMIN-PRENATAL) 27-0.8 MG TABS Take 1 tablet by mouth daily.        Review of Systems  Constitutional: Negative.   Eyes: Negative.   Respiratory: Negative.   Cardiovascular: Negative.   Gastrointestinal: Negative.   Genitourinary: Negative.   Musculoskeletal: Negative.   Skin: Negative.   Endo/Heme/Allergies: Negative.   Psychiatric/Behavioral: Negative.    Physical Exam   Blood pressure 123/72, pulse 91, temperature 97.8 F (36.6 C), temperature source Oral, resp. rate 18, last menstrual period 10/06/2011, SpO2 96.00%.  Physical Exam  Constitutional: She is oriented to person, place, and time. She appears well-developed and well-nourished.  HENT:  Head: Normocephalic and atraumatic.  Eyes: Conjunctivae normal and  EOM are normal. Pupils are equal, round, and reactive to light.  Neck: Normal range of motion. Neck supple.  Cardiovascular: Normal rate, regular rhythm and normal heart sounds.   Respiratory: Effort normal and breath sounds normal.  GI: Soft. Bowel sounds are normal.  Genitourinary: Vagina normal and uterus normal.       uterus to dates  Musculoskeletal: Normal range of motion.  Neurological: She is alert and oriented to person, place, and time. She has normal reflexes.  Skin: Skin is warm and dry.  Psychiatric: She has a normal mood and affect.    MAU Course  Procedures  NST Reactive with Baseline 145 bpm Cat 1 Tracing  Assessment and Plan  Normal NST F/U in am  with [facility]  for scheduled  Office visit.  Jonna Dittrich, CNM. 05/08/2012, 4:01 PM

## 2012-05-08 NOTE — ED Provider Notes (Signed)
History     CSN: [id number]  Arrival date & time 05/08/12  1054   First MD Initiated Contact with Patient 05/08/12 1055      Chief Complaint  Patient presents with  . Loss of Consciousness    (Consider location/radiation/quality/duration/timing/severity/associated sxs/prior treatment) HPI This G3P2 at approximately 6 weeks presents following a syncopal episode.  She notes that she was in her usual state of health, with an unremarkable pregnancy thus far. She states that she was a breast-feeding class, and had had little by mouth intake prior to the event when she felt lightheaded, warm, and lost consciousness for a few moments.  She denies any complaints on recovery, such as chest pain, headache confusion, disorientation, vomiting. She continues to have good fetal motion. Past Medical History  Diagnosis Date  . Abnormal Pap smear   . Polycystic ovarian syndrome 05/08/05  . 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 CIN I (cervical intraepithelial neoplasia I)   . Herpes simplex without mention of complication   . FHx: suicide   . H/O amenorrhea 02/19/04  . Vaginitis and vulvovaginitis 04/29/04  . Bacterial vaginosis 04/29/04  . Dysuria 04/04/05  . Irregular periods/menstrual cycles   . Hx: UTI (urinary tract infection)   . Dyspareunia 05/02/10  . Herpes simplex type 2 infection     "last outbreak 3 weeks ago"    Past Surgical History  Procedure Date  . Cholecystectomy, laparoscopic 2010  . Wisdom tooth extraction 2001  . Tonsillectomy 2006  . Cystoscopy with ureteroscopy 03/28/2012    Procedure: CYSTOSCOPY WITH URETEROSCOPY;  Surgeon: Marcine Matar, MD;  Location: [facility];  Service: Urology;  Laterality: Right;  RIGHT URETEROSCOPIC STONE EXTRACTION W/ LASER (Pt is [redacted] wk pregnant)    . Stone extraction with basket 03/28/2012    Procedure: STONE EXTRACTION WITH BASKET;  Surgeon: Marcine Matar, MD;  Location: [facility];  Service: Urology;  Laterality: Right;    Family History    Problem Relation Age of Onset  . Heart disease Father     heart attack in oct.  lm  . Cancer Maternal Uncle     bone cancer  . Cancer Maternal Grandmother     ovarian cancer  . Suicidality Mother     History  Substance Use Topics  . Smoking status: Never Smoker   . Smokeless tobacco: Not on file  . Alcohol Use: No     sometimes    OB History    Grav Para Term Preterm Abortions TAB SAB Ect Mult Living   3 2 2       2       Review of Systems  Constitutional:       HPI  HENT:       HPI otherwise negative  Eyes: Negative.   Respiratory:       HPI, otherwise negative  Cardiovascular:       HPI, otherwise nmegative  Gastrointestinal: Negative for vomiting.  Genitourinary:       HPI, otherwise negative  Musculoskeletal:       HPI, otherwise negative  Skin: Negative.   Neurological: Positive for syncope. Negative for seizures and weakness.    Allergies  Amoxicillin  Home Medications   Current Outpatient Rx  Name Route Sig Dispense Refill  . PRENATAL 27-0.8 MG PO TABS Oral Take 1 tablet by mouth daily.      BP 109/49  Pulse 90  Temp 98.9 F (37.2 C) (Oral)  Resp 18  SpO2 96%  LMP  10/06/2011  Physical Exam  Nursing note and vitals reviewed. Constitutional: She is oriented to person, place, and time. She appears well-developed and well-nourished. No distress.  HENT:  Head: Normocephalic and atraumatic.  Eyes: Conjunctivae normal and EOM are normal.  Cardiovascular: Normal rate and regular rhythm.   Pulmonary/Chest: Effort normal and breath sounds normal. No stridor. No respiratory distress.  Abdominal: She exhibits no distension.       Gravid uterus, palpable.  Musculoskeletal: She exhibits no edema.  Neurological: She is alert and oriented to person, place, and time. No cranial nerve deficit. She exhibits normal muscle tone. Coordination normal.  Skin: Skin is warm and dry.  Psychiatric: She has a normal mood and affect.    ED Course  [place]  bedside Date/Time: 05/08/2012 1:00 PM Performed by: Gerhard Munch Authorized by: Gerhard Munch Consent: Verbal consent obtained. Written consent not obtained. The procedure was performed in an emergent situation. Risks and benefits: risks, benefits and alternatives were discussed Consent given by: patient Patient identity confirmed: verbally with patient Time out: Immediately prior to procedure a "time out" was called to verify the correct patient, procedure, equipment, support staff and site/side marked as required. Local anesthesia used: no Patient sedated: no Patient tolerance: Patient tolerated the procedure well with no immediate complications. Comments: BS [place] w good fetal motion, FHT ~130   (including critical care time)  Labs Reviewed  CBC WITH DIFFERENTIAL - Abnormal; Notable for the following:    WBC 12.7 (*)     RBC 3.81 (*)     Hemoglobin 11.8 (*)     HCT 34.3 (*)     Neutrophils Relative 80 (*)     Neutro Abs 10.2 (*)     All other components within normal limits  COMPREHENSIVE METABOLIC PANEL - Abnormal; Notable for the following:    Potassium 3.2 (*)     Glucose, Bld 109 (*)     Creatinine, Ser 0.46 (*)     Albumin 2.9 (*)     Total Bilirubin 0.2 (*)     All other components within normal limits  URINALYSIS, ROUTINE W REFLEX MICROSCOPIC - Abnormal; Notable for the following:    APPearance CLOUDY (*)     Protein, ur 30 (*)     Leukocytes, UA TRACE (*)     All other components within normal limits  URINE MICROSCOPIC-ADD ON - Abnormal; Notable for the following:    Squamous Epithelial / LPF FEW (*)     All other components within normal limits   No results found.   1. Syncope     Cardiac: 80sr, normal Pulse ox 99%ra, normal   Date: 05/08/2012  Rate: 79  Rhythm: normal sinus rhythm  QRS Axis: normal  Intervals: normal  ST/T Wave abnormalities: normal  Conduction Disutrbances:none  Narrative Interpretation:   Old EKG Reviewed: none  available NORMAL ECG   MDM  This generally well-appearing female presents following syncopal episode.  Notably, the patient is 6 months pregnant.  On exam she is in no distress with unremarkable vital signs.  Following a non-markable evaluation as far, the patient was transferred to [facility] hospital for further monitoring and management.  Given the patient's description of by mouth intake prior to the event there suspicion of vagal mediation, though additional monitoring and evaluation is required.     Gerhard Munch, MD 05/08/12 [phone]

## 2012-05-08 NOTE — ED Notes (Signed)
Per EMS, pt is [redacted] weeks pregnant and in class sitting down. Felt hot and passed out. Did not fall out of chair. Staff there placed her in another room on the floor on her left side. Pt only c/o HA just PTA and a little dizziness. 22g to [facility]. CBG 77

## 2012-05-09 ENCOUNTER — Ambulatory Visit (INDEPENDENT_AMBULATORY_CARE_PROVIDER_SITE_OTHER): Payer: Medicaid Other | Admitting: Obstetrics and Gynecology

## 2012-05-09 ENCOUNTER — Other Ambulatory Visit: Payer: Medicaid Other

## 2012-05-09 ENCOUNTER — Encounter: Payer: Self-pay | Admitting: Obstetrics and Gynecology

## 2012-05-09 VITALS — BP 120/78 | Wt 215.0 lb

## 2012-05-09 DIAGNOSIS — Z331 Pregnant state, incidental: Secondary | ICD-10-CM

## 2012-05-09 LAB — HEMOGLOBIN: Hemoglobin: 12.1 g/dL (ref 12.0–15.0)

## 2012-05-09 NOTE — Progress Notes (Signed)
Patient ID: [name] Bass, female   DOB: 01/06/1986, 26 y.o.   MRN: [id number] [redacted]w[redacted]d 1 gtt today, A positive Plans BTL papers today. Reviewed s/s preterm labor, srom, vag bleeding,daily kick counts to report, encouraged 8 water daily and frequent voids. Lavera Guise, CNM

## 2012-05-09 NOTE — Addendum Note (Signed)
Addended by: Darien Ramus on: 05/09/2012 10:37 AM   Modules accepted: Orders

## 2012-05-09 NOTE — Progress Notes (Signed)
Pt states she passed out yesterday and she went to the hospital. They states they feel like it was the pt blood sugar.Pt only ate a pop tart all day long. Glucola given.

## 2012-05-13 ENCOUNTER — Telehealth: Payer: Self-pay | Admitting: Obstetrics and Gynecology

## 2012-05-13 ENCOUNTER — Other Ambulatory Visit: Payer: Self-pay | Admitting: Obstetrics and Gynecology

## 2012-05-13 DIAGNOSIS — O99810 Abnormal glucose complicating pregnancy: Secondary | ICD-10-CM

## 2012-05-13 NOTE — Telephone Encounter (Signed)
Notified pt of elevated 1 hr glucola.  Sch pt for 3 hr GTT on 05-20-2012 @ 8:00.  Mailed pt diet and appt info.

## 2012-05-21 LAB — GLUCOSE TOLERANCE, 3 HOURS
Glucose Tolerance, 2 hour: 142 mg/dL (ref 70–164)
Glucose Tolerance, Fasting: 91 mg/dL (ref 70–104)
Glucose, GTT - 3 Hour: 129 mg/dL (ref 70–144)

## 2012-05-23 ENCOUNTER — Ambulatory Visit (INDEPENDENT_AMBULATORY_CARE_PROVIDER_SITE_OTHER): Payer: Medicaid Other | Admitting: Obstetrics and Gynecology

## 2012-05-23 VITALS — BP 104/64 | Wt 217.0 lb

## 2012-05-23 DIAGNOSIS — N898 Other specified noninflammatory disorders of vagina: Secondary | ICD-10-CM

## 2012-05-23 DIAGNOSIS — Z23 Encounter for immunization: Secondary | ICD-10-CM

## 2012-05-23 LAB — POCT WET PREP (WET MOUNT): pH: 4.5

## 2012-05-23 NOTE — Progress Notes (Signed)
Doing well, but more d/c--making vulva slightly irritated due to wearing pad for comfort. Wet prep negative, no evidence leaking. Anticipates dental work soon--dental letter given. 3 hour GTT WNL. Watch FH. Signed BTL consent 05/09/12

## 2012-05-24 MED ORDER — INFLUENZA VAC TISS-CULT SUBUNT IM SUSP
0.5000 mL | Freq: Once | INTRAMUSCULAR | Status: AC
  Administered 2012-05-23: 0.5 mL via INTRAMUSCULAR

## 2012-05-24 NOTE — Addendum Note (Signed)
Addended by: Stephens Shire on: 05/24/2012 09:32 AM   Modules accepted: Orders

## 2012-06-06 ENCOUNTER — Ambulatory Visit (INDEPENDENT_AMBULATORY_CARE_PROVIDER_SITE_OTHER): Payer: Medicaid Other | Admitting: Obstetrics and Gynecology

## 2012-06-06 VITALS — BP 102/62 | Wt 220.0 lb

## 2012-06-06 DIAGNOSIS — Z23 Encounter for immunization: Secondary | ICD-10-CM

## 2012-06-06 DIAGNOSIS — Z331 Pregnant state, incidental: Secondary | ICD-10-CM

## 2012-06-06 NOTE — Progress Notes (Signed)
[redacted]w[redacted]d  GFM 3 hour GTT normal

## 2012-06-06 NOTE — Progress Notes (Signed)
Pt. Stated no issues today.  

## 2012-06-06 NOTE — Progress Notes (Signed)
Flu shot r/c at work

## 2012-06-20 ENCOUNTER — Ambulatory Visit (INDEPENDENT_AMBULATORY_CARE_PROVIDER_SITE_OTHER): Payer: Medicaid Other | Admitting: Obstetrics and Gynecology

## 2012-06-20 VITALS — BP 100/60 | Wt 220.0 lb

## 2012-06-20 DIAGNOSIS — Z3490 Encounter for supervision of normal pregnancy, unspecified, unspecified trimester: Secondary | ICD-10-CM

## 2012-06-20 DIAGNOSIS — Z331 Pregnant state, incidental: Secondary | ICD-10-CM

## 2012-06-20 NOTE — Progress Notes (Signed)
[redacted]w[redacted]d Problem list reports h/o herpes Rec valtrex at about 34wks Please address at [place] No complaints FKCs

## 2012-07-03 ENCOUNTER — Encounter: Payer: Self-pay | Admitting: Obstetrics and Gynecology

## 2012-07-03 ENCOUNTER — Ambulatory Visit (INDEPENDENT_AMBULATORY_CARE_PROVIDER_SITE_OTHER): Payer: Medicaid Other | Admitting: Obstetrics and Gynecology

## 2012-07-03 VITALS — BP 118/78 | Wt 224.0 lb

## 2012-07-03 DIAGNOSIS — Z331 Pregnant state, incidental: Secondary | ICD-10-CM

## 2012-07-03 NOTE — Progress Notes (Signed)
Pt without c/o GBS @[place] 

## 2012-07-03 NOTE — Progress Notes (Signed)
Pt declined flu shot °

## 2012-07-08 ENCOUNTER — Inpatient Hospital Stay (HOSPITAL_COMMUNITY): Payer: Medicaid Other

## 2012-07-08 ENCOUNTER — Observation Stay (HOSPITAL_COMMUNITY)
Admission: AD | Admit: 2012-07-08 | Discharge: 2012-07-09 | Disposition: A | Discharge status: Home / Self Care | Payer: Medicaid Other | Source: Ambulatory Visit | Attending: Obstetrics and Gynecology | Admitting: Obstetrics and Gynecology

## 2012-07-08 ENCOUNTER — Encounter (HOSPITAL_COMMUNITY): Payer: Self-pay

## 2012-07-08 DIAGNOSIS — O99810 Abnormal glucose complicating pregnancy: Secondary | ICD-10-CM | POA: Insufficient documentation

## 2012-07-08 DIAGNOSIS — Z3687 Encounter for antenatal screening for uncertain dates: Secondary | ICD-10-CM | POA: Insufficient documentation

## 2012-07-08 DIAGNOSIS — N200 Calculus of kidney: Secondary | ICD-10-CM | POA: Diagnosis present

## 2012-07-08 DIAGNOSIS — O26839 Pregnancy related renal disease, unspecified trimester: Principal | ICD-10-CM | POA: Insufficient documentation

## 2012-07-08 DIAGNOSIS — N1330 Unspecified hydronephrosis: Secondary | ICD-10-CM | POA: Insufficient documentation

## 2012-07-08 DIAGNOSIS — R109 Unspecified abdominal pain: Secondary | ICD-10-CM | POA: Insufficient documentation

## 2012-07-08 DIAGNOSIS — Z23 Encounter for immunization: Secondary | ICD-10-CM

## 2012-07-08 HISTORY — DX: Chronic kidney disease, unspecified: N18.9

## 2012-07-08 LAB — URINALYSIS, ROUTINE W REFLEX MICROSCOPIC
Bilirubin Urine: NEGATIVE
Glucose, UA: NEGATIVE mg/dL
Ketones, ur: NEGATIVE mg/dL
Leukocytes, UA: NEGATIVE
Nitrite: NEGATIVE
Protein, ur: NEGATIVE mg/dL

## 2012-07-08 LAB — CBC
Platelets: 214 10*3/uL (ref 150–400)
RDW: 14.3 % (ref 11.5–15.5)
WBC: 13 10*3/uL — ABNORMAL HIGH (ref 4.0–10.5)

## 2012-07-08 LAB — URINE MICROSCOPIC-ADD ON

## 2012-07-08 MED ORDER — SODIUM CHLORIDE 0.9 % IJ SOLN: 9.0000 mL | INTRAMUSCULAR | Status: DC | PRN

## 2012-07-08 MED ORDER — CALCIUM CARBONATE ANTACID 500 MG PO CHEW: 2.0000 | CHEWABLE_TABLET | ORAL | Status: DC | PRN

## 2012-07-08 MED ORDER — HYDROMORPHONE HCL PF 1 MG/ML IJ SOLN
2.0000 mg | INTRAMUSCULAR | Status: DC | PRN
  Administered 2012-07-08 (×2): 2 mg via INTRAVENOUS
  Filled 2012-07-08: qty 1
  Filled 2012-07-08: qty 2
  Filled 2012-07-08: qty 1

## 2012-07-08 MED ORDER — DOCUSATE SODIUM 100 MG PO CAPS
100.0000 mg | ORAL_CAPSULE | Freq: Every day | ORAL | Status: DC
  Administered 2012-07-09: 100 mg via ORAL
  Filled 2012-07-08: qty 1

## 2012-07-08 MED ORDER — ACETAMINOPHEN 325 MG PO TABS: 650.0000 mg | ORAL_TABLET | ORAL | Status: DC | PRN

## 2012-07-08 MED ORDER — ZOLPIDEM TARTRATE 5 MG PO TABS: 5.0000 mg | ORAL_TABLET | Freq: Every evening | ORAL | Status: DC | PRN

## 2012-07-08 MED ORDER — DIPHENHYDRAMINE HCL 50 MG/ML IJ SOLN
12.5000 mg | Freq: Four times a day (QID) | INTRAMUSCULAR | Status: DC | PRN
  Administered 2012-07-08 (×2): 12.5 mg via INTRAVENOUS
  Filled 2012-07-08 (×2): qty 1

## 2012-07-08 MED ORDER — HYDROMORPHONE HCL PF 1 MG/ML IJ SOLN
2.0000 mg | Freq: Once | INTRAMUSCULAR | Status: AC
  Administered 2012-07-08: 2 mg via INTRAVENOUS
  Filled 2012-07-08: qty 2

## 2012-07-08 MED ORDER — PRENATAL MULTIVITAMIN CH
1.0000 | ORAL_TABLET | Freq: Every day | ORAL | Status: DC
  Administered 2012-07-09: 1 via ORAL
  Filled 2012-07-08: qty 1

## 2012-07-08 MED ORDER — DIPHENHYDRAMINE HCL 12.5 MG/5ML PO ELIX
12.5000 mg | ORAL_SOLUTION | Freq: Four times a day (QID) | ORAL | Status: DC | PRN
  Filled 2012-07-08: qty 5

## 2012-07-08 MED ORDER — FENTANYL CITRATE 0.05 MG/ML IJ SOLN
100.0000 ug | INTRAMUSCULAR | Status: DC | PRN
  Administered 2012-07-08: 100 ug via INTRAVENOUS
  Filled 2012-07-08 (×2): qty 2

## 2012-07-08 MED ORDER — ACETAMINOPHEN 10 MG/ML IV SOLN
1000.0000 mg | Freq: Four times a day (QID) | INTRAVENOUS | Status: AC
  Administered 2012-07-08 – 2012-07-09 (×4): 1000 mg via INTRAVENOUS
  Filled 2012-07-08 (×4): qty 100

## 2012-07-08 MED ORDER — NALOXONE HCL 0.4 MG/ML IJ SOLN: 0.4000 mg | INTRAMUSCULAR | Status: DC | PRN

## 2012-07-08 MED ORDER — TAMSULOSIN HCL 0.4 MG PO CAPS
0.4000 mg | ORAL_CAPSULE | Freq: Every day | ORAL | Status: DC
  Administered 2012-07-08 – 2012-07-09 (×2): 0.4 mg via ORAL
  Filled 2012-07-08 (×3): qty 1

## 2012-07-08 MED ORDER — VALACYCLOVIR HCL 500 MG PO TABS
500.0000 mg | ORAL_TABLET | Freq: Every day | ORAL | Status: DC
  Administered 2012-07-09: 500 mg via ORAL
  Filled 2012-07-08 (×3): qty 1

## 2012-07-08 MED ORDER — HYDROMORPHONE 0.3 MG/ML IV SOLN
INTRAVENOUS | Status: DC
  Administered 2012-07-08: 16:00:00 via INTRAVENOUS
  Administered 2012-07-08: 14 mL via INTRAVENOUS
  Administered 2012-07-08: 09:00:00 via INTRAVENOUS
  Administered 2012-07-09: 0.3 mg via INTRAVENOUS
  Filled 2012-07-08 (×2): qty 25

## 2012-07-08 MED ORDER — LACTATED RINGERS IV SOLN
INTRAVENOUS | Status: DC
  Administered 2012-07-08 – 2012-07-09 (×6): via INTRAVENOUS

## 2012-07-08 MED ORDER — DIPHENHYDRAMINE HCL 50 MG/ML IJ SOLN
25.0000 mg | Freq: Once | INTRAMUSCULAR | Status: AC
  Administered 2012-07-08: 25 mg via INTRAVENOUS
  Filled 2012-07-08: qty 1

## 2012-07-08 MED ORDER — ONDANSETRON HCL 4 MG/2ML IJ SOLN: 4.0000 mg | Freq: Four times a day (QID) | INTRAMUSCULAR | Status: DC | PRN

## 2012-07-08 NOTE — Progress Notes (Signed)
Feeling much better now after 1000 mg IV Tylenol infused.  On Dilaudid PCA. Has not required supplemental Fentanyl dose.  Minimal itching.  Repeat Renal US: *RADIOLOGY REPORT*  Clinical Data: Bilateral flank pain. [redacted] weeks pregnant. Possible  renal calculi on prior ultrasound.   RENAL/URINARY TRACT ULTRASOUND COMPLETE  Comparison: 07/08/2012  Findings:  Right Kidney: Measures 14.1 cm in length. Normal parenchymal  echogenicity. No evidence of renal mass or abscess. Mild  pelvicaliectasis is demonstrated, without significant change. No  definite intrarenal calculi visualized.  Left Kidney: Measures 15.6 cm in length. Normal parenchymal  echogenicity. No evidence of renal mass or abscess. Mild  pelvicaliectasis is demonstrated, without significant change. No  definite intrarenal calculi visualized.  Bladder: Unremarkable in appearance for degree of bladder filling.  Intrauterine fetus seen in cephalic presentation.   IMPRESSION:  Bilateral renal pelvicaliectasis, without significant change.  Etiology is not visualized by ultrasound. This may represent  physiologic hydronephrosis of pregnancy, although an occult  ureteral calculus cannot definitely be excluded by ultrasound. If  clinically warranted, abdomen pelvis CT or MRI may be appropriate  for further evaluation.   Original Report Authenticated By: Myles Rosenthal, M.D.   Plan: Per consult with Dr. Estanislado Pandy, will continue IV Tylenol 1000 mg q 6 hours for 24 hours. IV hydration Straining all urine Pain medication prn, with Dilaudid PCA use.    Nigel Bridgeman, CNM 12:35p

## 2012-07-08 NOTE — MAU Note (Signed)
Left flank pain started tonight. States feels like previous kidney stones. Denies leaking of fluid or vaginal bleeding or contractions.

## 2012-07-08 NOTE — Progress Notes (Signed)
Hospital day # 0 pregnancy at [redacted]w[redacted]d--bilateral kidney stones, with pain on left side.  S:  Severe colicky pain from stone on left side--just received 3rd dose of IV Dilaudid, has required q 2 hours.  Had mild itching after 2nd dose, relieved with Benadryl.  Has been on Dilaudid PCA during last hospitalization at 21 weeks without rxn.  O: BP 132/64  Pulse 101  Temp 97.9 F (36.6 C) (Oral)  Resp 20  Ht 5\' 10"  (1.778 m)  Wt 223 lb 4.8 oz (101.288 kg)  BMI 32.04 kg/m2  LMP 10/06/2011      Fetal tracings:  q 30 minutes per shift      Contractions:   None per pt      Uterus gravid and non-tender      Extremities: no significant edema and no signs of DVT          Labs:   Results for orders placed during the hospital encounter of 07/08/12 (from the past 24 hour(s))  URINALYSIS, ROUTINE W REFLEX MICROSCOPIC     Status: Abnormal   Collection Time   07/08/12  3:05 AM      Component Value Range   Color, Urine YELLOW  YELLOW   APPearance CLEAR  CLEAR   Specific Gravity, Urine 1.010  1.005 - 1.030   pH 6.5  5.0 - 8.0   Glucose, UA NEGATIVE  NEGATIVE mg/dL   Hgb urine dipstick LARGE (*) NEGATIVE   Bilirubin Urine NEGATIVE  NEGATIVE   Ketones, ur NEGATIVE  NEGATIVE mg/dL   Protein, ur NEGATIVE  NEGATIVE mg/dL   Urobilinogen, UA 0.2  0.0 - 1.0 mg/dL   Nitrite NEGATIVE  NEGATIVE   Leukocytes, UA NEGATIVE  NEGATIVE  URINE MICROSCOPIC-ADD ON     Status: Abnormal   Collection Time   07/08/12  3:05 AM      Component Value Range   Squamous Epithelial / LPF FEW (*) RARE   WBC, UA 0-2  <3 WBC/hpf   RBC / HPF 21-50  <3 RBC/hpf   Bacteria, UA MANY (*) RARE  CBC     Status: Abnormal   Collection Time   07/08/12  3:37 AM      Component Value Range   WBC 13.0 (*) 4.0 - 10.5 K/uL   RBC 3.82 (*) 3.87 - 5.11 MIL/uL   Hemoglobin 11.9 (*) 12.0 - 15.0 g/dL   HCT 16.1 (*) 09.6 - 04.5 %   MCV 90.3  78.0 - 100.0 fL   MCH 31.2  26.0 - 34.0 pg   MCHC 34.5  30.0 - 36.0 g/dL   RDW 40.9  81.1 - 91.4 %   Platelets 214  150 - 400 K/uL          Meds: Dilaudid 2 mg IV q 2 hours prn--just received 3rd dose since admission  A: [redacted]w[redacted]d with bilateral kidney stones, with chronic hx     Pain not yet under control  P: Consulted with Dr. Estanislado Pandy and pharmacist, Victorino Dike     Plan Dilaudid PCA--if patient has persistent itching, may change to Fentanyl PCA     Will contact Dr. Lenoria Chime office to inform of admission and request consult regarding management.     Repeat renal US with full bladder (per radiologist recommendation), when pain under better under control today.     Needs Valtrex 500 mg po daily for suppression--will Rx.     Needs GBS done--will do later today.     MDs will follow  Shaia Porath,  Alanzo Lamb CNM, [place] 07/08/2012 8:00 AM  Addendum:  Contacted Dr. Dahlstedt's office (spoke with Marchelle Folks at [phone]) to inform of patient's admission and request consult visit from urologist during patient's hospitalization.  Dollie advised she would inform the MD and coordinate consult visit.  Nigel Bridgeman, CNM 07/08/12 8:15a

## 2012-07-08 NOTE — Progress Notes (Signed)
Pt crying in br due to pain and unable to urinate due to pain  CNM notified orders rec'd

## 2012-07-08 NOTE — Progress Notes (Addendum)
Still very uncomfortable--Dilaudid PCA infusing, but there was leak in line.  Has been replaced by nurse, but I am not sure how much she has really received since initiation of PCA. [place] tech at bedside performing f/u renal evaluation. Dr. Retta Diones has seen patient--ordered IV Tylenol 1000 mg and Flomax. Anticipates observation at present to allow for passing of the stone(s), unless pain is unable to be controlled.  Will give supplemental dose of Fentanyl in an attempt to get patient more comfortable--if changing Dilaudid tubing, IV Tylenol infusion, and Fentanyl dose do not increase patient's comfort, will notify Dr. Estanislado Pandy for additional orders.  Nigel Bridgeman, CNM 11:20a  Pt seen Plan of care reviewed Will follow jointly with Dr Lynnae Sandhoff

## 2012-07-08 NOTE — H&P (Signed)
[name] Bass is a 26 y.o. white female presenting unannounced for severe, acute Lt lower back, suprapubic, and LLQ pain, which awoke her out of sleep.  Pt describes pain as being exact in nature when she had kidney stone earlier in this pregnancy.  Denies dysuria, but reports urinary frequency.  Denies fever or recent illness.  Reports nausea this AM, but no vomiting or other GI c/o's. No resp c/o's.  No VB or LOF; does not discern any ctxs.  GFM reported.  Accompanied by her husband and 2 other children.  No changes in diet; reports good hydration. Prenatal Course: Patient entered care at 10 weeks. EDC of 08/09/12 was established by 1st trimester [place] ([redacted]w[redacted]d), due to unsure LMP. Anatomy scan was done at 18 weeks, with normal findings. Normal Quad screen. She was diagnosed with a UTI from a MAU visit on 03/17/12, with Macrobid and Flagyl Rx'd from that visit. Urine culture and GC/Chlamydia were negative from that visit.  Admitted to Antenatal 03/23/12-03/29/12 for Rt uretal stone, and had cystoscopy w/ stone extraction by Dr. Retta Diones on 03/28/12. Pt was treated w/ Flomax during hospitalization prior to stone removal.  She was d/c'd home on percocet for pain only.  Pt evaluated for syncopal episode on 05/08/12, and w/u revealed probably related to hypoglycemia (only eaten a pop tart on that day).  Pt signed 30-d tubal papers at [facility] on 05/09/12.  She had an elevated 1hr gtt=152, but normal 3hr gtt (91,157, 142, 129).  Pt reports f/u w/ urology since previous stone incident, last seeing Dr. Retta Diones "about a month ago."   .Marland Kitchen Patient Active Problem List  Diagnosis  . Lower abdominal pain  . High risk HPV infection  . History of PCOS  . Cervical intraepithelial neoplasia I  . HSV (herpes simplex virus) anogenital infection  . Abnormal Pap smear  . Polycystic ovarian syndrome  . CIN I (cervical intraepithelial neoplasia I)  . Herpes simplex without mention of complication  . FHx: suicide  . Normal  pregnancy  . Kidney stone on right side  . Flu vaccine need  . Bilateral kidney stones  . Abnormal glucose tolerance test in pregnancy, antepartum  . Unsure of LMP      History OB History    Grav Para Term Preterm Abortions TAB SAB Ect Mult Living   3 2 2       2      Past Medical History  Diagnosis Date  . Abnormal Pap smear   . Polycystic ovarian syndrome 05/08/05  . 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 CIN I (cervical intraepithelial neoplasia I)   . Herpes simplex without mention of complication   . FHx: suicide   . H/O amenorrhea 02/19/04  . Vaginitis and vulvovaginitis 04/29/04  . Bacterial vaginosis 04/29/04  . Dysuria 04/04/05  . Irregular periods/menstrual cycles   . Hx: UTI (urinary tract infection)   . Dyspareunia 05/02/10  . Herpes simplex type 2 infection     "last outbreak 3 weeks ago"  . Chronic kidney disease    Past Surgical History  Procedure Date  . Cholecystectomy, laparoscopic 2010  . Wisdom tooth extraction 2001  . Tonsillectomy 2006  . Cystoscopy with ureteroscopy 03/28/2012    Procedure: CYSTOSCOPY WITH URETEROSCOPY;  Surgeon: Marcine Matar, MD;  Location: [facility];  Service: Urology;  Laterality: Right;  RIGHT URETEROSCOPIC STONE EXTRACTION W/ LASER (Pt is [redacted] wk pregnant)    . Stone extraction with basket 03/28/2012    Procedure: STONE  EXTRACTION WITH BASKET;  Surgeon: Marcine Matar, MD;  Location: [facility];  Service: Urology;  Laterality: Right;   Family History: family history includes Cancer in her maternal aunt, maternal grandmother, and maternal uncle; Diabetes in her maternal aunt and maternal uncle; Heart disease in her father; Hypertension in her paternal uncle; and Suicidality in her mother. Social History:  reports that she has never smoked. She has never used smokeless tobacco. She reports that she does not drink alcohol or use illicit drugs.   Prenatal Transfer Tool  Maternal Diabetes: No Genetic Screening: Normal Maternal  Ultrasounds/Referrals: Normal Fetal Ultrasounds or other Referrals:  None Maternal Substance Abuse:  No Significant Maternal Medications:  None Significant Maternal Lab Results:  Lab values include: Other:  Other Comments:  h/o HSV  Review of Systems  Constitutional: Negative.   HENT: Negative.   Eyes: Negative.   Respiratory: Negative.   Cardiovascular: Negative.   Gastrointestinal: Negative.   Genitourinary: Positive for frequency.  Skin: Negative.   Neurological: Negative.     Dilation: Fingertip Effacement (%): Thick Exam by:: H Kimberley Dastrup CNM Blood pressure 131/87, pulse 111, temperature 97.9 F (36.6 C), temperature source Oral, resp. rate 22, height 5\' 10"  (1.778 m), weight 223 lb 3.2 oz (101.243 kg), last menstrual period 10/06/2011. Exam Physical Exam  Constitutional: She is oriented to person, place, and time. She appears well-developed and well-nourished. She appears distressed.       Writhing/rocking in bed and crying; guarding LLQ  HENT:  Head: Normocephalic and atraumatic.  Eyes: Pupils are equal, round, and reactive to light.  Cardiovascular: Normal rate.   Respiratory: Effort normal.       Positive CVAT on Lt; not on Rt  GI: Soft. There is tenderness. There is guarding.       gravid  Neurological: She is alert and oriented to person, place, and time. She has normal reflexes.  Skin: Skin is warm and dry.       Tattoos; sweating, flush  Psychiatric: She has a normal mood and affect. Her behavior is normal. Judgment and thought content normal.    Prenatal labs: ABO, Rh: A/POS/-- (05/29 1714) Antibody: NEG (05/29 1714) Rubella: 115.2 (05/29 1714) RPR: NON REAC (09/19 1042)  HBsAg: NEGATIVE (05/29 1714)  HIV: NON REACTIVE (05/29 1714)  GBS:   not done  RADIOLOGY REPORT*  Clinical Data: Left flank pain, kidney stone history.  RENAL/URINARY TRACT ULTRASOUND COMPLETE  Comparison: 03/28/2011 CT  Findings:  Right Kidney: Mild hydronephrosis. Nonobstructing 4  mm interpolar  stone. Normal echogenicity. The kidney measures 15 cm in length.  Left Kidney: Mild to moderate hydronephrosis. Multiple tiny  nonobstructing stones, measuring up to 6 mm. The kidney measures  15.1 cm in length.  Bladder: Decompressed. This limits evaluation.  IMPRESSION:  Bilateral hydronephrosis, more pronounced on the left. There are  bilateral nonobstructing renal stones. A left ureteral stone is  not excluded.  The bladder was decompressed at the time of examination, limiting  evaluation for a UVJ stone and for ureteral jets. Consider  reevaluation with a distended bladder.  Original Report Authenticated By: Jearld Lesch, M.D. LABS: .Marland Kitchen Results for orders placed during the hospital encounter of 07/08/12 (from the past 24 hour(s))  URINALYSIS, ROUTINE W REFLEX MICROSCOPIC     Status: Abnormal   Collection Time   07/08/12  3:05 AM      Component Value Range   Color, Urine YELLOW  YELLOW   APPearance CLEAR  CLEAR   Specific Gravity, Urine 1.010  1.005 - 1.030   pH 6.5  5.0 - 8.0   Glucose, UA NEGATIVE  NEGATIVE mg/dL   Hgb urine dipstick LARGE (*) NEGATIVE   Bilirubin Urine NEGATIVE  NEGATIVE   Ketones, ur NEGATIVE  NEGATIVE mg/dL   Protein, ur NEGATIVE  NEGATIVE mg/dL   Urobilinogen, UA 0.2  0.0 - 1.0 mg/dL   Nitrite NEGATIVE  NEGATIVE   Leukocytes, UA NEGATIVE  NEGATIVE  URINE MICROSCOPIC-ADD ON     Status: Abnormal   Collection Time   07/08/12  3:05 AM      Component Value Range   Squamous Epithelial / LPF FEW (*) RARE   WBC, UA 0-2  <3 WBC/hpf   RBC / HPF 21-50  <3 RBC/hpf   Bacteria, UA MANY (*) RARE  CBC     Status: Abnormal   Collection Time   07/08/12  3:37 AM      Component Value Range   WBC 13.0 (*) 4.0 - 10.5 K/uL   RBC 3.82 (*) 3.87 - 5.11 MIL/uL   Hemoglobin 11.9 (*) 12.0 - 15.0 g/dL   HCT 16.1 (*) 09.6 - 04.5 %   MCV 90.3  78.0 - 100.0 fL   MCH 31.2  26.0 - 34.0 pg   MCHC 34.5  30.0 - 36.0 g/dL   RDW 40.9  81.1 - 91.4 %   Platelets  214  150 - 400 K/uL   Assessment/Plan: 1. [redacted]w[redacted]d 2. Severe low back, suprapubic, and LLQ pain 3.  Bilateral kidney stones on initial renal u/s 4.  H/o rt kidney stone w/ removal at 21 weeks 5. Cat I FHT 6. No s/s of PTL 7. H/o HSV  1. Admit to Antenatal w/ Dr. Normand Sloop as attending 2.  Routine antepartum orders; Per c/w Dr. Normand Sloop: LR at 163ml/hr, strain all urine, urine for cx; renal u/s; NST q shift; regular diet.   3. Dilaudid IV q2hr prn pain 4.  Urology consult prn; MD to follow   Marjorie Lussier H 07/08/2012, 3:39 AM

## 2012-07-08 NOTE — Progress Notes (Signed)
pca tubing leaking changed out portable US for kidney done at bedside due to pt being extremely uncomfortable

## 2012-07-08 NOTE — Consult Note (Signed)
Urology Consult   Physician requesting consult: Dillard  Reason for consult: Kidney stone  History of Present Illness: [name] Bass is a 26 y.o. female with a history of urolithiasis, treated during her second trimester, this July with a right sided ureteroscopic stone extraction. I saw her in followup about a month ago. At that time, she was doing well and was asymptomatic. She began having abdominal pain about a week ago which was intermittent and fairly mild. She started having urinary frequency and urgency 2-3 days ago. She was not having any fever, chills, gross hematuria. She began having significant left flank pain with nausea this morning at approximately 2 AM and came to maternity admissions. It was felt that she had a kidney stone. Renal ultrasound revealed mild right hydronephrosis with moderate hydronephrosis on the left and bilateral kidney stones. A distal stone was not seen. She was admitted for urologic consultation and pain management.  She denies a history of voiding or storage urinary symptoms, hematuria, UTIs, STDs, urolithiasis, GU malignancy/trauma/surgery.  Past Medical History  Diagnosis Date  . Abnormal Pap smear   . Polycystic ovarian syndrome 05/08/05  . 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 CIN I (cervical intraepithelial neoplasia I)   . Herpes simplex without mention of complication   . FHx: suicide   . H/O amenorrhea 02/19/04  . Vaginitis and vulvovaginitis 04/29/04  . Bacterial vaginosis 04/29/04  . Dysuria 04/04/05  . Irregular periods/menstrual cycles   . Hx: UTI (urinary tract infection)   . Dyspareunia 05/02/10  . Herpes simplex type 2 infection     "last outbreak 3 weeks ago"  . Chronic kidney disease     Past Surgical History  Procedure Date  . Cholecystectomy, laparoscopic 2010  . Wisdom tooth extraction 2001  . Tonsillectomy 2006  . Cystoscopy with ureteroscopy 03/28/2012    Procedure: CYSTOSCOPY WITH URETEROSCOPY;  Surgeon: Marcine Matar, MD;  Location: [facility];  Service: Urology;  Laterality: Right;  RIGHT URETEROSCOPIC STONE EXTRACTION W/ LASER (Pt is [redacted] wk pregnant)    . Stone extraction with basket 03/28/2012    Procedure: STONE EXTRACTION WITH BASKET;  Surgeon: Marcine Matar, MD;  Location: [facility];  Service: Urology;  Laterality: Right;     Current Hospital Medications: Scheduled Meds:   . [COMPLETED] diphenhydrAMINE  25 mg Intravenous Once  . docusate sodium  100 mg Oral Daily  . [COMPLETED]  HYDROmorphone (DILAUDID) injection  2 mg Intravenous Once  . HYDROmorphone PCA 0.3 mg/mL   Intravenous Q4H  . prenatal multivitamin  1 tablet Oral Daily  . valACYclovir  500 mg Oral Daily   Continuous Infusions:   . lactated ringers 150 mL/hr at 07/08/12 0912   PRN Meds:.acetaminophen, calcium carbonate, diphenhydrAMINE, diphenhydrAMINE, HYDROmorphone, naloxone, ondansetron (ZOFRAN) IV, sodium chloride, zolpidem  Allergies:  Allergies  Allergen Reactions  . Amoxicillin Rash    Family History  Problem Relation Age of Onset  . Heart disease Father     heart attack in oct.  lm  . Cancer Maternal Uncle     bone cancer  . Diabetes Maternal Uncle   . Cancer Maternal Grandmother     ovarian cancer  . Suicidality Mother   . Diabetes Maternal Aunt   . Cancer Maternal Aunt     lung  . Hypertension Paternal Uncle     Social History:  reports that she has never smoked. She has never used smokeless tobacco. She reports that she does not drink alcohol or use illicit  drugs.  ROS: A complete review of systems was performed.  All systems are negative except for pertinent findings as noted.  Physical Exam:  Vital signs in last 24 hours: Temp:  [97.9 F (36.6 C)] 97.9 F (36.6 C) (11/18 0753) Pulse Rate:  [87-111] 87  (11/18 0933) Resp:  [18-24] 18  (11/18 0933) BP: (131-141)/(64-87) 141/77 mmHg (11/18 0933) SpO2:  [93 %-94 %] 93 % (11/18 0933) Weight:  [101.243 kg (223 lb 3.2 oz)-101.288 kg (223 lb 4.8  oz)] 101.288 kg (223 lb 4.8 oz) (11/18 0533) General:  Alert and oriented, in mild distress HEENT: Normocephalic, atraumatic Neck: No JVD or lymphadenopathy Cardiovascular: Regular rate and rhythm Lungs: Clear bilaterally Abdomen: Gravid, with left CVA and lower quadrant tenderness, mild to moderate. No rebound or guarding was noted. Extremities: No edema Neurologic: Grossly intact  Laboratory Data:   [facility] 07/08/12 0337  WBC 13.0*  HGB 11.9*  HCT 34.5*  PLT 214    No results found for this basename: NA:5,K:5,CL:5,CO3:5,GLUCOSE:5,BUN:5,CALCIUM:5,CREATININE:5 in the last 72 hours   Results for orders placed during the hospital encounter of 07/08/12 (from the past 24 hour(s))  URINALYSIS, ROUTINE W REFLEX MICROSCOPIC     Status: Abnormal   Collection Time   07/08/12  3:05 AM      Component Value Range   Color, Urine YELLOW  YELLOW   APPearance CLEAR  CLEAR   Specific Gravity, Urine 1.010  1.005 - 1.030   pH 6.5  5.0 - 8.0   Glucose, UA NEGATIVE  NEGATIVE mg/dL   Hgb urine dipstick LARGE (*) NEGATIVE   Bilirubin Urine NEGATIVE  NEGATIVE   Ketones, ur NEGATIVE  NEGATIVE mg/dL   Protein, ur NEGATIVE  NEGATIVE mg/dL   Urobilinogen, UA 0.2  0.0 - 1.0 mg/dL   Nitrite NEGATIVE  NEGATIVE   Leukocytes, UA NEGATIVE  NEGATIVE  URINE MICROSCOPIC-ADD ON     Status: Abnormal   Collection Time   07/08/12  3:05 AM      Component Value Range   Squamous Epithelial / LPF FEW (*) RARE   WBC, UA 0-2  <3 WBC/hpf   RBC / HPF 21-50  <3 RBC/hpf   Bacteria, UA MANY (*) RARE  CBC     Status: Abnormal   Collection Time   07/08/12  3:37 AM      Component Value Range   WBC 13.0 (*) 4.0 - 10.5 K/uL   RBC 3.82 (*) 3.87 - 5.11 MIL/uL   Hemoglobin 11.9 (*) 12.0 - 15.0 g/dL   HCT 08.6 (*) 57.8 - 46.9 %   MCV 90.3  78.0 - 100.0 fL   MCH 31.2  26.0 - 34.0 pg   MCHC 34.5  30.0 - 36.0 g/dL   RDW 62.9  52.8 - 41.3 %   Platelets 214  150 - 400 K/uL   No results found for this or any previous  visit (from the past 240 hour(s)).  Renal Function: No results found for this basename: CREATININE:7 in the last 168 hours Estimated Creatinine Clearance: 136.1 ml/min (by C-G formula based on Cr of 0.46).  Radiologic Imaging: US Renal  07/08/2012  *RADIOLOGY REPORT*  Clinical Data: Left flank pain, kidney stone history.  RENAL/URINARY TRACT ULTRASOUND COMPLETE  Comparison:  03/28/2011 CT  Findings:  Right Kidney:  Mild hydronephrosis.  Nonobstructing 4 mm interpolar stone.  Normal echogenicity. The kidney measures 15 cm in length.  Left Kidney:  Mild to moderate hydronephrosis.  Multiple tiny nonobstructing stones, measuring up to  6 mm.  The kidney measures 15.1 cm in length.  Bladder:  Decompressed.  This limits evaluation.  IMPRESSION: Bilateral hydronephrosis, more pronounced on the left.  There are bilateral nonobstructing renal stones.  A left ureteral stone is not excluded.  The bladder was decompressed at the time of examination, limiting evaluation for a UVJ stone and for ureteral jets. Consider reevaluation with a distended bladder.   Original Report Authenticated By: Jearld Lesch, M.D.     I independently reviewed the above imaging studies.  Impression/Assessment:  1. Probable left distal ureteral stone, based on the patient's left hydronephrosis on ultrasound and lower urinary tract symptomatology. She also has bilateral renal calculi that are currently asymptomatic  2. Bacteriuria-probably spurious in that this is most likely from external contamination.  Plan:  1. I think it worthwhile to maximize her medical therapy with alpha blockers and IV Tylenol as well as her narcotics  2. I will order culture of her urine  3. I would suggest at this time that we wait a bit, check her pending ultrasound study of the distal ureters. Hopefully, if this is a smaller stone, she can be treated with medical expulsive therapy, and made comfortable. I would like to give her at least another  couple of days to pass a stone. If she is still uncomfortable at that time, or has fever before then, I would then recommend ureteroscopic stone extraction. She agrees with this plan.

## 2012-07-09 DIAGNOSIS — O99810 Abnormal glucose complicating pregnancy: Secondary | ICD-10-CM

## 2012-07-09 DIAGNOSIS — N200 Calculus of kidney: Secondary | ICD-10-CM

## 2012-07-09 LAB — URINE CULTURE

## 2012-07-09 MED ORDER — HYDROMORPHONE HCL 2 MG PO TABS: 2.0000 mg | ORAL_TABLET | ORAL | Status: DC | PRN

## 2012-07-09 MED ORDER — TAMSULOSIN HCL 0.4 MG PO CAPS: 0.4000 mg | ORAL_CAPSULE | Freq: Every day | ORAL | Status: DC

## 2012-07-09 MED ORDER — CYCLOBENZAPRINE HCL 10 MG PO TABS
10.0000 mg | ORAL_TABLET | Freq: Three times a day (TID) | ORAL | Status: DC | PRN
  Administered 2012-07-09: 10 mg via ORAL
  Filled 2012-07-09: qty 1

## 2012-07-09 MED ORDER — HYDROMORPHONE HCL 2 MG PO TABS
2.0000 mg | ORAL_TABLET | ORAL | Status: DC | PRN
  Administered 2012-07-09: 2 mg via ORAL
  Filled 2012-07-09: qty 1

## 2012-07-09 NOTE — Discharge Summary (Signed)
Physician Discharge Summary  Patient ID: [name] Bass MRN: [id number] DOB/AGE: [birth year] 27 y.o.  Admit date: 07/08/2012 Discharge date: 07/09/2012  Admission Diagnoses:  Left flank pain  Discharge Diagnoses:  Principal Problem:  *Bilateral kidney stones   Discharged Condition: stable  Hospital Course: Admitted on 07/09/12 with severe left flank pain--known hx of kidney stones.  US showed bilateral non-obstructing renal calculi on 2 scans.  Dr. Retta Diones was consulted as patient's urologist, and he placed patient on Flomax daily.  Patient's pain was eventually well-controlled by Dilaudid PCA and IV Tylenol 1000 mg q 6 hours x 24 hours.  Urine culture was negative, and GBS was also negative during hospital stay.  On 11/19, patient passed some material in urine that might have been stone or other tissue, but from that point, pain was much improved.  Dr. Stefano Gaul saw the patient on 11/19, and per Dr. Lenoria Chime note, the patient was able to be d/c'd home on po pain med and Flomax.  She was to continue to strain all urine, take Dilaudid po (Percocet had previously given patient nightmares), continue Flomax daily, and f/u with [facility] and urologist prn. Dr. Estanislado Pandy did broach the subject of possible induction prior to 40 weeks with the patient--the patient is interested in this option.  This discussion will be referred to an upcoming office visit.  She was discharged home on the evening on 07/09/12, to be out of work this week until 07/15/12.  Consults: urology, Dr. Retta Diones  Significant Diagnostic Studies: labs:  Results for orders placed during the hospital encounter of 07/08/12 (from the past 48 hour(s))  URINALYSIS, ROUTINE W REFLEX MICROSCOPIC     Status: Abnormal   Collection Time   07/08/12  3:05 AM      Component Value Range Comment   Color, Urine YELLOW  YELLOW    APPearance CLEAR  CLEAR    Specific Gravity, Urine 1.010  1.005 - 1.030    pH 6.5  5.0 - 8.0    Glucose, UA  NEGATIVE  NEGATIVE mg/dL    Hgb urine dipstick LARGE (*) NEGATIVE    Bilirubin Urine NEGATIVE  NEGATIVE    Ketones, ur NEGATIVE  NEGATIVE mg/dL    Protein, ur NEGATIVE  NEGATIVE mg/dL    Urobilinogen, UA 0.2  0.0 - 1.0 mg/dL    Nitrite NEGATIVE  NEGATIVE    Leukocytes, UA NEGATIVE  NEGATIVE   URINE MICROSCOPIC-ADD ON     Status: Abnormal   Collection Time   07/08/12  3:05 AM      Component Value Range Comment   Squamous Epithelial / LPF FEW (*) RARE    WBC, UA 0-2  <3 WBC/hpf    RBC / HPF 21-50  <3 RBC/hpf    Bacteria, UA MANY (*) RARE   URINE CULTURE     Status: Normal   Collection Time   07/08/12  3:05 AM      Component Value Range Comment   Specimen Description URINE, RANDOM      Special Requests NONE      Culture  Setup Time 07/08/2012 14:18      Colony Count 8,000 COLONIES/ML      Culture INSIGNIFICANT GROWTH      Report Status 07/09/2012 FINAL     CBC     Status: Abnormal   Collection Time   07/08/12  3:37 AM      Component Value Range Comment   WBC 13.0 (*) 4.0 - 10.5 K/uL    RBC  3.82 (*) 3.87 - 5.11 MIL/uL    Hemoglobin 11.9 (*) 12.0 - 15.0 g/dL    HCT 16.1 (*) 09.6 - 46.0 %    MCV 90.3  78.0 - 100.0 fL    MCH 31.2  26.0 - 34.0 pg    MCHC 34.5  30.0 - 36.0 g/dL    RDW 04.5  40.9 - 81.1 %    Platelets 214  150 - 400 K/uL    Radiology: Ultrasound: Bilateral renal calculi  Treatments: IV hydration, analgesia: acetaminophen and Dilaudid PCA and Flomax  Discharge Exam: Blood pressure 120/76, pulse 111, temperature 98.9 F (37.2 C), temperature source Axillary, resp. rate 17, height 5\' 10"  (1.778 m), weight 223 lb 4.8 oz (101.288 kg), last menstrual period 10/06/2011, SpO2 97.00%. General appearance: alert Back: symmetric, no curvature. ROM normal. No CVA tenderness. Resp: clear to auscultation bilaterally Cardio: regular rate and rhythm, S1, S2 normal, no murmur, click, rub or gallop GI: soft, non-tender; bowel sounds normal; no masses,  no organomegaly Pelvic:  Deferred on d/c--cervix closed on admission. Extremities: extremities normal, atraumatic, no cyanosis or edema Uterus--FH approx 35 week size, NT FHR reactive on TID NST UCs none  Disposition: 01-Home or Self Care  Discharge Orders    Future Appointments: Provider: Department: Dept Phone: Center:   07/16/2012 4:30 PM Hal Morales, MD [facility] [phone] None       Medication List     As of 07/09/2012  5:59 PM    TAKE these medications         HYDROmorphone 2 MG tablet   Commonly known as: DILAUDID   Take 1-2 tablets (2-4 mg total) by mouth every 4 (four) hours as needed.      pantoprazole 20 MG tablet   Commonly known as: PROTONIX   Take 20 mg by mouth daily.      prenatal multivitamin Tabs   Take 1 tablet by mouth daily.      Tamsulosin HCl 0.4 MG Caps   Commonly known as: FLOMAX   Take 1 capsule (0.4 mg total) by mouth daily.           Follow-up Information    Follow up with [facility]. On 07/16/2012. (Call with any questions, concerns, or worsening of symptoms.)    Contact information:   [address]  [address] [place] [address] [phone]         Signed: Nigel Bridgeman 07/09/2012, 5:59 PM

## 2012-07-09 NOTE — Progress Notes (Signed)
Patient discharged home with significant other. Patient verbalizes understanding with follow-up care and discharge instructions.

## 2012-07-09 NOTE — Progress Notes (Signed)
This note also relates to the following rows which could not be included:  Rate - Cannot attach notes to extension rows

## 2012-07-09 NOTE — Progress Notes (Signed)
Subjective: Patient reports that she is feeling much better. She was in significant pain in the afternoon yesterday. She was also having urgency. Both of those have resolved.  Objective: Vital signs in last 24 hours: Temp:  [97.9 F (36.6 C)-98.4 F (36.9 C)] 98.1 F (36.7 C) (11/19 0202) Pulse Rate:  [80-100] 100  (11/18 2025) Resp:  [18-24] 20  (11/19 0604) BP: (115-152)/(49-88) 127/70 mmHg (11/18 2025) SpO2:  [79 %-100 %] 97 % (11/19 0604)  Intake/Output from previous day: 11/18 0701 - 11/19 0700 In: 4460 [P.O.:240; I.V.:3920; IV Piggyback:300] Out: 2250 [Urine:2250] Intake/Output this shift:    Physical Exam:  Constitutional: Vital signs reviewed. WD WN in NAD   Eyes: PERRL, No scleral icterus.   Cardiovascular: RRR Pulmonary/Chest: Normal effort Abdominal: Soft. She has much less less CVA tenderness. Abdomen is gravid.   Lab Results:  [facility] 07/08/12 0337  HGB 11.9*  HCT 34.5*   BMET No results found for this basename: NA:2,K:2,CL:2,CO2:2,GLUCOSE:2,BUN:2,CREATININE:2,CALCIUM:2 in the last 72 hours No results found for this basename: LABPT:3,INR:3 in the last 72 hours No results found for this basename: LABURIN:1 in the last 72 hours Results for orders placed during the hospital encounter of 03/23/12  URINE CULTURE     Status: Normal   Collection Time   03/23/12  2:38 PM      Component Value Range Status Comment   Specimen Description URINE, CLEAN CATCH   Final    Special Requests NONE   Final    Culture  Setup Time 03/23/2012 17:09   Final    Colony Count NO GROWTH   Final    Culture NO GROWTH   Final    Report Status 03/24/2012 FINAL   Final   CULTURE, BETA STREP (GROUP B ONLY)     Status: Normal   Collection Time   03/23/12  3:00 PM      Component Value Range Status Comment   Specimen Description VAGINAL/RECTAL   Final    Special Requests NONE   Final    Culture NO GROUP B STREP (S.AGALACTIAE) ISOLATED   Final    Report Status 03/26/2012 FINAL   Final    WET PREP, GENITAL     Status: Abnormal   Collection Time   03/23/12  3:00 PM      Component Value Range Status Comment   Yeast Wet Prep HPF POC NONE SEEN  NONE SEEN Final    Trich, Wet Prep NONE SEEN  NONE SEEN Final    Clue Cells Wet Prep HPF POC NONE SEEN  NONE SEEN Final    WBC, Wet Prep HPF POC FEW (*) NONE SEEN Final BACTERIA- TOO NUMEROUS TO COUNT  SURGICAL PCR SCREEN     Status: Abnormal   Collection Time   03/27/12  9:30 PM      Component Value Range Status Comment   MRSA, PCR NEGATIVE  NEGATIVE Final    Staphylococcus aureus POSITIVE (*) NEGATIVE Final     Studies/Results: US Renal  07/08/2012  *RADIOLOGY REPORT*  Clinical Data: Bilateral flank pain.  [redacted] weeks pregnant.  Possible renal calculi on prior ultrasound.  RENAL/URINARY TRACT ULTRASOUND COMPLETE  Comparison:  07/08/2012  Findings:  Right Kidney:  Measures 14.1 cm in length.  Normal parenchymal echogenicity.  No evidence of renal mass or abscess.  Mild pelvicaliectasis is demonstrated, without significant change.  No definite intrarenal calculi visualized.  Left Kidney:  Measures 15.6 cm in length.  Normal parenchymal echogenicity.  No evidence of renal mass  or abscess.  Mild pelvicaliectasis is demonstrated, without significant change.  No definite intrarenal calculi visualized.  Bladder:  Unremarkable in appearance for degree of bladder filling. Intrauterine fetus seen in cephalic presentation.  IMPRESSION:  Bilateral renal pelvicaliectasis, without significant change. Etiology is not visualized by ultrasound.  This may represent physiologic hydronephrosis of pregnancy, although an occult ureteral calculus cannot definitely be excluded by ultrasound.  If clinically warranted, abdomen pelvis CT or MRI may be appropriate for further evaluation.   Original Report Authenticated By: Myles Rosenthal, M.D.    US Renal  07/08/2012  *RADIOLOGY REPORT*  Clinical Data: Left flank pain, kidney stone history.  RENAL/URINARY TRACT ULTRASOUND  COMPLETE  Comparison:  03/28/2011 CT  Findings:  Right Kidney:  Mild hydronephrosis.  Nonobstructing 4 mm interpolar stone.  Normal echogenicity. The kidney measures 15 cm in length.  Left Kidney:  Mild to moderate hydronephrosis.  Multiple tiny nonobstructing stones, measuring up to 6 mm.  The kidney measures 15.1 cm in length.  Bladder:  Decompressed.  This limits evaluation.  IMPRESSION: Bilateral hydronephrosis, more pronounced on the left.  There are bilateral nonobstructing renal stones.  A left ureteral stone is not excluded.  The bladder was decompressed at the time of examination, limiting evaluation for a UVJ stone and for ureteral jets. Consider reevaluation with a distended bladder.   Original Report Authenticated By: Jearld Lesch, M.D.     Assessment/Plan:   Hospital day #2 for possible left distal ureteral stone. She is much more comfortable this morning-she may have passed a small stone.    I would recommend letting her go home later today if she is comfortable, on a narcotic as well as tamsulosin 0.4 mg daily for a week. If she persists with pain, I would recommend a noncontrasted CT of the abdomen and pelvis. If she remains pain-free, home and followup with me.   LOS: 1 day   [name] Bass 07/09/2012, 7:39 AM

## 2012-07-09 NOTE — Progress Notes (Signed)
Passed tiny stone with lots of sludge.  Specimen labeled and left at bedside.

## 2012-07-09 NOTE — Progress Notes (Signed)
PROGRESS NOTE  I have reviewed the patient's vital signs, labs, and notes. I have examined the patient. I agree with the previous note from the Certified Nurse Midwife.  If imaging is appropriate, then I would recommend an MRI over a CT scan.  Leonard Schwartz, M.D.

## 2012-07-09 NOTE — Progress Notes (Signed)
Hospital day # 1 pregnancy at [redacted]w[redacted]d--bilateral kidney stones.  S:  Feels better today--may have passed small stone during night.  Reports Dilaudid PCA helps with pain.  Very little rest during night.  Denies N/V or contractions.  Had episode of urinary urgency yesterday, improved today.  O: BP 127/70  Pulse 100  Temp 98.1 F (36.7 C) (Oral)  Resp 20  Ht 5\' 10"  (1.778 m)  Wt 223 lb 4.8 oz (101.288 kg)  BMI 32.04 kg/m2  SpO2 97%  LMP 10/06/2011      Fetal tracings:  Reactive on q shift NST      Contractions: Occasional, mild.        Uterus gravid and non-tender      Extremities: no significant edema and no signs of DVT          Labs:  Urine culture and GBS pending from 11/18.       Meds: Dilaudid PCA, with fentanyl IV as adjunct prn (last used fentanyl at 4:50pm); Flomax; Valtrex for suppression       Seen by Dr. Retta Diones this am--see note.    A: [redacted]w[redacted]d with bilateral kidney stones, ? Passage of small stone during night     Stable, with improved pain on Dilaudid PCA  P: Continue current plan of care      Dr. Stefano Gaul will see and determine plan of care.   Nigel Bridgeman CNM, [place] 07/09/2012 8:14 AM

## 2012-07-11 LAB — CULTURE, BETA STREP (GROUP B ONLY)

## 2012-07-16 ENCOUNTER — Encounter: Payer: Medicaid Other | Admitting: Obstetrics and Gynecology

## 2012-07-16 ENCOUNTER — Encounter: Payer: Self-pay | Admitting: Obstetrics and Gynecology

## 2012-07-16 ENCOUNTER — Ambulatory Visit (INDEPENDENT_AMBULATORY_CARE_PROVIDER_SITE_OTHER): Payer: Medicaid Other | Admitting: Obstetrics and Gynecology

## 2012-07-16 VITALS — BP 122/68 | Wt 224.0 lb

## 2012-07-16 DIAGNOSIS — Z3490 Encounter for supervision of normal pregnancy, unspecified, unspecified trimester: Secondary | ICD-10-CM

## 2012-07-16 DIAGNOSIS — Z331 Pregnant state, incidental: Secondary | ICD-10-CM

## 2012-07-16 NOTE — Progress Notes (Signed)
Hospitalized last week for bilateral kidney stones--straining urine, still using Dilaudid for pain. Still has flank pain, some days better--hopes to defer any urological intervention until after delivery. Dr. Estanislado Pandy had mentioned option of induction after 37 weeks--patient would want that to be considered. Will consult with Dr. Estanislado Pandy regarding the issue. Cervic 1 cm, 50%, vtx, -2. Will f/u with [place] if kidney stone pain worsens. Reviewed negative GBS

## 2012-07-16 NOTE — Progress Notes (Signed)
[redacted]w[redacted]d Pt states that she still doesn't believe kidney stones have passed. Still having back pain.  Request cervix check.

## 2012-07-19 NOTE — Progress Notes (Addendum)
Per consult with SR after patient's visit--as long as pain controlled, would not consider induction before 39 weeks, but would consider after that time. Will discuss with patient at [place]--she is seeing SL on 07/23/12, will be 37 4/7 weeks then.

## 2012-07-23 ENCOUNTER — Ambulatory Visit (INDEPENDENT_AMBULATORY_CARE_PROVIDER_SITE_OTHER): Payer: Medicaid Other | Admitting: Obstetrics and Gynecology

## 2012-07-23 VITALS — BP 118/74 | Wt 226.0 lb

## 2012-07-23 DIAGNOSIS — Z331 Pregnant state, incidental: Secondary | ICD-10-CM

## 2012-07-23 DIAGNOSIS — N200 Calculus of kidney: Secondary | ICD-10-CM

## 2012-07-23 MED ORDER — HYDROMORPHONE HCL 2 MG PO TABS: 2.0000 mg | ORAL_TABLET | ORAL | Status: DC | PRN

## 2012-07-23 MED ORDER — VALACYCLOVIR HCL 500 MG PO TABS: 500.0000 mg | ORAL_TABLET | Freq: Every day | ORAL | Status: DC

## 2012-07-23 NOTE — Progress Notes (Signed)
[redacted]w[redacted]d Pt inquiring about IOL, was under the impression could be done at 37wks rv'd w pt guidelines would not allow IOL before 39wks Also rv'd w Dr Estanislado Pandy who suggested MFM consult for their recommendation about induction before 39wks Pt has bilateral flank pain, taking dilaudid about twice daily, also taking flomax,  Pt declined MFM consult and agreed to induction at 39wks  rv'd [facility] and labor sx's

## 2012-07-23 NOTE — Progress Notes (Signed)
[redacted]w[redacted]d  Pt has no concerns today. Pt desires cervix check today.

## 2012-07-24 ENCOUNTER — Telehealth: Payer: Self-pay | Admitting: Obstetrics and Gynecology

## 2012-07-24 NOTE — Telephone Encounter (Signed)
Induction scheduled for 08/02/12 @ 7:30pm with [facility]. -Adrianne Pridgen

## 2012-07-25 ENCOUNTER — Telehealth: Payer: Self-pay | Admitting: Obstetrics and Gynecology

## 2012-07-25 ENCOUNTER — Encounter (HOSPITAL_COMMUNITY): Payer: Self-pay | Admitting: *Deleted

## 2012-07-25 ENCOUNTER — Encounter: Payer: Self-pay | Admitting: Obstetrics and Gynecology

## 2012-07-25 ENCOUNTER — Telehealth (HOSPITAL_COMMUNITY): Payer: Self-pay | Admitting: *Deleted

## 2012-07-25 NOTE — Telephone Encounter (Signed)
Induction rescheduled to 08/05/12 @ 7:30pm with SR/MK.  -Adrianne Pridgen

## 2012-07-25 NOTE — Telephone Encounter (Signed)
Preadmission screen  

## 2012-07-30 ENCOUNTER — Ambulatory Visit (INDEPENDENT_AMBULATORY_CARE_PROVIDER_SITE_OTHER): Payer: Medicaid Other | Admitting: Obstetrics and Gynecology

## 2012-07-30 ENCOUNTER — Encounter: Payer: Self-pay | Admitting: Obstetrics and Gynecology

## 2012-07-30 VITALS — BP 112/68 | Wt 223.0 lb

## 2012-07-30 DIAGNOSIS — Z331 Pregnant state, incidental: Secondary | ICD-10-CM

## 2012-07-30 NOTE — Progress Notes (Signed)
[redacted]w[redacted]d The patient has a history of kidney stones. The patient states that induction has been scheduled.  I will review with the physician on call.  Dr. Stefano Gaul

## 2012-07-30 NOTE — Progress Notes (Signed)
[redacted]w[redacted]d Pt request cervix check.

## 2012-08-02 ENCOUNTER — Inpatient Hospital Stay (HOSPITAL_COMMUNITY): Admission: RE | Admit: 2012-08-02 | Payer: Medicaid Other | Source: Ambulatory Visit

## 2012-08-03 ENCOUNTER — Encounter (HOSPITAL_COMMUNITY): Payer: Self-pay | Admitting: Anesthesiology

## 2012-08-03 ENCOUNTER — Inpatient Hospital Stay (HOSPITAL_COMMUNITY)
Admission: AD | Admit: 2012-08-03 | Discharge: 2012-08-05 | DRG: 767 | Disposition: A | Discharge status: Home / Self Care | Payer: Medicaid Other | Source: Ambulatory Visit | Attending: Obstetrics and Gynecology | Admitting: Obstetrics and Gynecology

## 2012-08-03 ENCOUNTER — Encounter (HOSPITAL_COMMUNITY): Payer: Self-pay | Admitting: Obstetrics and Gynecology

## 2012-08-03 ENCOUNTER — Inpatient Hospital Stay (HOSPITAL_COMMUNITY): Payer: Medicaid Other | Admitting: Anesthesiology

## 2012-08-03 DIAGNOSIS — IMO0002 Reserved for concepts with insufficient information to code with codable children: Secondary | ICD-10-CM | POA: Diagnosis present

## 2012-08-03 DIAGNOSIS — N870 Mild cervical dysplasia: Secondary | ICD-10-CM | POA: Diagnosis present

## 2012-08-03 DIAGNOSIS — IMO0001 Reserved for inherently not codable concepts without codable children: Secondary | ICD-10-CM

## 2012-08-03 DIAGNOSIS — E282 Polycystic ovarian syndrome: Secondary | ICD-10-CM | POA: Diagnosis present

## 2012-08-03 DIAGNOSIS — Z302 Encounter for sterilization: Secondary | ICD-10-CM | POA: Diagnosis not present

## 2012-08-03 DIAGNOSIS — O34599 Maternal care for other abnormalities of gravid uterus, unspecified trimester: Secondary | ICD-10-CM | POA: Diagnosis present

## 2012-08-03 DIAGNOSIS — O479 False labor, unspecified: Secondary | ICD-10-CM | POA: Diagnosis present

## 2012-08-03 DIAGNOSIS — Z23 Encounter for immunization: Secondary | ICD-10-CM

## 2012-08-03 DIAGNOSIS — N200 Calculus of kidney: Secondary | ICD-10-CM

## 2012-08-03 LAB — CBC
MCH: 31.5 pg (ref 26.0–34.0)
MCHC: 34.6 g/dL (ref 30.0–36.0)
MCV: 90.9 fL (ref 78.0–100.0)
Platelets: 220 10*3/uL (ref 150–400)
RBC: 4.19 MIL/uL (ref 3.87–5.11)

## 2012-08-03 MED ORDER — PRENATAL MULTIVITAMIN CH
1.0000 | ORAL_TABLET | Freq: Every day | ORAL | Status: DC
  Administered 2012-08-03 – 2012-08-04 (×2): 1 via ORAL
  Filled 2012-08-03 (×2): qty 1

## 2012-08-03 MED ORDER — EPHEDRINE 5 MG/ML INJ
INTRAVENOUS | Status: AC
  Filled 2012-08-03: qty 4

## 2012-08-03 MED ORDER — IBUPROFEN 600 MG PO TABS
600.0000 mg | ORAL_TABLET | Freq: Four times a day (QID) | ORAL | Status: DC
  Administered 2012-08-04 – 2012-08-05 (×6): 600 mg via ORAL
  Filled 2012-08-03 (×6): qty 1

## 2012-08-03 MED ORDER — LIDOCAINE HCL (PF) 1 % IJ SOLN
30.0000 mL | INTRAMUSCULAR | Status: DC | PRN
  Administered 2012-08-03: 30 mL via SUBCUTANEOUS
  Filled 2012-08-03: qty 30

## 2012-08-03 MED ORDER — ZOLPIDEM TARTRATE 5 MG PO TABS: 5.0000 mg | ORAL_TABLET | Freq: Every evening | ORAL | Status: DC | PRN

## 2012-08-03 MED ORDER — SIMETHICONE 80 MG PO CHEW: 80.0000 mg | CHEWABLE_TABLET | ORAL | Status: DC | PRN

## 2012-08-03 MED ORDER — LACTATED RINGERS IV SOLN
500.0000 mL | Freq: Once | INTRAVENOUS | Status: AC
  Administered 2012-08-03: 14:00:00 via INTRAVENOUS

## 2012-08-03 MED ORDER — TETANUS-DIPHTH-ACELL PERTUSSIS 5-2.5-18.5 LF-MCG/0.5 IM SUSP
0.5000 mL | Freq: Once | INTRAMUSCULAR | Status: AC
  Administered 2012-08-04: 0.5 mL via INTRAMUSCULAR
  Filled 2012-08-03: qty 0.5

## 2012-08-03 MED ORDER — NALBUPHINE SYRINGE 5 MG/0.5 ML: 5.0000 mg | INJECTION | INTRAMUSCULAR | Status: DC | PRN

## 2012-08-03 MED ORDER — FENTANYL 2.5 MCG/ML BUPIVACAINE 1/10 % EPIDURAL INFUSION (WH - ANES)
14.0000 mL/h | INTRAMUSCULAR | Status: DC
  Administered 2012-08-03: 14 mL/h via EPIDURAL

## 2012-08-03 MED ORDER — FLEET ENEMA 7-19 GM/118ML RE ENEM: 1.0000 | ENEMA | RECTAL | Status: DC | PRN

## 2012-08-03 MED ORDER — METOCLOPRAMIDE HCL 10 MG PO TABS
10.0000 mg | ORAL_TABLET | Freq: Once | ORAL | Status: AC
  Administered 2012-08-04: 10 mg via ORAL
  Filled 2012-08-03: qty 1

## 2012-08-03 MED ORDER — LACTATED RINGERS IV SOLN
INTRAVENOUS | Status: DC
  Administered 2012-08-03 (×2): via INTRAVENOUS

## 2012-08-03 MED ORDER — LACTATED RINGERS IV SOLN
INTRAVENOUS | Status: DC
  Administered 2012-08-04: 20 mL/h via INTRAVENOUS

## 2012-08-03 MED ORDER — OXYTOCIN BOLUS FROM INFUSION: 500.0000 mL | INTRAVENOUS | Status: DC

## 2012-08-03 MED ORDER — CITRIC ACID-SODIUM CITRATE 334-500 MG/5ML PO SOLN: 30.0000 mL | ORAL | Status: DC | PRN

## 2012-08-03 MED ORDER — PHENYLEPHRINE 40 MCG/ML (10ML) SYRINGE FOR IV PUSH (FOR BLOOD PRESSURE SUPPORT)
PREFILLED_SYRINGE | INTRAVENOUS | Status: AC
  Filled 2012-08-03: qty 5

## 2012-08-03 MED ORDER — PHENYLEPHRINE 40 MCG/ML (10ML) SYRINGE FOR IV PUSH (FOR BLOOD PRESSURE SUPPORT)
80.0000 ug | PREFILLED_SYRINGE | INTRAVENOUS | Status: DC | PRN
  Administered 2012-08-03: 80 ug via INTRAVENOUS

## 2012-08-03 MED ORDER — LACTATED RINGERS IV SOLN
500.0000 mL | INTRAVENOUS | Status: DC | PRN
  Administered 2012-08-03: 500 mL via INTRAVENOUS

## 2012-08-03 MED ORDER — FENTANYL 2.5 MCG/ML BUPIVACAINE 1/10 % EPIDURAL INFUSION (WH - ANES)
INTRAMUSCULAR | Status: AC
  Filled 2012-08-03: qty 125

## 2012-08-03 MED ORDER — OXYTOCIN 40 UNITS IN LACTATED RINGERS INFUSION - SIMPLE MED
62.5000 mL/h | INTRAVENOUS | Status: DC
  Administered 2012-08-03: 62.5 mL/h via INTRAVENOUS
  Filled 2012-08-03: qty 1000

## 2012-08-03 MED ORDER — SENNOSIDES-DOCUSATE SODIUM 8.6-50 MG PO TABS
2.0000 | ORAL_TABLET | Freq: Every day | ORAL | Status: DC
  Administered 2012-08-03 – 2012-08-04 (×2): 2 via ORAL

## 2012-08-03 MED ORDER — IBUPROFEN 600 MG PO TABS: 600.0000 mg | ORAL_TABLET | Freq: Four times a day (QID) | ORAL | Status: DC | PRN

## 2012-08-03 MED ORDER — DIBUCAINE 1 % RE OINT
1.0000 "application " | TOPICAL_OINTMENT | RECTAL | Status: DC | PRN
  Administered 2012-08-03: 1 via RECTAL
  Filled 2012-08-03: qty 28

## 2012-08-03 MED ORDER — EPHEDRINE 5 MG/ML INJ: 10.0000 mg | INTRAVENOUS | Status: DC | PRN

## 2012-08-03 MED ORDER — PHENYLEPHRINE 40 MCG/ML (10ML) SYRINGE FOR IV PUSH (FOR BLOOD PRESSURE SUPPORT): 80.0000 ug | PREFILLED_SYRINGE | INTRAVENOUS | Status: DC | PRN

## 2012-08-03 MED ORDER — LANOLIN HYDROUS EX OINT: TOPICAL_OINTMENT | CUTANEOUS | Status: DC | PRN

## 2012-08-03 MED ORDER — WITCH HAZEL-GLYCERIN EX PADS
1.0000 "application " | MEDICATED_PAD | CUTANEOUS | Status: DC | PRN
  Administered 2012-08-03: 1 via TOPICAL

## 2012-08-03 MED ORDER — ACETAMINOPHEN 325 MG PO TABS: 650.0000 mg | ORAL_TABLET | ORAL | Status: DC | PRN

## 2012-08-03 MED ORDER — BENZOCAINE-MENTHOL 20-0.5 % EX AERO
1.0000 "application " | INHALATION_SPRAY | CUTANEOUS | Status: DC | PRN
  Administered 2012-08-03: 1 via TOPICAL
  Filled 2012-08-03: qty 56

## 2012-08-03 MED ORDER — FAMOTIDINE 20 MG PO TABS
40.0000 mg | ORAL_TABLET | Freq: Once | ORAL | Status: AC
  Administered 2012-08-04: 40 mg via ORAL

## 2012-08-03 MED ORDER — ONDANSETRON HCL 4 MG/2ML IJ SOLN: 4.0000 mg | Freq: Four times a day (QID) | INTRAMUSCULAR | Status: DC | PRN

## 2012-08-03 MED ORDER — OXYCODONE-ACETAMINOPHEN 5-325 MG PO TABS: 1.0000 | ORAL_TABLET | ORAL | Status: DC | PRN

## 2012-08-03 MED ORDER — SODIUM BICARBONATE 8.4 % IV SOLN
INTRAVENOUS | Status: DC | PRN
  Administered 2012-08-03: 5 mL via EPIDURAL

## 2012-08-03 MED ORDER — ONDANSETRON HCL 4 MG/2ML IJ SOLN: 4.0000 mg | INTRAMUSCULAR | Status: DC | PRN

## 2012-08-03 MED ORDER — DIPHENHYDRAMINE HCL 50 MG/ML IJ SOLN
12.5000 mg | INTRAMUSCULAR | Status: DC | PRN
  Administered 2012-08-03: 12.5 mg via INTRAVENOUS
  Filled 2012-08-03: qty 1

## 2012-08-03 MED ORDER — NALBUPHINE SYRINGE 5 MG/0.5 ML
INJECTION | INTRAMUSCULAR | Status: AC
  Administered 2012-08-03: 5 mg
  Filled 2012-08-03: qty 0.5

## 2012-08-03 MED ORDER — ONDANSETRON HCL 4 MG PO TABS: 4.0000 mg | ORAL_TABLET | ORAL | Status: DC | PRN

## 2012-08-03 MED ORDER — TAMSULOSIN HCL 0.4 MG PO CAPS
0.4000 mg | ORAL_CAPSULE | Freq: Every day | ORAL | Status: DC
Start: 2012-08-03 — End: 2012-08-05
  Administered 2012-08-04: 0.4 mg via ORAL
  Filled 2012-08-03 (×4): qty 1

## 2012-08-03 MED ORDER — DIPHENHYDRAMINE HCL 25 MG PO CAPS: 25.0000 mg | ORAL_CAPSULE | Freq: Four times a day (QID) | ORAL | Status: DC | PRN

## 2012-08-03 NOTE — Anesthesia Preprocedure Evaluation (Signed)
Anesthesia Evaluation  Patient identified by MRN, date of birth, ID band Patient awake    Reviewed: Allergy & Precautions, H&P , NPO status , Patient's Chart, lab work & pertinent test results  Airway Mallampati: II TM Distance: >3 FB Neck ROM: Full    Dental  (+) Teeth Intact and Dental Advisory Given   Pulmonary neg pulmonary ROS,  breath sounds clear to auscultation  Pulmonary exam normal       Cardiovascular negative cardio ROS  Rhythm:Regular Rate:Normal     Neuro/Psych negative neurological ROS  negative psych ROS   GI/Hepatic negative GI ROS, Neg liver ROS,   Endo/Other  negative endocrine ROS  Renal/GU negative Renal ROS  negative genitourinary   Musculoskeletal negative musculoskeletal ROS (+)   Abdominal   Peds negative pediatric ROS (+)  Hematology negative hematology ROS (+)   Anesthesia Other Findings   Reproductive/Obstetrics (+) Pregnancy (20 weeks)                           Anesthesia Physical  Anesthesia Plan  ASA: II  Anesthesia Plan: Epidural   Post-op Pain Management:    Induction:   Airway Management Planned:   Additional Equipment:   Intra-op Plan:   Post-operative Plan:   Informed Consent: I have reviewed the patients History and Physical, chart, labs and discussed the procedure including the risks, benefits and alternatives for the proposed anesthesia with the patient or authorized representative who has indicated his/her understanding and acceptance.     Plan Discussed with: CRNA  Anesthesia Plan Comments: (Labs checked- platelets confirmed with RN in room. Fetal heart tracing, per RN, reported to be stable enough for sitting procedure. Discussed epidural, and patient consents to the procedure:  included risk of possible headache,backache, failed block, allergic reaction, and nerve injury. This patient was asked if she had any questions or concerns  before the procedure started. )        Anesthesia Quick Evaluation

## 2012-08-03 NOTE — MAU Note (Signed)
Pt reports having ctx  On and off since last nihgt. q3-4 min now. Denies leaking or bleeding at this time.  Dilated 2cm last week in office.

## 2012-08-03 NOTE — H&P (Signed)
[name] Bass is a 26 y.[place]. female presenting for painful regular contractions since approx 6:30 am with increased frequency and intensity.  Pt denies any signs/symptoms of HSV currently or in the past 2 weeks and has been using prophylactic Valtrex.  Plans PP BTL.  Maternal Medical History:  Reason for admission: Reason for admission: contractions.  Contractions: Onset was 6-12 hours ago.   Frequency: regular.   Duration is approximately 60 seconds.   Perceived severity is strong.    Fetal activity: Perceived fetal activity is normal.   Last perceived fetal movement was within the past hour.    Prenatal complications: Nephrolithiasis.   Prenatal Complications - Diabetes: none.   History of Present Pregnancy: Pt entered care at 7w 3d.  Current pregnancy complicated by renal lithiasis with cystoscopy and retrieval of stone by urology at 19.2 wks.  Quad screen was WNL.  Pt with slightly abnormal 1hr GTT and normal 3hr GTT.  Pt declined flu vaccine during pregnancy.  Pregnancy otherwise uncomplicated.  Previous OB hx significant for macrosomia with no hx shoulder dystocia.    OB History    Grav Para Term Preterm Abortions TAB SAB Ect Mult Living   3 2 2       2      Past Medical History  Diagnosis Date  . Abnormal Pap smear   . Polycystic ovarian syndrome 05/08/05  . 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 CIN I (cervical intraepithelial neoplasia I)   . Herpes simplex without mention of complication   . FHx: suicide   . H/Bass amenorrhea 02/19/04  . Vaginitis and vulvovaginitis 04/29/04  . Bacterial vaginosis 04/29/04  . Dysuria 04/04/05  . Irregular periods/menstrual cycles   . Hx: UTI (urinary tract infection)   . Dyspareunia 05/02/10  . Herpes simplex type 2 infection     "last outbreak 3 weeks ago"  . Chronic kidney disease    Past Surgical History  Procedure Date  . Cholecystectomy, laparoscopic 2010  . Wisdom tooth extraction 2001  . Tonsillectomy 2006  . Cystoscopy with  ureteroscopy 03/28/2012    Procedure: CYSTOSCOPY WITH URETEROSCOPY;  Surgeon: Marcine Matar, MD;  Location: [facility];  Service: Urology;  Laterality: Right;  RIGHT URETEROSCOPIC STONE EXTRACTION W/ LASER (Pt is [redacted] wk pregnant)    . Stone extraction with basket 03/28/2012    Procedure: STONE EXTRACTION WITH BASKET;  Surgeon: Marcine Matar, MD;  Location: [facility];  Service: Urology;  Laterality: Right;   Family History: family history includes Cancer in her maternal aunt, maternal grandmother, and maternal uncle; Diabetes in her maternal aunt and maternal uncle; Heart disease in her father; Hypertension in her paternal uncle; and Suicidality in her mother. Social History:  reports that she has never smoked. She has never used smokeless tobacco. She reports that she does not drink alcohol or use illicit drugs.   Prenatal Transfer Tool  Maternal Diabetes: No Genetic Screening: Normal Maternal Ultrasounds/Referrals: Referral to uro.  No fetal abn on ultrasounds. Fetal Ultrasounds or other Referrals:  None Maternal Substance Abuse:  No Significant Maternal Medications:  Meds include: Other: Flomax Significant Maternal Lab Results:  None Other Comments:  None  Review of Systems  Constitutional: Negative.   HENT: Negative.   Eyes: Negative.   Respiratory: Negative.   Cardiovascular: Negative.   Gastrointestinal: Negative.   Genitourinary: Negative.   Musculoskeletal: Negative.   Skin: Negative.   Neurological: Negative.   Endo/Heme/Allergies: Negative.   Psychiatric/Behavioral: Negative.     Dilation: 4  Effacement (%): 70 Station: -2 Exam by:: N. Velia Pamer CNM  Blood pressure 130/73, pulse 70, temperature 97.7 F (36.5 C), temperature source Oral, resp. rate 18, height 5\' 10"  (1.778 m), weight 222 lb 3.2 oz (100.789 kg), last menstrual period 10/06/2011. Maternal Exam:  Uterine Assessment: Contraction strength is firm.  Contraction frequency is regular.   Abdomen: Patient reports  no abdominal tenderness. Fundal height is 40.   Estimated fetal weight is 7.5.   Fetal presentation: vertex  Introitus: Normal vulva. Normal vagina.  Ferning test: not done.  Nitrazine test: not done.  Pelvis: adequate for delivery.   Cervix: Cervix evaluated by sterile speculum exam and digital exam.     Fetal Exam Fetal Monitor Review: Mode: ultrasound.   Baseline rate: 145.  Variability: moderate (6-25 bpm).   Pattern: accelerations present and no decelerations.    Fetal State Assessment: Category I - tracings are normal.     Physical Exam  Constitutional: She is oriented to person, place, and time. She appears well-developed and well-nourished.  HENT:  Head: Normocephalic and atraumatic.  Right Ear: External ear normal.  Left Ear: External ear normal.  Nose: Nose normal.  Eyes: Conjunctivae normal and EOM are normal. Pupils are equal, round, and reactive to light.  Neck: Normal range of motion. Neck supple. No thyromegaly present.  Cardiovascular: Normal rate, regular rhythm and intact distal pulses.   Respiratory: Effort normal and breath sounds normal.  GI: Soft. Bowel sounds are normal.  Genitourinary: Vagina normal and uterus normal.       No visible evidence of HSV lesions with speculum exam or on inspection of external genitalia.  Musculoskeletal: Normal range of motion.  Neurological: She is alert and oriented to person, place, and time. She has normal reflexes.  Skin: Skin is warm and dry.  Psychiatric: She has a normal mood and affect. Her behavior is normal.    Prenatal labs: ABO, Rh: A/POS/-- (05/29 1714) Antibody: NEG (05/29 1714) Rubella: 115.2 (05/29 1714) RPR: NON REAC (09/19 1042)  HBsAg: NEGATIVE (05/29 1714)  HIV: NON REACTIVE (05/29 1714)  GBS: Negative (11/18 0000)   Assessment/Plan: IUP at 39w 1d Active labor. Hx renal lithiasis Hx HSV-no evidence of current lesions. Desires PP BTL  Admit to birthing suites. Routine admission  orders. Desires epidural for pain relief during labor. Dr. Pennie Rushing notified.    [name] Bass. 08/03/2012, 2:03 PM

## 2012-08-03 NOTE — Progress Notes (Signed)
Patient ID: [name] Bass, female   DOB: 28-May-1986, 26 y.o.   MRN: [id number]  The patient is several hours postpartum and very stable.Patient desires permanent sterilization. She wants no more children. Other reversible forms of contraception were discussed with patient; she declines all other modalities. Risks of procedure discussed with patient including but not limited to: risk of regret, permanence of method, bleeding, infection, injury to surrounding organs and need for additional procedures.  Failure risk of 0.5-1% with increased risk of ectopic gestation if pregnancy occurs was also discussed with patient.  Patient verbalized understanding of these risks and wants to proceed with sterilization.  Written informed consent obtained.

## 2012-08-03 NOTE — Anesthesia Procedure Notes (Signed)
Epidural Patient location during procedure: OB  Preanesthetic Checklist Completed: patient identified, site marked, surgical consent, pre-op evaluation, timeout performed, IV checked, risks and benefits discussed and monitors and equipment checked  Epidural Patient position: sitting Prep: site prepped and draped and DuraPrep Patient monitoring: continuous pulse ox and blood pressure Approach: midline Injection technique: LOR air  Needle:  Needle type: Tuohy  Needle gauge: 17 G Needle length: 9 cm and 9 Needle insertion depth: 5 cm cm Catheter type: closed end flexible Catheter size: 19 Gauge Catheter at skin depth: 11 cm Test dose: negative  Assessment Events: blood not aspirated, injection not painful, no injection resistance, negative IV test and no paresthesia  Additional Notes Dosing of Epidural:  1st dose, through catheter ............................................. epi 1:200K + Xylocaine 40 mg  2nd dose, through catheter, after waiting 3 minutes.....epi 1:200K + Xylocaine 60 mg    ( 2% Xylo charted as a single dose in Epic Meds for ease of charting; actual dosing was fractionated as above, for saftey's sake)  As each dose occurred, patient was free of IV sx; and patient exhibited no evidence of SA injection.  Patient is more comfortable after epidural dosed. Please see RN's note for documentation of vital signs,and FHR which are stable.  Patient reminded not to try to ambulate with numb legs, and that an RN must be present when she attempts to get up.       

## 2012-08-04 ENCOUNTER — Encounter (HOSPITAL_COMMUNITY)
Admission: AD | Disposition: A | Discharge status: Home / Self Care | Payer: Self-pay | Source: Ambulatory Visit | Attending: Obstetrics and Gynecology

## 2012-08-04 ENCOUNTER — Encounter (HOSPITAL_COMMUNITY): Payer: Self-pay

## 2012-08-04 ENCOUNTER — Encounter (HOSPITAL_COMMUNITY): Payer: Self-pay | Admitting: Obstetrics and Gynecology

## 2012-08-04 ENCOUNTER — Inpatient Hospital Stay (HOSPITAL_COMMUNITY): Payer: Medicaid Other

## 2012-08-04 DIAGNOSIS — Z302 Encounter for sterilization: Secondary | ICD-10-CM

## 2012-08-04 HISTORY — PX: TUBAL LIGATION: SHX77

## 2012-08-04 LAB — CBC
Hemoglobin: 11.8 g/dL — ABNORMAL LOW (ref 12.0–15.0)
MCH: 31.7 pg (ref 26.0–34.0)
MCV: 91.9 fL (ref 78.0–100.0)
RBC: 3.72 MIL/uL — ABNORMAL LOW (ref 3.87–5.11)

## 2012-08-04 SURGERY — LIGATION, FALLOPIAN TUBE, POSTPARTUM
Anesthesia: Epidural | Site: Abdomen | Laterality: Bilateral | Wound class: Clean Contaminated

## 2012-08-04 MED ORDER — ONDANSETRON HCL 4 MG/2ML IJ SOLN
INTRAMUSCULAR | Status: AC
  Filled 2012-08-04: qty 2

## 2012-08-04 MED ORDER — FENTANYL CITRATE 0.05 MG/ML IJ SOLN
INTRAMUSCULAR | Status: AC
  Filled 2012-08-04: qty 2

## 2012-08-04 MED ORDER — BUPIVACAINE HCL (PF) 0.25 % IJ SOLN
INTRAMUSCULAR | Status: AC
  Filled 2012-08-04: qty 30

## 2012-08-04 MED ORDER — SODIUM BICARBONATE 8.4 % IV SOLN
INTRAVENOUS | Status: DC | PRN
  Administered 2012-08-04: 3 mL via EPIDURAL

## 2012-08-04 MED ORDER — PROMETHAZINE HCL 25 MG/ML IJ SOLN: 6.2500 mg | INTRAMUSCULAR | Status: DC | PRN

## 2012-08-04 MED ORDER — BUPIVACAINE HCL (PF) 0.25 % IJ SOLN
INTRAMUSCULAR | Status: DC | PRN
  Administered 2012-08-04: 10 mL

## 2012-08-04 MED ORDER — CHLORHEXIDINE GLUCONATE CLOTH 2 % EX PADS
6.0000 | MEDICATED_PAD | Freq: Every day | CUTANEOUS | Status: DC
  Administered 2012-08-04: 6 via TOPICAL

## 2012-08-04 MED ORDER — LIDOCAINE HCL (CARDIAC) 20 MG/ML IV SOLN
INTRAVENOUS | Status: AC
  Filled 2012-08-04: qty 5

## 2012-08-04 MED ORDER — KETOROLAC TROMETHAMINE 30 MG/ML IJ SOLN: 15.0000 mg | Freq: Once | INTRAMUSCULAR | Status: DC | PRN

## 2012-08-04 MED ORDER — SODIUM BICARBONATE 8.4 % IV SOLN
INTRAVENOUS | Status: AC
  Filled 2012-08-04: qty 50

## 2012-08-04 MED ORDER — SODIUM BICARBONATE 8.4 % IV SOLN
INTRAVENOUS | Status: DC | PRN
  Administered 2012-08-04: 5 mL via EPIDURAL

## 2012-08-04 MED ORDER — DEXAMETHASONE SODIUM PHOSPHATE 10 MG/ML IJ SOLN
INTRAMUSCULAR | Status: AC
  Filled 2012-08-04: qty 1

## 2012-08-04 MED ORDER — MUPIROCIN 2 % EX OINT
1.0000 "application " | TOPICAL_OINTMENT | Freq: Two times a day (BID) | CUTANEOUS | Status: DC
  Administered 2012-08-04: 1 via NASAL
  Filled 2012-08-04: qty 22

## 2012-08-04 MED ORDER — MEPERIDINE HCL 25 MG/ML IJ SOLN: 6.2500 mg | INTRAMUSCULAR | Status: DC | PRN

## 2012-08-04 MED ORDER — FENTANYL CITRATE 0.05 MG/ML IJ SOLN
INTRAMUSCULAR | Status: DC | PRN
  Administered 2012-08-04 (×2): 50 ug via INTRAVENOUS

## 2012-08-04 MED ORDER — LACTATED RINGERS IV SOLN: INTRAVENOUS | Status: DC

## 2012-08-04 MED ORDER — MIDAZOLAM HCL 5 MG/5ML IJ SOLN
INTRAMUSCULAR | Status: DC | PRN
  Administered 2012-08-04 (×2): 1 mg via INTRAVENOUS

## 2012-08-04 MED ORDER — PROPOFOL 10 MG/ML IV BOLUS
INTRAVENOUS | Status: DC | PRN
  Administered 2012-08-04 (×6): 10 mg via INTRAVENOUS

## 2012-08-04 MED ORDER — HYDROCODONE-ACETAMINOPHEN 5-325 MG PO TABS
1.0000 | ORAL_TABLET | ORAL | Status: DC | PRN
  Administered 2012-08-04 (×3): 1 via ORAL
  Administered 2012-08-05: 2 via ORAL
  Filled 2012-08-04 (×3): qty 1
  Filled 2012-08-04: qty 2

## 2012-08-04 MED ORDER — DEXAMETHASONE SODIUM PHOSPHATE 10 MG/ML IJ SOLN
INTRAMUSCULAR | Status: DC | PRN
Start: 2012-08-04 — End: 2012-08-04
  Administered 2012-08-04: 10 mg via INTRAVENOUS

## 2012-08-04 MED ORDER — LACTATED RINGERS IV SOLN
INTRAVENOUS | Status: DC | PRN
  Administered 2012-08-04 (×2): via INTRAVENOUS

## 2012-08-04 MED ORDER — MIDAZOLAM HCL 2 MG/2ML IJ SOLN
INTRAMUSCULAR | Status: AC
  Filled 2012-08-04: qty 2

## 2012-08-04 MED ORDER — MUPIROCIN 2 % EX OINT
1.0000 "application " | TOPICAL_OINTMENT | Freq: Two times a day (BID) | CUTANEOUS | Status: DC
  Filled 2012-08-04: qty 22

## 2012-08-04 MED ORDER — ONDANSETRON HCL 4 MG/2ML IJ SOLN
INTRAMUSCULAR | Status: DC | PRN
  Administered 2012-08-04: 4 mg via INTRAVENOUS

## 2012-08-04 MED ORDER — FAMOTIDINE 20 MG PO TABS
40.0000 mg | ORAL_TABLET | Freq: Once | ORAL | Status: DC
  Filled 2012-08-04: qty 2

## 2012-08-04 MED ORDER — HYDROMORPHONE HCL PF 1 MG/ML IJ SOLN: 0.2500 mg | INTRAMUSCULAR | Status: DC | PRN

## 2012-08-04 MED ORDER — LIDOCAINE-EPINEPHRINE (PF) 2 %-1:200000 IJ SOLN
INTRAMUSCULAR | Status: AC
  Filled 2012-08-04: qty 20

## 2012-08-04 MED ORDER — PROPOFOL 10 MG/ML IV EMUL
INTRAVENOUS | Status: AC
  Filled 2012-08-04: qty 20

## 2012-08-04 MED ORDER — SODIUM CHLORIDE 0.9 % IR SOLN
Status: DC | PRN
  Administered 2012-08-04: 1000 mL

## 2012-08-04 MED ORDER — METOCLOPRAMIDE HCL 10 MG PO TABS: 10.0000 mg | ORAL_TABLET | Freq: Once | ORAL | Status: DC

## 2012-08-04 MED ORDER — VALACYCLOVIR HCL 500 MG PO TABS
500.0000 mg | ORAL_TABLET | Freq: Every day | ORAL | Status: DC
  Filled 2012-08-04 (×2): qty 1

## 2012-08-04 SURGICAL SUPPLY — 27 items
ADH SKN CLS APL DERMABOND .7 (GAUZE/BANDAGES/DRESSINGS)
ADH SKN CLS LQ APL DERMABOND (GAUZE/BANDAGES/DRESSINGS) ×1
CHLORAPREP W/TINT 26ML (MISCELLANEOUS) ×2 IMPLANT
CLOTH BEACON ORANGE TIMEOUT ST (SAFETY) ×2 IMPLANT
CONTAINER PREFILL 10% NBF 15ML (MISCELLANEOUS) ×4 IMPLANT
DERMABOND ADHESIVE PROPEN (GAUZE/BANDAGES/DRESSINGS) ×1
DERMABOND ADVANCED (GAUZE/BANDAGES/DRESSINGS)
DERMABOND ADVANCED .7 DNX12 (GAUZE/BANDAGES/DRESSINGS) IMPLANT
DERMABOND ADVANCED .7 DNX6 (GAUZE/BANDAGES/DRESSINGS) IMPLANT
ELECT REM PT RETURN 9FT ADLT (ELECTROSURGICAL) ×2
ELECTRODE REM PT RTRN 9FT ADLT (ELECTROSURGICAL) ×1 IMPLANT
GLOVE SURG SS PI 6.5 STRL IVOR (GLOVE) ×4 IMPLANT
GOWN PREVENTION PLUS LG XLONG (DISPOSABLE) ×4 IMPLANT
NEEDLE HYPO 22GX1.5 SAFETY (NEEDLE) ×2 IMPLANT
NS IRRIG 1000ML POUR BTL (IV SOLUTION) ×2 IMPLANT
PACK ABDOMINAL MINOR (CUSTOM PROCEDURE TRAY) ×2 IMPLANT
PENCIL BUTTON HOLSTER BLD 10FT (ELECTRODE) ×2 IMPLANT
SLEEVE SCD COMPRESS KNEE LRG (MISCELLANEOUS) ×1 IMPLANT
SPONGE LAP 4X18 X RAY DECT (DISPOSABLE) ×1 IMPLANT
SUT CHROMIC 2 0 SH (SUTURE) ×2 IMPLANT
SUT MONOCRYL 3-0 PS-2 UND MONO (SUTURE) IMPLANT
SUT VIC AB 0 CT2 27 (SUTURE) ×2 IMPLANT
SYR BULB IRRIGATION 50ML (SYRINGE) IMPLANT
SYR CONTROL 10ML LL (SYRINGE) ×2 IMPLANT
TOWEL OR 17X24 6PK STRL BLUE (TOWEL DISPOSABLE) ×4 IMPLANT
TRAY FOLEY CATH 14FR (SET/KITS/TRAYS/PACK) ×1 IMPLANT
WATER STERILE IRR 1000ML POUR (IV SOLUTION) IMPLANT

## 2012-08-04 NOTE — Discharge Summary (Signed)
  Vaginal Delivery Discharge Summary  [name] Bass  DOB:    [birth year] MRN:    [id number] CSN:    [id number]  Date of admission:                  08/03/12  Date of discharge:                   08/05/12  Procedures this admission: SVB Repair of 2nd degree perineal laceration PP BTL   Newborn Data:  Live born female  Birth Weight: 8 lb 14.5 oz (4040 g) APGAR: 9, 10  Home with mother. Name: [place]  History of Present Illness:  Ms. [name] Bass is a 26 y.o. female, G3P3003, who presents at [redacted]w[redacted]d weeks gestation. The patient has been followed at the [facility] and Gynecology division of [facility]. She was admitted onset of labor. Her pregnancy has been complicated by: Patient Active Problem List  Diagnosis  . Lower abdominal pain  . High risk HPV infection  . History of PCOS  . Cervical intraepithelial neoplasia I  . HSV (herpes simplex virus) anogenital infection  . Abnormal Pap smear  . Polycystic ovarian syndrome  . CIN I (cervical intraepithelial neoplasia I)  . Herpes simplex without mention of complication  . FHx: suicide  . Normal pregnancy  . Kidney stone on right side  . Flu vaccine need  . Bilateral kidney stones  . Abnormal glucose tolerance test in pregnancy, antepartum  . Unsure of LMP   . Vaginal delivery     Hospital course:  The patient was admitted in labor on 08/03/12.   Her labor was not complicated. She proceeded to have a vaginal delivery of a healthy infant with epidural anesthesia. Her delivery was not complicated. Her postpartum course was not complicated.  She desired a BTL, which was done on 08/04/12 by Dr. Pennie Rushing.  She was discharged to home on postpartum day 2 doing well.  Vicodin worked better for pain, since [employer] gave her nightmares.  Feeding:  breast  Contraception:  bilateral tubal ligation  Discharge hemoglobin:  Hemoglobin  Date Value Range Status  08/04/2012  11.8* 12.0 - 15.0 g/dL Final     HCT  Date Value Range Status  08/04/2012 34.2* 36.0 - 46.0 % Final    Discharge Physical Exam:   General: alert Lochia: appropriate Uterine Fundus: firm Incision: healing well DVT Evaluation: No evidence of DVT seen on physical exam. Negative Homan's sign.  Intrapartum Procedures: spontaneous vaginal delivery Postpartum Procedures: P.P. tubal ligation Complications-Operative and Postpartum: 2nd degree perineal laceration  Discharge Diagnoses: Term Pregnancy-delivered and desired BTL  Discharge Information:  Activity:           Per [facility] handout Diet:                routine Medications: Ibuprofen and Vicodin Condition:      stable Instructions:  refer to practice specific booklet Discharge to: home  Will f/u with nephrologist pp regarding kidney stones.     [name] Bass 08/04/2012

## 2012-08-04 NOTE — Op Note (Signed)
08/03/2012 - 08/04/2012  8:46 AM  PATIENT:  [name] Bass  26 y.o. female  PRE-OPERATIVE DIAGNOSIS:  desires permanent sterilization  POST-OPERATIVE DIAGNOSIS:  desires permanent sterilization  PROCEDURE:  Procedure(s): POST PARTUM TUBAL LIGATION  SURGEON:  Surgeon(s): Hal Morales, MD  ASSISTANTS: none   ANESTHESIA:   epidural  ESTIMATED BLOOD LOSS: * No blood loss amount entered *   BLOOD ADMINISTERED:none  LOCAL MEDICATIONS USED:  MARCAINE     COMPLICATIONS:none  FINDINGS:normal appearing tubes  SPECIMEN:  Source of Specimen:  portions of right and left tube  DISPOSITION OF SPECIMEN:  PATHOLOGY  COUNTS:  YES  DESCRIPTION OF PROCEDURE:the patient was taken to the operating room after appropriate identification and placed on the operating table. The abdomen was prepped with multiple layers of chlor prep and draped as a sterile field. Subumbilical injection of 10 cc of quarter percent Marcaine was undertaken. A subumbilical incision was made and the peritoneum entered with a combination of blunt and sharp dissection.  The left fallopian tube was then identified, followed to its fimbriated end, and grasped at the isthmic portion and elevated. A suture of 2-0 chromic was placed through the mesosalpinx and tied fore and aft on the tube. A second ligature was placed proximal to that and the intervening knuckle of tube excised. The cut ends were cauterized. A similar procedure was carried out on the opposite side. Hemostasis was noted to be adequate.  The abdominal peritoneum was closed in pursestring fashion with a suture of 0 Vicryl. The fascia was closed with a suture of 0 Vicryl in a running fashion. The subumbilical incision was closed with a subcuticular suture of 3-0 Monocryl. Dermabond dressing was applied and the patient taken from the operating room to the recovery room in satisfactory condition having tolerated the procedure well the sponge and instrument  counts correct.  PLAN OF CARE: Return to mother-baby unit for postpartum stay  PATIENT DISPOSITION:  PACU - hemodynamically stable.   Delay start of Pharmacological VTE agent (>24hrs) due to surgical blood loss or risk of bleeding:  not applicable.  SCDs were used during the case

## 2012-08-04 NOTE — Progress Notes (Signed)
[name] Bass is a 26 y.o. G3P3003 at [redacted]w[redacted]d  admitted for active labor  Subjective: Pt now getting comfortable after epidural placement.  Does have "hot spot" in RLQ of abdomen but pt states is tolerable.  Denies other complaints at present.  States she felt "pop" and sm trickle of fluid when on bedpan to void after epidural placement.    Objective: BP 115/64; P: 92; Temperature 98.7. R: 18.    FHT:  FHR: 150 bpm, variability: moderate,  accelerations:  Present,  decelerations:  Present Late decels noted after epidural placement, resolved after position changes and administration of ephedrine. UC:   regular, every 1.5-2.5 minutes SVE:   Dilation: 7cm Effacement (%): 90 Station: -1 Exam by: Elsie Ra, CNM No amniotic fluid noted on perineum but no palpable membranes on exam and sm amt mucus noted after exam.    Assessment / Plan: Spontaneous labor, progressing normally  Labor: Progressing normally Preeclampsia:  no signs or symptoms of toxicity Fetal Wellbeing:  Category I Pain Control:  Epidural I/D:  n/a Anticipated MOD:  NSVD  Continue expectant management and observe FHR carefully.  Julias Mould O. 08/04/2012, 8:16 AM

## 2012-08-04 NOTE — Transfer of Care (Signed)
Immediate Anesthesia Transfer of Care Note  Patient: [name] Bass  Procedure(s) Performed: Procedure(s) (LRB) with comments: POST PARTUM TUBAL LIGATION (Bilateral) - Bilateral post partum tubal ligation  Patient Location: PACU  Anesthesia Type:Epidural  Level of Consciousness: awake, alert  and oriented  Airway & Oxygen Therapy: Patient Spontanous Breathing  Post-op Assessment: Report given to PACU RN and Post -op Vital signs reviewed and stable  Post vital signs: Reviewed and stable  Complications: No apparent anesthesia complications

## 2012-08-04 NOTE — Anesthesia Preprocedure Evaluation (Signed)
Anesthesia Evaluation  Patient identified by MRN, date of birth, ID band Patient awake    Reviewed: Allergy & Precautions, H&P , NPO status , Patient's Chart, lab work & pertinent test results  Airway Mallampati: II TM Distance: >3 FB Neck ROM: Full    Dental  (+) Teeth Intact and Dental Advisory Given   Pulmonary neg pulmonary ROS,  breath sounds clear to auscultation  Pulmonary exam normal       Cardiovascular negative cardio ROS  Rhythm:Regular Rate:Normal     Neuro/Psych negative neurological ROS  negative psych ROS   GI/Hepatic negative GI ROS, Neg liver ROS,   Endo/Other  negative endocrine ROS  Renal/GU negative Renal ROS  negative genitourinary   Musculoskeletal negative musculoskeletal ROS (+)   Abdominal   Peds negative pediatric ROS (+)  Hematology negative hematology ROS (+)   Anesthesia Other Findings   Reproductive/Obstetrics (+) Pregnancy (20 weeks)                           Anesthesia Physical  Anesthesia Plan  ASA: II  Anesthesia Plan: Epidural   Post-op Pain Management:    Induction:   Airway Management Planned:   Additional Equipment:   Intra-op Plan:   Post-operative Plan:   Informed Consent: I have reviewed the patients History and Physical, chart, labs and discussed the procedure including the risks, benefits and alternatives for the proposed anesthesia with the patient or authorized representative who has indicated his/her understanding and acceptance.     Plan Discussed with: CRNA and Surgeon  Anesthesia Plan Comments: (Labs checked- platelets confirmed with RN in room. Fetal heart tracing, per RN, reported to be stable enough for sitting procedure. Discussed epidural, and patient consents to the procedure:  included risk of possible headache,backache, failed block, allergic reaction, and nerve injury. This patient was asked if she had any questions  or concerns before the procedure started. Pt for PPTL with epidural in - situ.)        Anesthesia Quick Evaluation

## 2012-08-04 NOTE — H&P (Signed)
History and Physical Interval Note:   08/04/2012   7:46 AM   [name] Bass  has presented today for surgery, with the diagnosis of desires permanent sterilization  The various methods of treatment have been discussed with the patient and family. After consideration of risks, benefits and other options for treatment, the patient has consented to  Procedure(s): POST PARTUM TUBAL LIGATION as a surgical intervention .  I have examined the patient and  reviewed the patients' chart and labs.  Questions were answered to the patient's satisfaction.   Pertinent labs are Hgb=11.6 and positive staph aureus.  Both have been appropriately addressed  Hal Morales  MD

## 2012-08-04 NOTE — Anesthesia Postprocedure Evaluation (Signed)
  Anesthesia Post-op Note  Patient: [name] Bass  Procedure(s) Performed: * No procedures listed *  Patient Location: Mother/Baby  Anesthesia Type:Epidural  Level of Consciousness: awake, alert  and oriented  Airway and Oxygen Therapy: Patient Spontanous Breathing  Post-op Pain: mild  Post-op Assessment: Patient's Cardiovascular Status Stable, Respiratory Function Stable, Patent Airway, No signs of Nausea or vomiting and Pain level controlled  Post-op Vital Signs: stable  Complications: No apparent anesthesia complications

## 2012-08-04 NOTE — Progress Notes (Addendum)
Post Partum Day 1:S/P SVB, 2nd degree Subjective: Patient up ad lib, denies syncope or dizziness. Had BTL this am.  Declines Percocet--makes her have bad dreams.  Does want something stronger than Motrin for pain.  Has taken Vicodin before without difficulty.  Kidney stone pain stable--isn't present every day, but still does occur fairly frequently.  No scheduled f/u with Dr. Retta Diones, urologist, other than prn. Feeding:  Breast Contraceptive plan:   BTL today  Objective: Blood pressure 122/85, pulse 96, temperature 98.1 F (36.7 C), temperature source Oral, resp. rate 20, height 5\' 10"  (1.778 m), weight 222 lb 3.2 oz (100.789 kg), last menstrual period 10/06/2011, SpO2 93.00%, unknown if currently breastfeeding.  Physical Exam:  General: alert Lochia: appropriate Uterine Fundus: firm Incision: Dressing CDI DVT Evaluation: No evidence of DVT seen on physical exam. Negative Homan's sign.   Basename 08/04/12 0515 08/03/12 1400  HGB 11.8* 13.2  HCT 34.2* 38.1    Assessment/Plan: S/P Vaginal delivery day 1 S/P BTL this am Hx kidney stones  Continue current care Will d/c Percocet and Rx Vicodin. Plan for discharge tomorrow   LOS: 1 day   Nigel Bridgeman 08/04/2012, 8:47 AM

## 2012-08-04 NOTE — Anesthesia Postprocedure Evaluation (Signed)
Anesthesia Post Note  Patient: [name] Bass  Procedure(s) Performed: Procedure(s) (LRB): POST PARTUM TUBAL LIGATION (Bilateral)  Anesthesia type: Epidural  Patient location: PACU  Post pain: Pain level controlled  Post assessment: Post-op Vital signs reviewed  Last Vitals:  Filed Vitals:   08/04/12 0930  BP: 112/60  Pulse: 83  Temp:   Resp: 20    Post vital signs: Reviewed  Level of consciousness: awake  Complications: No apparent anesthesia complications

## 2012-08-05 ENCOUNTER — Encounter (HOSPITAL_COMMUNITY): Payer: Self-pay | Admitting: Obstetrics and Gynecology

## 2012-08-05 ENCOUNTER — Inpatient Hospital Stay (HOSPITAL_COMMUNITY): Admission: RE | Admit: 2012-08-05 | Payer: Medicaid Other | Source: Ambulatory Visit

## 2012-08-05 MED ORDER — IBUPROFEN 600 MG PO TABS: 600.0000 mg | ORAL_TABLET | Freq: Four times a day (QID) | ORAL | Status: DC | PRN

## 2012-08-05 MED ORDER — HYDROCODONE-ACETAMINOPHEN 5-325 MG PO TABS: 1.0000 | ORAL_TABLET | ORAL | Status: DC | PRN

## 2012-08-05 NOTE — Discharge Instructions (Signed)
Postpartum Care After Vaginal Delivery  After you deliver your baby, you will stay in the hospital for 24 to 72 hours, unless there were problems with the labor or delivery, or you have medical problems. While you are in the hospital, you will receive help and instructions on how to care for yourself and your baby.  Your doctor will order pain medicine, in case you need it. You will have a small amount of bleeding from your vagina and should change your sanitary pad frequently. Wash your hands thoroughly with soap and water for at least 20 seconds after changing pads and using the toilet. Let the nurses know if you begin to pass blood clots or your bleeding increases. Do not flush blood clots down the toilet before having the nurse look at them, to make sure there is no placental tissue with them.  If you had an intravenous (IV), it will be removed within 24 hours, if there are no problems. The first time you get out of bed or take a shower, call the nurse to help you because you may get weak, lightheaded, or even faint. If you are breastfeeding, you may feel painful contractions of your uterus for a couple of weeks. This is normal. The contractions help your uterus get back to normal size. If you are not breastfeeding, wear a supportive bra and handle your breasts as little as possible until your milk has dried up. Hormones should not be given to dry up the breasts, because they can cause blood clots. You will be given your normal diet, unless you have diabetes or other medical problems.   The nurses may put an ice pack on your episiotomy (surgically enlarged opening), if you have one, to reduce the pain and swelling. On rare occasions, you may not be able to urinate and the nurse will need to empty your bladder with a catheter. If you had a postpartum tubal ligation ("tying tubes," female sterilization), it should not make your stay in the hospital longer.  You may have your baby in your room with you as much as  you like, unless you or the baby has a problem. Use the bassinet (basket) for the baby when going to and from the nursery. Do not carry the baby. Do not leave the postpartum area. If the mother is Rh negative (lacks a protein on the red blood cells) and the baby is Rh positive, the mother should get a Rho-gam shot to prevent Rh problems with future pregnancies.  You may be given written instructions for you and your baby, and necessary medicines, when you are discharged from the hospital. Be sure you understand and follow the instructions as advised.  HOME CARE INSTRUCTIONS    Follow instructions and take the medicines given to you.   Only take over-the-counter or prescription medicines for pain, discomfort, or fever as directed by your caregiver.   Do not take aspirin, because it can cause bleeding.   Increase your activities a little bit every day to build up your strength and endurance.   Do not drink alcohol, especially if you are breastfeeding or taking pain medicine.   Take your temperature twice a day and record it.   You may have a small amount of bleeding or spotting for 2 to 4 weeks. This is normal.   Do not use tampons or douche. Use sanitary pads.   Try to have someone stay and help you for a few days when you go home.     Try to rest or take a nap when the baby is sleeping.   If you are breastfeeding, wear a good support bra. If you are not breastfeeding, wear a supportive bra and do not stimulate your nipples.   Eat a healthy, nutritious diet and continue to take your prenatal vitamins.   Do not drive, do any heavy activities, or travel until your caregiver tells you it is okay.   Do not have intercourse until your caregiver gives you permission to do so.   Ask your caregiver when you can begin to exercise and what type of exercises to do.   Call your caregiver if you think you are having a problem from your delivery.   Call your pediatrician if you are having a problem with the  baby.   Schedule your postpartum visit and keep it.  SEEK MEDICAL CARE IF:    You have a temperature of 100 F (37.8 C) or higher.   You have increased vaginal bleeding or are passing clots. Save any clots to show your caregiver.   You have bloody urine or pain when you urinate.   You have a bad smelling vaginal discharge.   You have increasing pain or swelling on your episiotomy.   You develop a severe headache.   You feel depressed.   The episiotomy is separating.   You become dizzy or lightheaded.   You develop a rash.   You have a reaction or problems with your medicine.   You have pain, redness, or swelling at the intravenous site.  SEEK IMMEDIATE MEDICAL CARE IF:    You have chest pain.   You develop shortness of breath.   You pass out.   You develop pain, with or without swelling or redness in your leg.   You develop heavy vaginal bleeding, with or without blood clots.   You develop stomach pain.   You develop a bad smelling vaginal discharge.  MAKE SURE YOU:    Understand these instructions.   Will watch your condition.   Will get help right away if you are not doing well or get worse.  Document Released: 06/04/2007 Document Revised: 10/30/2011 Document Reviewed: 06/16/2009  Exit[employer] Patient Information 2013 [employer].

## 2012-08-05 NOTE — Progress Notes (Signed)
UR chart review completed.  

## 2012-08-20 ENCOUNTER — Ambulatory Visit (HOSPITAL_COMMUNITY)
Admission: RE | Admit: 2012-08-20 | Discharge: 2012-08-20 | Disposition: A | Discharge status: Home / Self Care | Payer: Medicaid Other | Source: Ambulatory Visit | Attending: Obstetrics and Gynecology | Admitting: Obstetrics and Gynecology

## 2012-08-20 NOTE — Progress Notes (Signed)
Infant Lactation Consultation Outpatient Visit Note  Patient Name: [name] Bass Date of Birth: [birth year] Birth Weight:   Gestational Age at Delivery: Gestational Age: <None> Type of Delivery:   Breastfeeding History Frequency of Breastfeeding: 10/24 hours Length of Feeding: 20-40 minutes Voids: 10 wets/24 hours Stools: 10 stools/ 24 hours  Has at least a smear or full stool with each diaper change per mother              Color observed was yellowish/ orange seedy  Supplementing / Method: Pumping:  Type of Pump: none   Frequency:  Volume:    Comments: Mother is an experienced breastfeeding mother who successfully breast fed her other two children for one year each. Mother reports she is here due to slow week gain of her infant. Baby was 8-14 at birth on Dec 14, weighed 8 lbs-6 oz on 12-16, and 8lbs-9oz on 12-18. Follow up ped visit on Dec 30, weight was 8lbs-9oz. Mother reports exclusively breastfeeding. Infant feeding well, milk let downs and leaking. She states baby acts satiated following the feeding. Today, baby was alert and showing feeding cues. Today's weight is 8 lbs and 11.9oz.   Consultation Evaluation:  Initial Feeding Assessment: Pre-feed Weight: 3966 gms (8 lbs-11.9 oz) Post-feed Weight: 4016 Amount Transferred: 40ml Comments: fed well for 10 minutes on the right breast, audible swallows, calm, rhythmic pattern  Additional Feeding Assessment: Pre-feed Weight: 4016 gms Post-feed Weight: 4050 gms Amount Transferred: 34 ml Comments: baby fed well on the left breast for 15 minutes   Total Breast milk Transferred this Visit: 74 ml Total Supplement Given: none   Additional Interventions: Baby demonstrated adequate feeding and milk transfer. Mother demonstrated confidence with feeding.  Mother was very pleased with the outcome because she does not want to add formula. She plans to exclusively breast feed [place] as she did with her other children. Mother to  contact [facility] if needs further assistance.  Follow-Up [facility] on Aug 23, 2012 Encouraged to attend [facility] Breastfeeding Support Group for support and a scale is available to assess weight gain.    Christella Hartigan M 08/20/2012, 1:24 PM

## 2012-09-13 ENCOUNTER — Encounter: Payer: Medicaid Other | Admitting: Obstetrics and Gynecology

## 2012-09-16 ENCOUNTER — Encounter: Payer: Self-pay | Admitting: Obstetrics and Gynecology

## 2012-09-16 ENCOUNTER — Ambulatory Visit: Payer: Medicaid Other | Admitting: Obstetrics and Gynecology

## 2012-09-16 VITALS — BP 100/58 | Wt 199.0 lb

## 2012-09-16 DIAGNOSIS — Z124 Encounter for screening for malignant neoplasm of cervix: Secondary | ICD-10-CM

## 2012-09-16 NOTE — Progress Notes (Signed)
Date of delivery: 08/03/2012 Female Name: [name] Bass  Vaginal delivery:yes Cesarean section:no Tubal ligation:yes GDM:no Breast Feeding:yes Bottle Feeding:no Post-Partum Blues:no Abnormal pap:yes Normal GU function: yes Normal GI function:yes Returning to work:no EPDS: 2

## 2012-09-16 NOTE — Progress Notes (Signed)
Subjective:     [name] Bass is a 27 y.o. female who presents for a postpartum visit.     General: doing well, breastfeeding without difficulty GI: normal bowel habits, no hemorrhoids GU: normal urinary function, vaginal bleeding has stopped,  Psychiatric: mood stable, no excessive stress at home, plans to return to work YES  I have fully reviewed the prenatal and intrapartum course.   Contraception:   Review of Systems Pertinent items are noted in HPI.   Objective:    BP 100/58  Wt 199 lb (90.266 kg)  Breastfeeding? Yes  General:  alert, cooperative and no distress     Lungs: clear to auscultation bilaterally  Heart:  regular rate and rhythm, S1, S2 normal, no murmur  Abdomen: soft, non-tender; bowel sounds normal; no masses,  no organomegaly, diastasis minimal    Vulva:  normal  Vagina: normal vagina, strong kegel, perineum healed   Cervix:  normal  Uterus : normal size, contour, position, consistency, mobility, non-tender  Adnexa:  normal adnexa             Assessment:     Normal postpartum exam.    s/p BTL  Plan:  Pap smear done at today's visit. S/p BTL  RTO 1 yr or PRN  rv'd healthy eating habits and regular exercise  Sanda Klein, [place]  09/16/2012 1:18 PM

## 2012-09-17 LAB — PAP IG W/ RFLX HPV ASCU

## 2012-09-24 ENCOUNTER — Telehealth: Payer: Self-pay | Admitting: Obstetrics and Gynecology

## 2012-09-24 NOTE — Telephone Encounter (Signed)
Tc to pt regarding msg.  Pt states this is going on the 4th day w/o a BM.  Wants to know what she can take while breastfeeding.  Pt advised may try Dulcolax (pill or suppository), Metamucil and may take stool softeners along with drinking a lot of water.  Pt to call back if no relief.  Pt voices agreement.

## 2013-01-14 IMAGING — US US PELVIS COMPLETE
1 series · 13 of 25 positions shown · non-contrast
Comparison: None.

CLINICAL DATA: Abdominal pain.  Pelvic pain.  Postcoital pelvic
pain.

TRANSABDOMINAL AND TRANSVAGINAL ULTRASOUND OF PELVIS
TECHNIQUE: Both transabdominal and transvaginal ultrasound
examinations of the pelvis were performed. Transabdominal technique
was performed for global imaging of the pelvis including uterus,
ovaries, adnexal regions, and pelvic cul-de-sac.

[Series 1: us pelvis complete · 13 of 91 slices shown]
[im 1/91]
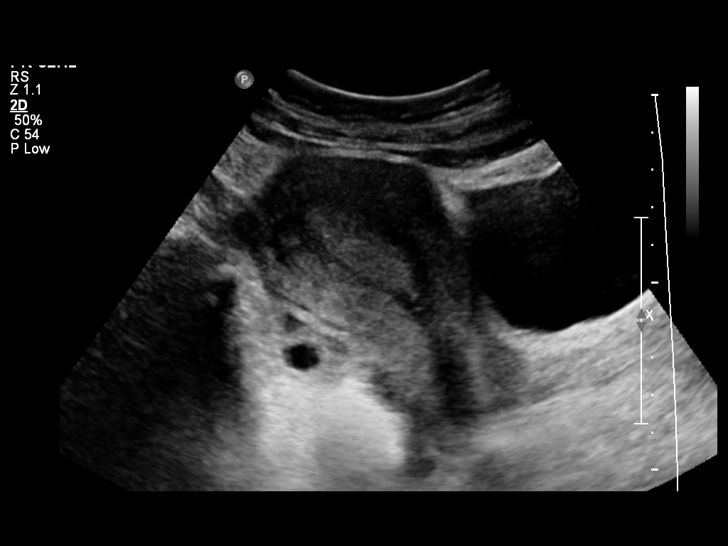
[im 8/91]
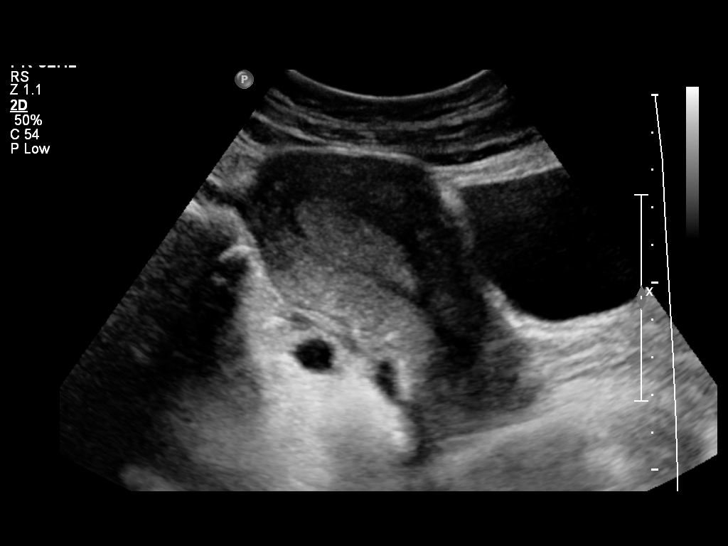
[im 16/91]
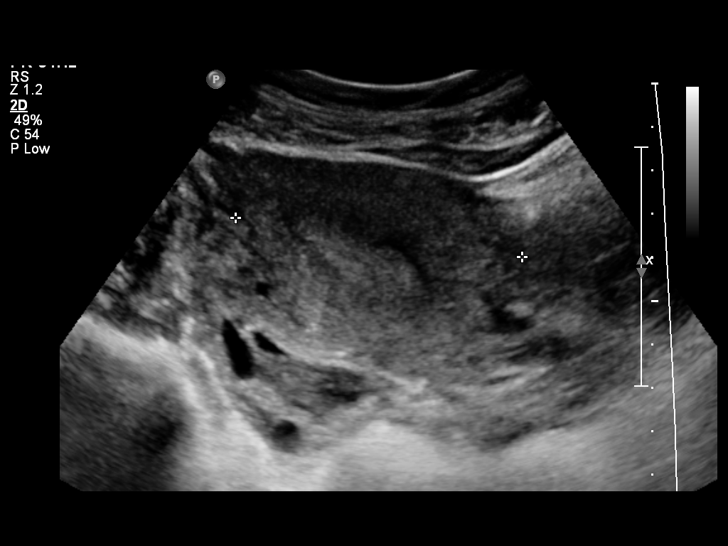
[im 23/91]
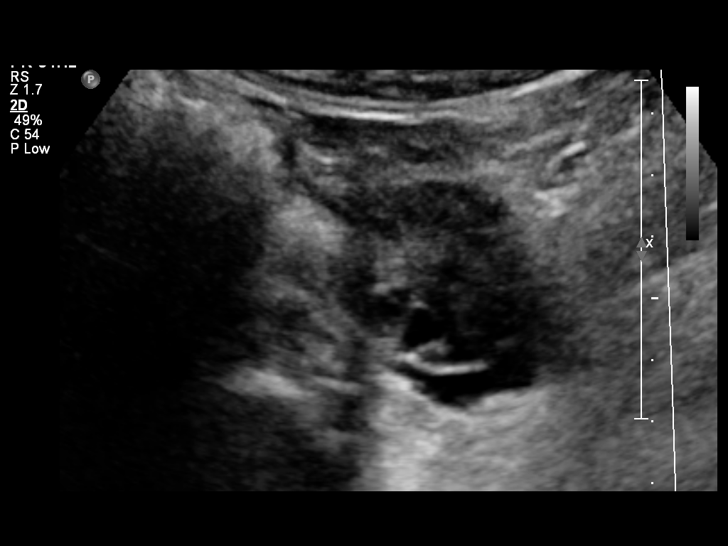
[im 31/91]
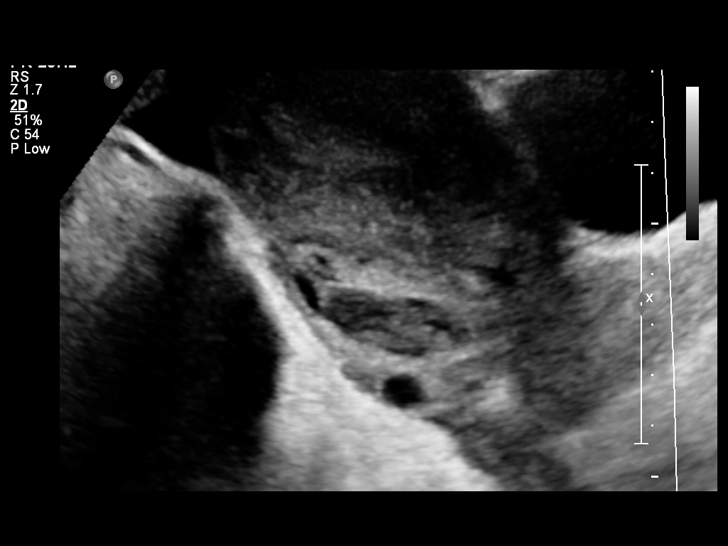
[im 38/91]
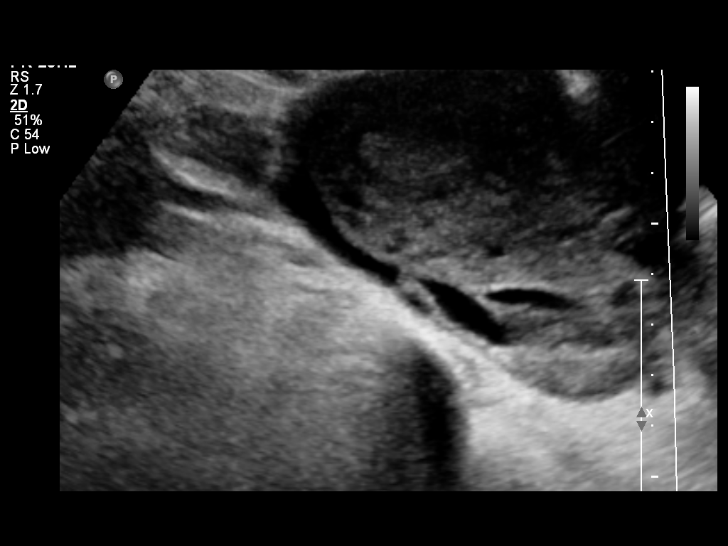
[im 46/91]
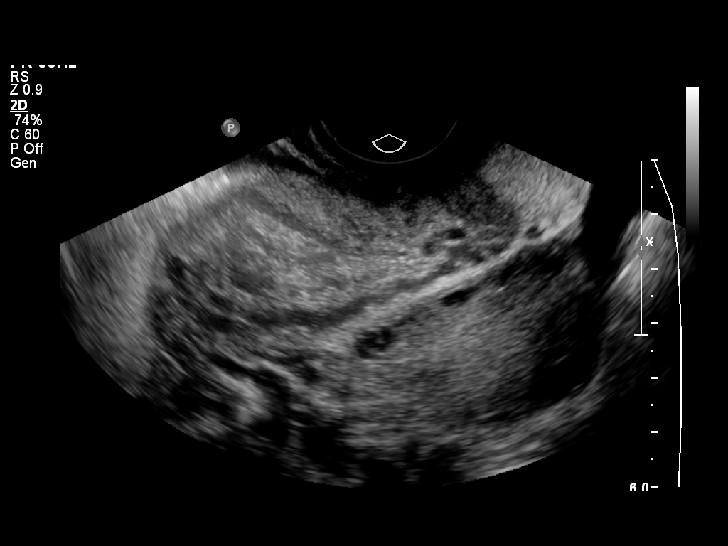
[im 53/91]
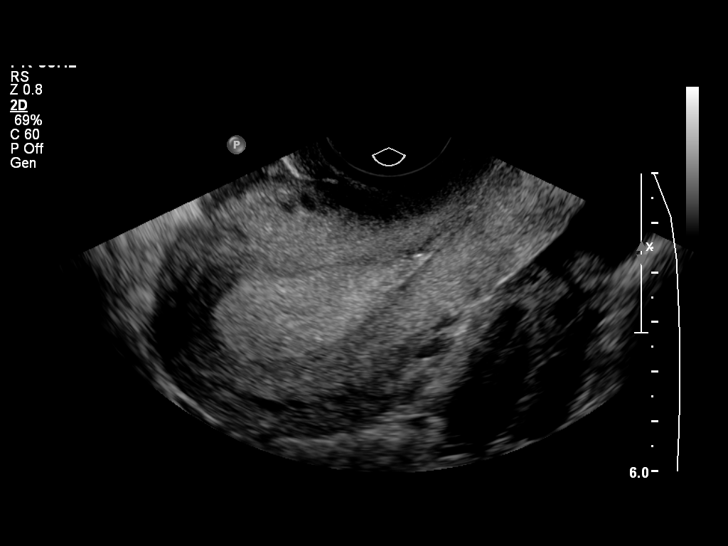
[im 61/91]
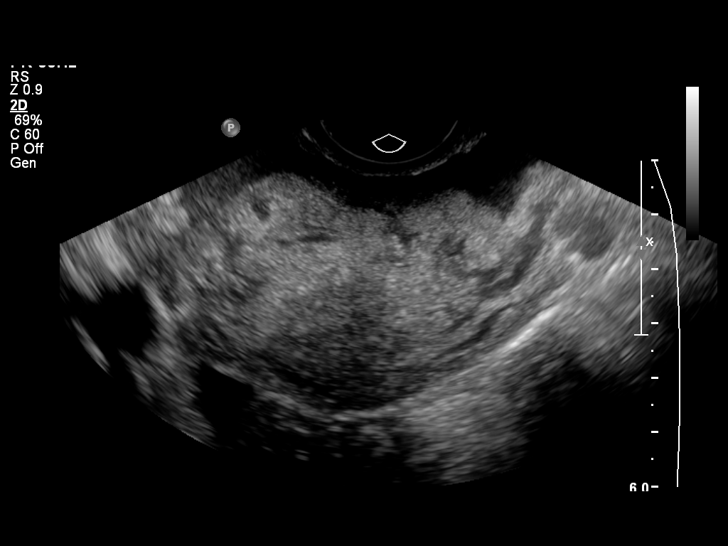
[im 68/91]
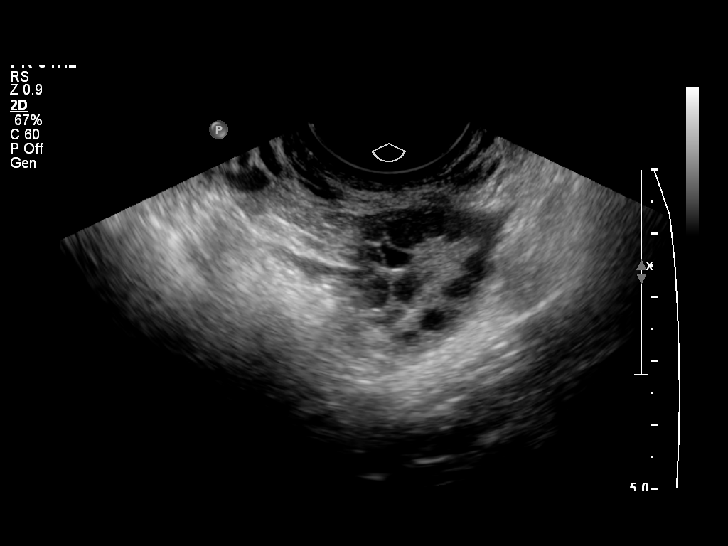
[im 76/91]
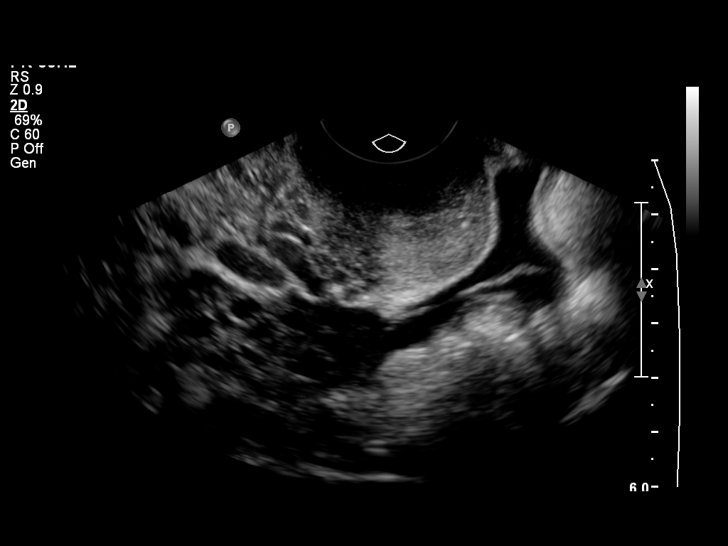
[im 83/91]
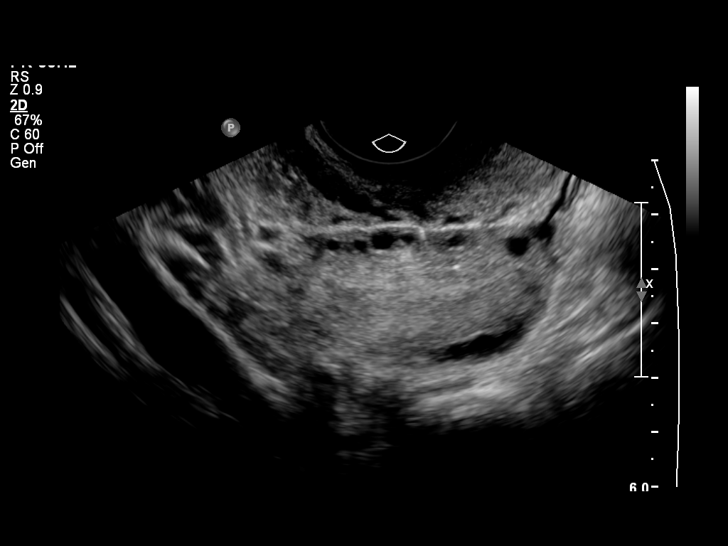
[im 91/91]
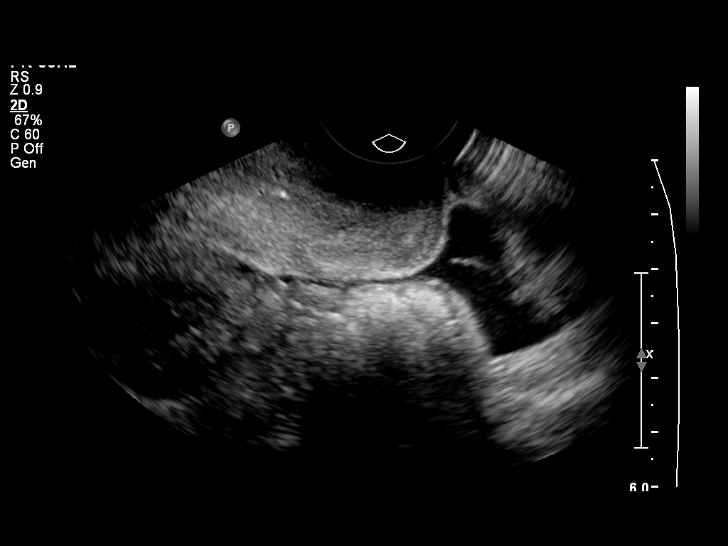

[13 of 25 positions shown; findings below may reference images not displayed]

It was necessary to proceed with endovaginal exam following the
transabdomnial exam to visualize the right ovary.
FINDINGS: Uterus: 91 mm x 53 mm x 59 mm, with normal myometrial echotexture.

Endometrium: 19 mm, thickened.No focal mass lesion.  Endometrial
thickening is homogeneous.

Right ovary:  51 mm x 32 mm x 58 mm.  Multiple follicles are
present.  Color-flow appears normal.

Left ovary: 34 mm x 24 mm x 30 mm.  Normal color flow.Physiologic
appearance with follicles.

Other findings: Small amount of free fluid is present in the pouch
of [place].  This is probably physiologic.
IMPRESSION: No acute abnormality.  Diffuse endometrial thickening, the clinical
significanceof which is unclear in a patient without vaginal
bleeding.  If dysfunctional uterine bleeding is present, consider
repeat examination on day seven of cycle.

## 2013-10-10 IMAGING — US US OB COMP LESS 14 WK
1 series · 13 of 28 positions shown · non-contrast
Comparison: none

CLINICAL DATA: Early pregnancy with beta HCG level of 57.
Abdominal pain.

OBSTETRIC <14 WK US AND TRANSVAGINAL OB US
TECHNIQUE: Both transabdominal and transvaginal ultrasound
examinations were performed for complete evaluation of the
gestation as well as the maternal uterus, adnexal regions, and
pelvic cul-de-sac.

[Series 1: us ob comp less 14 wk · 0.30mm/px · 13 of 36 slices shown]
[im 2/36]
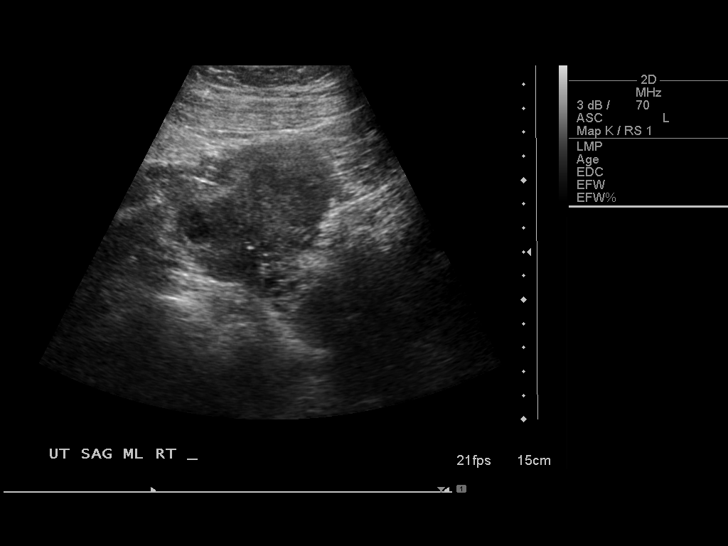
[im 4/36]
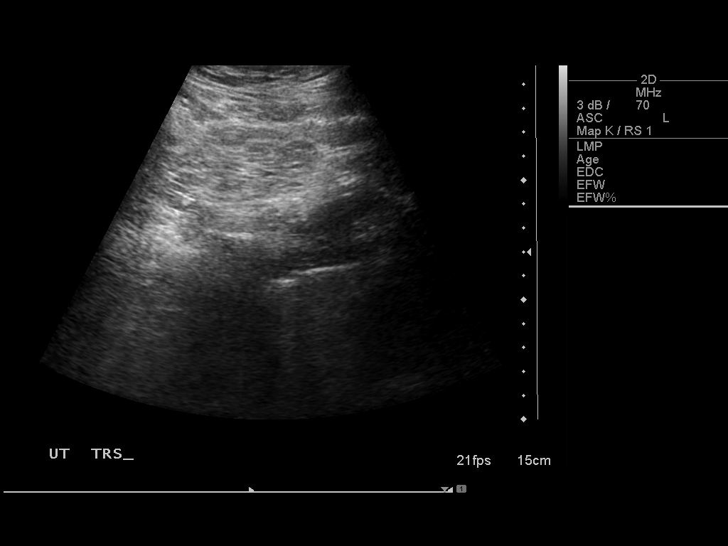
[im 7/36]
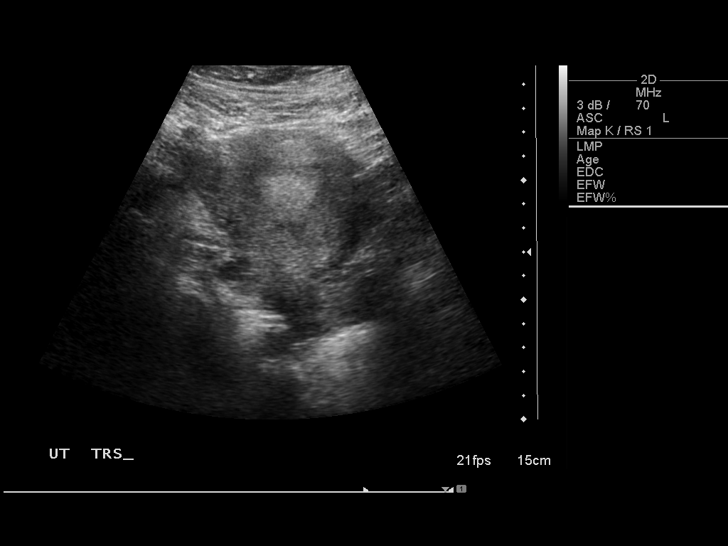
[im 10/36]
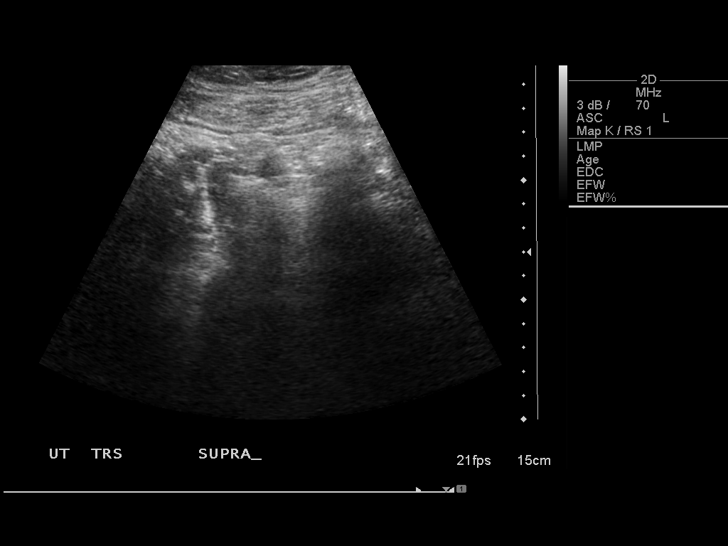
[im 12/36]
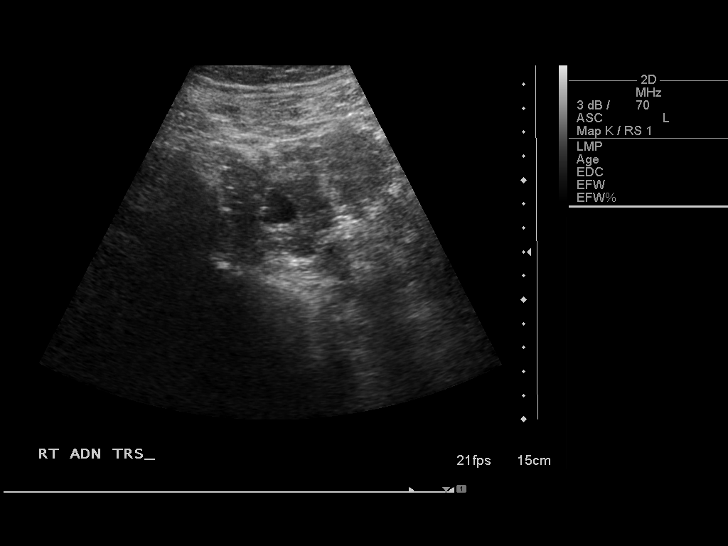
[im 15/36]
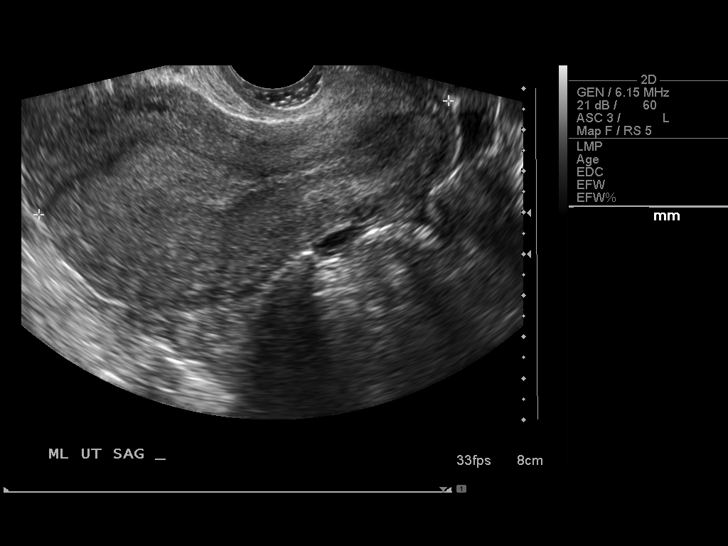
[im 19/36]
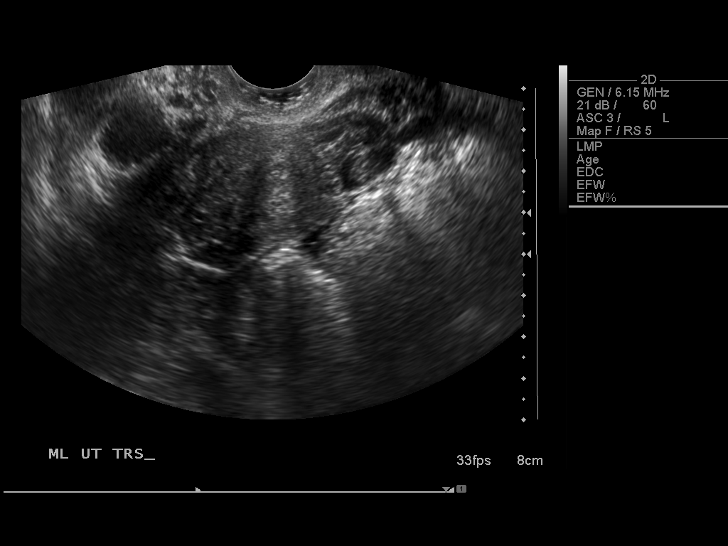
[im 21/36]
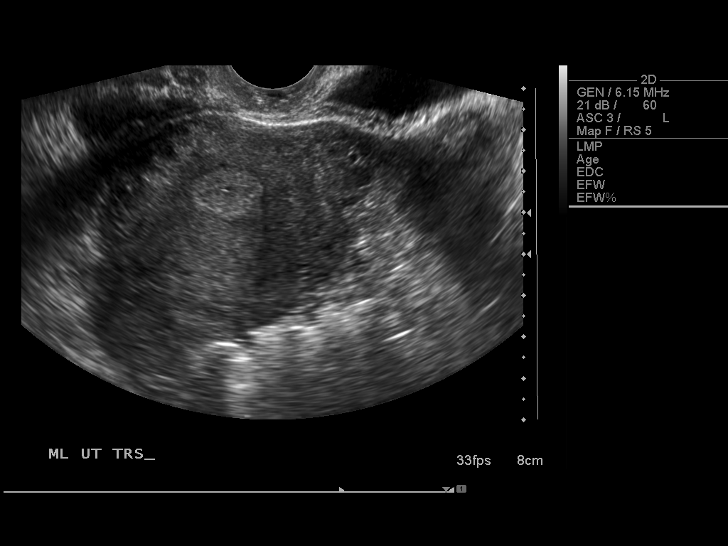
[im 24/36]
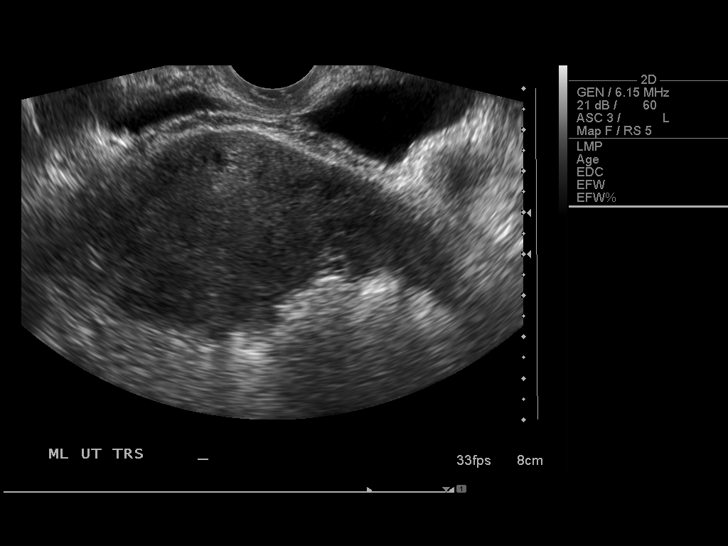
[im 26/36]
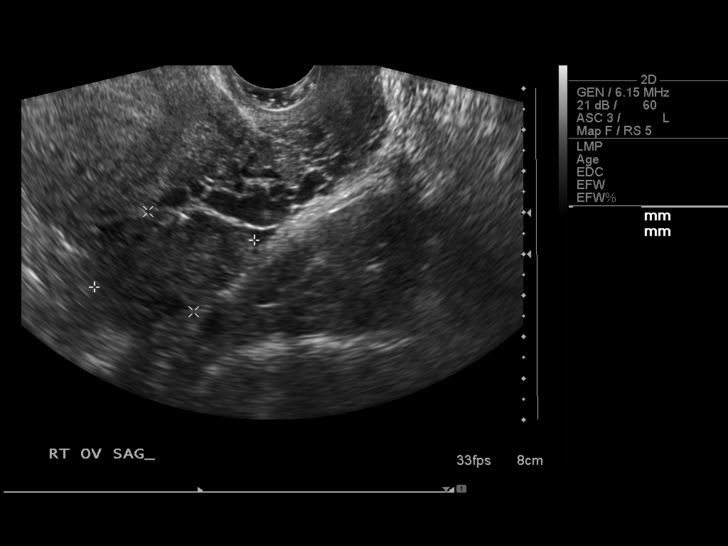
[im 29/36]
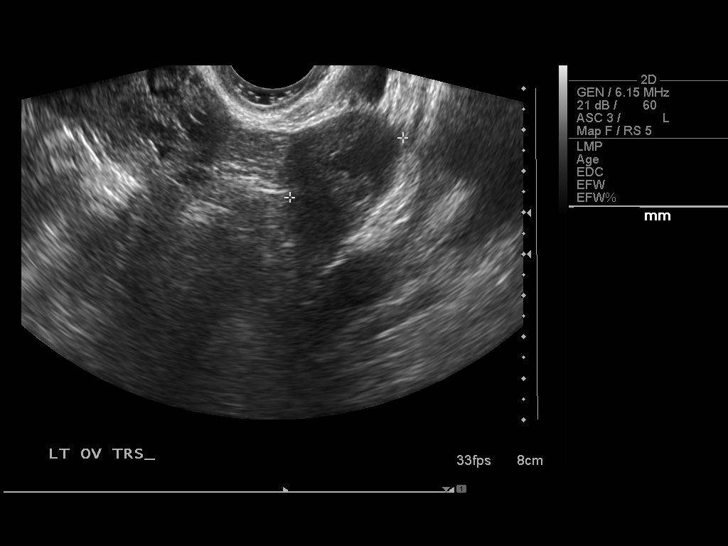
[im 32/36]
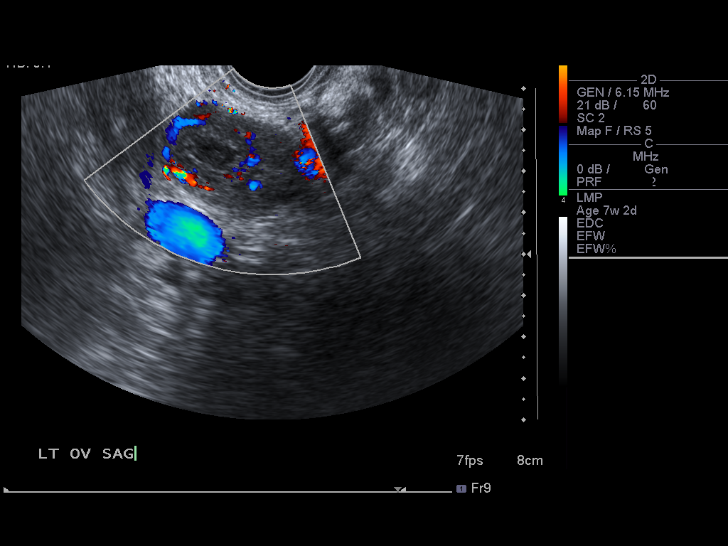
[im 34/36]
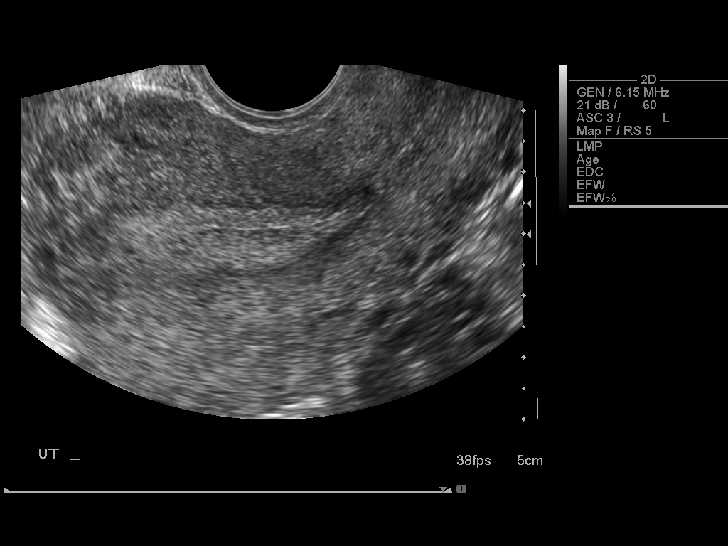

[13 of 28 positions shown; findings below may reference images not displayed]

FINDINGS: With transvaginal imaging, the endometrial stripe is
measured at 15 mm.  Uterine length is 10.2 cm.  A definite double
decidual reaction is not observed.  No intrauterine gestational sac
is visible.

The right ovary measures 4.0 x 2.7 x 3.4 cm and appears normal.

The left ovary measures 4.3 x 3.2 x 3.1 cm and contains a 1.5 x
cm complex cystic lesion probably representing a corpus luteum
centrally.

Trace free pelvic fluid is observed.
IMPRESSION: 1.  No intrauterine gestational sac is observed.  Given the
quantitative beta HCG level of 57, this is probably due to the very
early pregnancy.  Other possibilities include recent spontaneous
abortion, or occult ectopic pregnancy.  Correlation with
quantitative beta HCG trend going forward, and a low threshold for
reimaging are recommended.
2.  Trace free pelvic fluid, likely physiologic.

## 2014-03-06 ENCOUNTER — Encounter (HOSPITAL_COMMUNITY): Payer: Self-pay | Admitting: *Deleted

## 2014-03-06 ENCOUNTER — Inpatient Hospital Stay (HOSPITAL_COMMUNITY)
Admission: AD | Admit: 2014-03-06 | Discharge: 2014-03-06 | Disposition: A | Discharge status: Home / Self Care | Payer: Medicaid Other | Source: Ambulatory Visit | Attending: Obstetrics and Gynecology | Admitting: Obstetrics and Gynecology

## 2014-03-06 DIAGNOSIS — N390 Urinary tract infection, site not specified: Secondary | ICD-10-CM | POA: Insufficient documentation

## 2014-03-06 DIAGNOSIS — Z87891 Personal history of nicotine dependence: Secondary | ICD-10-CM | POA: Insufficient documentation

## 2014-03-06 DIAGNOSIS — R109 Unspecified abdominal pain: Secondary | ICD-10-CM | POA: Insufficient documentation

## 2014-03-06 HISTORY — DX: Unspecified abnormal cytological findings in specimens from vagina: R87.629

## 2014-03-06 HISTORY — DX: Unspecified infectious disease: B99.9

## 2014-03-06 LAB — URINALYSIS, ROUTINE W REFLEX MICROSCOPIC
Bilirubin Urine: NEGATIVE
GLUCOSE, UA: NEGATIVE mg/dL
KETONES UR: NEGATIVE mg/dL
NITRITE: NEGATIVE
PH: 7 (ref 5.0–8.0)
Protein, ur: NEGATIVE mg/dL
SPECIFIC GRAVITY, URINE: 1.02 (ref 1.005–1.030)
Urobilinogen, UA: 0.2 mg/dL (ref 0.0–1.0)

## 2014-03-06 LAB — URINE MICROSCOPIC-ADD ON

## 2014-03-06 LAB — WET PREP, GENITAL
TRICH WET PREP: NONE SEEN
YEAST WET PREP: NONE SEEN

## 2014-03-06 MED ORDER — SULFAMETHOXAZOLE-TMP DS 800-160 MG PO TABS: 1.0000 | ORAL_TABLET | Freq: Two times a day (BID) | ORAL | Status: DC

## 2014-03-06 NOTE — Discharge Instructions (Signed)
Sulfamethoxazole; Trimethoprim, SMX-TMP tablets  What is this medicine?  SULFAMETHOXAZOLE; TRIMETHOPRIM or SMX-TMP (suhl fuh meth OK suh zohl; trye METH oh prim) is a combination of a sulfonamide antibiotic and a second antibiotic, trimethoprim. It is used to treat or prevent certain kinds of bacterial infections. It will not work for colds, flu, or other viral infections.  This medicine may be used for other purposes; ask your health care provider or pharmacist if you have questions.  COMMON BRAND NAME(S): Bacter-Aid DS, Bactrim, Bactrim DS, Septra, Septra DS  What should I tell my health care provider before I take this medicine?  They need to know if you have any of these conditions:  -anemia  -asthma  -being treated with anticonvulsants  -if you frequently drink alcohol containing drinks  -kidney disease  -liver disease  -low level of folic acid or glucose-6-phosphate dehydrogenase  -poor nutrition or malabsorption  -porphyria  -severe allergies  -thyroid disorder  -an unusual or allergic reaction to sulfamethoxazole, trimethoprim, sulfa drugs, other medicines, foods, dyes, or preservatives  -pregnant or trying to get pregnant  -breast-feeding  How should I use this medicine?  Take this medicine by mouth with a full glass of water. Follow the directions on the prescription label. Take your medicine at regular intervals. Do not take it more often than directed. Do not skip doses or stop your medicine early.  Talk to your pediatrician regarding the use of this medicine in children. Special care may be needed. This medicine has been used in children as young as 2 months of age.  Overdosage: If you think you have taken too much of this medicine contact a poison control center or emergency room at once.  NOTE: This medicine is only for you. Do not share this medicine with others.  What if I miss a dose?  If you miss a dose, take it as soon as you can. If it is almost time for your next dose, take only that dose. Do  not take double or extra doses.  What may interact with this medicine?  Do not take this medicine with any of the following medications:  -aminobenzoate potassium  -dofetilide  -metronidazole  This medicine may also interact with the following medications:  -ACE inhibitors like benazepril, enalapril, lisinopril, and ramipril  -birth control pills  -cyclosporine  -digoxin  -diuretics  -indomethacin  -medicines for diabetes  -methenamine  -methotrexate  -phenytoin  -potassium supplements  -pyrimethamine  -sulfinpyrazone  -tricyclic antidepressants  -warfarin  This list may not describe all possible interactions. Give your health care provider a list of all the medicines, herbs, non-prescription drugs, or dietary supplements you use. Also tell them if you smoke, drink alcohol, or use illegal drugs. Some items may interact with your medicine.  What should I watch for while using this medicine?  Tell your doctor or health care professional if your symptoms do not improve. Drink several glasses of water a day to reduce the risk of kidney problems.  Do not treat diarrhea with over the counter products. Contact your doctor if you have diarrhea that lasts more than 2 days or if it is severe and watery.  This medicine can make you more sensitive to the sun. Keep out of the sun. If you cannot avoid being in the sun, wear protective clothing and use a sunscreen. Do not use sun lamps or tanning beds/booths.  What side effects may I notice from receiving this medicine?  Side effects that you should report to   your doctor or health care professional as soon as possible:  -allergic reactions like skin rash or hives, swelling of the face, lips, or tongue  -breathing problems  -fever or chills, sore throat  -irregular heartbeat, chest pain  -joint or muscle pain  -pain or difficulty passing urine  -red pinpoint spots on skin  -redness, blistering, peeling or loosening of the skin, including inside the mouth  -unusual bleeding or  bruising  -unusually weak or tired  -yellowing of the eyes or skin  Side effects that usually do not require medical attention (report to your doctor or health care professional if they continue or are bothersome):  -diarrhea  -dizziness  -headache  -loss of appetite  -nausea, vomiting  -nervousness  This list may not describe all possible side effects. Call your doctor for medical advice about side effects. You may report side effects to FDA at 1-800-FDA-1088.  Where should I keep my medicine?  Keep out of the reach of children.  Store at room temperature between 20 to 25 degrees C (68 to 77 degrees F). Protect from light. Throw away any unused medicine after the expiration date.  NOTE: This sheet is a summary. It may not cover all possible information. If you have questions about this medicine, talk to your doctor, pharmacist, or health care provider.   2015, [employer]. (2013-03-14 14:38:26)  Urinary Tract Infection  Urinary tract infections (UTIs) can develop anywhere along your urinary tract. Your urinary tract is your body's drainage system for removing wastes and extra water. Your urinary tract includes two kidneys, two ureters, a bladder, and a urethra. Your kidneys are a pair of bean-shaped organs. Each kidney is about the size of your fist. They are located below your ribs, one on each side of your spine.  CAUSES  Infections are caused by microbes, which are microscopic organisms, including fungi, viruses, and bacteria. These organisms are so small that they can only be seen through a microscope. Bacteria are the microbes that most commonly cause UTIs.  SYMPTOMS   Symptoms of UTIs may vary by age and gender of the patient and by the location of the infection. Symptoms in young women typically include a frequent and intense urge to urinate and a painful, burning feeling in the bladder or urethra during urination. Older women and men are more likely to be tired, shaky, and weak and have muscle  aches and abdominal pain. A fever may mean the infection is in your kidneys. Other symptoms of a kidney infection include pain in your back or sides below the ribs, nausea, and vomiting.  DIAGNOSIS  To diagnose a UTI, your caregiver will ask you about your symptoms. Your caregiver also will ask to provide a urine sample. The urine sample will be tested for bacteria and white blood cells. White blood cells are made by your body to help fight infection.  TREATMENT   Typically, UTIs can be treated with medication. Because most UTIs are caused by a bacterial infection, they usually can be treated with the use of antibiotics. The choice of antibiotic and length of treatment depend on your symptoms and the type of bacteria causing your infection.  HOME CARE INSTRUCTIONS   If you were prescribed antibiotics, take them exactly as your caregiver instructs you. Finish the medication even if you feel better after you have only taken some of the medication.   Drink enough water and fluids to keep your urine clear or pale yellow.   Avoid caffeine, tea,   and carbonated beverages. They tend to irritate your bladder.   Empty your bladder often. Avoid holding urine for long periods of time.   Empty your bladder before and after sexual intercourse.   After a bowel movement, women should cleanse from front to back. Use each tissue only once.  SEEK MEDICAL CARE IF:    You have back pain.   You develop a fever.   Your symptoms do not begin to resolve within 3 days.  SEEK IMMEDIATE MEDICAL CARE IF:    You have severe back pain or lower abdominal pain.   You develop chills.   You have nausea or vomiting.   You have continued burning or discomfort with urination.  MAKE SURE YOU:    Understand these instructions.   Will watch your condition.   Will get help right away if you are not doing well or get worse.  Document Released: 05/17/2005 Document Revised: 02/06/2012 Document Reviewed: 09/15/2011  [employer] Patient Information  2015 [employer]. This information is not intended to replace advice given to you by your health care provider. Make sure you discuss any questions you have with your health care provider.

## 2014-03-06 NOTE — MAU Note (Addendum)
Pain in lower abd/pelvis started last night.  Pain is constant.  Having frequency, urgency and pain with urination

## 2014-03-06 NOTE — MAU Provider Note (Signed)
History   28yo G3 P3 GYN Pt presents to MAU with lower abd pain, mid back pain w/frequent urination since last night.  She denies being pregnant.  She states he has regular cycles and her LMP was February 07, 2014.  She reports "having a gallon of cranberry but it did not help"  Patient Active Problem List   Diagnosis Date Noted  . Vaginal delivery 08/04/2012  . Bilateral kidney stones 07/08/2012  . Abnormal glucose tolerance test in pregnancy, antepartum 07/08/2012  . Unsure of LMP  07/08/2012  . Normal pregnancy 01/17/2012  . Abnormal Pap smear   . Polycystic ovarian syndrome   . CIN I (cervical intraepithelial neoplasia I)   . Herpes simplex without mention of complication   . FHx: suicide   . HSV (herpes simplex virus) anogenital infection 12/02/2011  . High risk HPV infection 11/28/2011  . History of PCOS 11/28/2011  . Cervical intraepithelial neoplasia I 11/28/2011  . Lower abdominal pain 02/28/2011    Class: Acute    Chief Complaint  Patient presents with  . Pelvic Pain   HPI  OB History   Grav Para Term Preterm Abortions TAB SAB Ect Mult Living   3 3 3       3       Past Medical History  Diagnosis Date  . Abnormal Pap smear   . Polycystic ovarian syndrome 05/08/05  . 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 CIN I (cervical intraepithelial neoplasia I)   . Herpes simplex without mention of complication   . FHx: suicide   . H/O amenorrhea 02/19/04  . Vaginitis and vulvovaginitis 04/29/04  . Bacterial vaginosis 04/29/04  . Dysuria 04/04/05  . Irregular periods/menstrual cycles   . Hx: UTI (urinary tract infection)   . Dyspareunia 05/02/10  . Herpes simplex type 2 infection     "last outbreak 3 weeks ago"  . Infection     UTI  . Chronic kidney disease     kidney stones  . Vaginal Pap smear, abnormal     Past Surgical History  Procedure Laterality Date  . Cholecystectomy, laparoscopic  2010  . Wisdom tooth extraction  2001  . Tonsillectomy  2006  . Cystoscopy with  ureteroscopy  03/28/2012    Procedure: CYSTOSCOPY WITH URETEROSCOPY;  Surgeon: Marcine Matar, MD;  Location: [facility];  Service: Urology;  Laterality: Right;  RIGHT URETEROSCOPIC STONE EXTRACTION W/ LASER (Pt is [redacted] wk pregnant)    . Stone extraction with basket  03/28/2012    Procedure: STONE EXTRACTION WITH BASKET;  Surgeon: Marcine Matar, MD;  Location: [facility];  Service: Urology;  Laterality: Right;  . Tubal ligation  08/04/2012    Procedure: POST PARTUM TUBAL LIGATION;  Surgeon: Hal Morales, MD;  Location: [facility];  Service: Gynecology;  Laterality: Bilateral;  Bilateral post partum tubal ligation    Family History  Problem Relation Age of Onset  . Heart disease Father     heart attack in oct.  lm  . Cancer Maternal Uncle     bone cancer  . Diabetes Maternal Uncle   . Cancer Maternal Grandmother     ovarian cancer  . Suicidality Mother   . Diabetes Maternal Aunt   . Cancer Maternal Aunt     lung  . Hypertension Paternal Uncle     History  Substance Use Topics  . Smoking status: Former Games developer  . Smokeless tobacco: Never Used     Comment: Jan 2015  . Alcohol Use:  No    Allergies:  Allergies  Allergen Reactions  . Amoxicillin Rash    No prescriptions prior to admission    Review of Systems  Constitutional: Negative for fever and chills.  Genitourinary: Positive for dysuria, urgency and frequency. Negative for hematuria and flank pain.    Physical Exam   Blood pressure 123/90, pulse 66, temperature 97.8 F (36.6 C), temperature source Oral, resp. rate 18, height 5' 7.5" (1.715 m), weight 191 lb (86.637 kg), last menstrual period 03/04/2014, currently breastfeeding.  Physical Exam  Constitutional: She is oriented to person, place, and time. She appears well-developed.  GI: She exhibits no distension and no mass. There is no tenderness. There is no rebound and no guarding.  Genitourinary: Vagina normal.  Suprapubic pain per JO PE  Musculoskeletal: Normal  range of motion.  Neurological: She is alert and oriented to person, place, and time.  Skin: Skin is warm and dry.       HPI ROS  Physical Exam  Constitutional: She is oriented to person, place, and time. She appears well-developed.  GI: She exhibits no distension and no mass. There is no tenderness. There is no rebound and no guarding.  Genitourinary: Vagina normal.  Suprapubic pain per JO PE  Musculoskeletal: Normal range of motion.  Neurological: She is alert and oriented to person, place, and time.  Skin: Skin is warm and dry.     ED Course  Assessment: 28yo GYN Pt  Presumptive UTI  UA negative Wetprep negative GC CL pending Urine culture sent   Plan: Dc to home with UTI and infection precautions Rx Bactrim 160mg  PO q bid x7 days Will if urine culture is positive FU as needed  Tyaira Heward, CNM, MSN 03/06/2014. 10:16 PM

## 2014-03-07 LAB — GC/CHLAMYDIA PROBE AMP
CT PROBE, AMP APTIMA: NEGATIVE
GC PROBE AMP APTIMA: NEGATIVE

## 2014-03-09 LAB — URINE CULTURE: Colony Count: >=100000

## 2014-04-18 ENCOUNTER — Encounter (HOSPITAL_COMMUNITY): Payer: Self-pay | Admitting: *Deleted

## 2014-04-18 ENCOUNTER — Inpatient Hospital Stay (HOSPITAL_COMMUNITY)
Admission: AD | Admit: 2014-04-18 | Discharge: 2014-04-18 | Disposition: A | Discharge status: Home / Self Care | Payer: Medicaid Other | Source: Ambulatory Visit | Attending: Obstetrics & Gynecology | Admitting: Obstetrics & Gynecology

## 2014-04-18 DIAGNOSIS — Z87891 Personal history of nicotine dependence: Secondary | ICD-10-CM | POA: Insufficient documentation

## 2014-04-18 DIAGNOSIS — N390 Urinary tract infection, site not specified: Secondary | ICD-10-CM

## 2014-04-18 DIAGNOSIS — Z880 Allergy status to penicillin: Secondary | ICD-10-CM | POA: Insufficient documentation

## 2014-04-18 LAB — URINALYSIS, ROUTINE W REFLEX MICROSCOPIC
GLUCOSE, UA: 250 mg/dL — AB
KETONES UR: 15 mg/dL — AB
Nitrite: POSITIVE — AB
PH: 7 (ref 5.0–8.0)
Specific Gravity, Urine: 1.015 (ref 1.005–1.030)
Urobilinogen, UA: >8 mg/dL — ABNORMAL HIGH (ref 0.0–1.0)

## 2014-04-18 LAB — URINE MICROSCOPIC-ADD ON

## 2014-04-18 LAB — POCT PREGNANCY, URINE: Preg Test, Ur: NEGATIVE

## 2014-04-18 MED ORDER — CEPHALEXIN 500 MG PO CAPS
500.0000 mg | ORAL_CAPSULE | Freq: Two times a day (BID) | ORAL | Status: DC
Start: 2014-04-18 — End: 2015-03-03

## 2014-04-18 NOTE — Discharge Instructions (Signed)

## 2014-04-18 NOTE — MAU Note (Signed)
C/o UTI symptoms since 0100 this AM;

## 2014-04-18 NOTE — MAU Provider Note (Signed)
[name] Bass is a non pregnant 28 y.o. G3P3 that presents to MAU c/o UTI symptoms.  Records indicated she has a UTI in July and January 2015.  In July pt did not complete her course of abx correctly. Today she denies a vaginal discharge or bleeding.      History     Patient Active Problem List   Diagnosis Date Noted  . Vaginal delivery 08/04/2012  . Bilateral kidney stones 07/08/2012  . Abnormal glucose tolerance test in pregnancy, antepartum 07/08/2012  . Unsure of LMP  07/08/2012  . Normal pregnancy 01/17/2012  . Abnormal Pap smear   . Polycystic ovarian syndrome   . CIN I (cervical intraepithelial neoplasia I)   . Herpes simplex without mention of complication   . FHx: suicide   . HSV (herpes simplex virus) anogenital infection 12/02/2011  . High risk HPV infection 11/28/2011  . History of PCOS 11/28/2011  . Cervical intraepithelial neoplasia I 11/28/2011  . Lower abdominal pain 02/28/2011    Class: Acute    Chief Complaint  Patient presents with  . Urinary Tract Infection   HPI  OB History   Grav Para Term Preterm Abortions TAB SAB Ect Mult Living   Past Medical History  Diagnosis Date  . Abnormal Pap smear   . Polycystic ovarian syndrome 05/08/05  . 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 CIN I (cervical intraepithelial neoplasia I)   . Herpes simplex without mention of complication   . FHx: suicide   . H/O amenorrhea 02/19/04  . Vaginitis and vulvovaginitis 04/29/04  . Bacterial vaginosis 04/29/04  . Dysuria 04/04/05  . Irregular periods/menstrual cycles   . Hx: UTI (urinary tract infection)   . Dyspareunia 05/02/10  . Herpes simplex type 2 infection     "last outbreak 3 weeks ago"  . Infection     UTI  . Chronic kidney disease     kidney stones  . Vaginal Pap smear, abnormal     Past Surgical History  Procedure Laterality Date  . Cholecystectomy, laparoscopic  2010  . Wisdom tooth extraction  2001  . Tonsillectomy  2006  .  Cystoscopy with ureteroscopy  03/28/2012    Procedure: CYSTOSCOPY WITH URETEROSCOPY;  Surgeon: Marcine Matar, MD;  Location: [facility];  Service: Urology;  Laterality: Right;  RIGHT URETEROSCOPIC STONE EXTRACTION W/ LASER (Pt is [redacted] wk pregnant)    . Stone extraction with basket  03/28/2012    Procedure: STONE EXTRACTION WITH BASKET;  Surgeon: Marcine Matar, MD;  Location: [facility];  Service: Urology;  Laterality: Right;  . Tubal ligation  08/04/2012    Procedure: POST PARTUM TUBAL LIGATION;  Surgeon: Hal Morales, MD;  Location: [facility];  Service: Gynecology;  Laterality: Bilateral;  Bilateral post partum tubal ligation    Family History  Problem Relation Age of Onset  . Heart disease Father     heart attack in oct.  lm  . Cancer Maternal Uncle     bone cancer  . Diabetes Maternal Uncle   . Cancer Maternal Grandmother     ovarian cancer  . Suicidality Mother   . Diabetes Maternal Aunt   . Cancer Maternal Aunt     lung  . Hypertension Paternal Uncle     History  Substance Use Topics  . SmYANETH FAIRBAIRNGames developer  . Smokeless tobacco: Never Used     Comment: Jan 2015  .  Alcohol Use: No    Allergies:  Allergies  Allergen Reactions  . Amoxicillin Rash    Prescriptions prior to admission  Medication Sig Dispense Refill  . ibuprofen (ADVIL,MOTRIN) 200 MG tablet Take 600 mg by mouth every 4 (four) hours as needed for mild pain or moderate pain.      Marland Kitchen Phenazopyridine HCl (AZO URINARY PAIN PO) Take 2 tablets by mouth once.        ROS See HPI above, all other systems are negative  Physical Exam   Blood pressure 121/66, pulse 78, temperature 97.9 F (36.6 C), temperature source Oral, resp. rate 16, height  (1.778 m), weight 185 lb (83.915 kg), currently breastfeeding. Marland Kitchen  Results for orders placed during the hospital encounter of 04/18/14 (from the past 24 hour(s))  URINALYSIS, ROUTINE W REFLEX MICROSCOPIC     Status: Abnormal   Collection Time    04/18/14  12:05 PM      Result Value Ref Range   Color, Urine AMBER (*) YELLOW   APPearance HAZY (*) CLEAR   Specific Gravity, Urine 1.015  1.005 - 1.030   pH 7.0  5.0 - 8.0   Glucose, UA 250 (*) NEGATIVE mg/dL   Hgb urine dipstick SMALL (*) NEGATIVE   Bilirubin Urine SMALL (*) NEGATIVE   Ketones, ur 15 (*) NEGATIVE mg/dL   Protein, ur >960 (*) NEGATIVE mg/dL   Urobilinogen, UA >4.5 (*) 0.0 - 1.0 mg/dL   Nitrite POSITIVE (*) NEGATIVE   Leukocytes, UA MODERATE (*) NEGATIVE  URINE MICROSCOPIC-ADD ON     Status: Abnormal   Collection Time    04/18/14 12:05 PM      Result Value Ref Range   Squamous Epithelial / LPF FEW (*) RARE   WBC, UA TOO NUMEROUS TO COUNT  <3 WBC/hpf   RBC / HPF 3-6  <3 RBC/hpf   Bacteria, UA RARE  RARE   Urine-Other MUCOUS PRESENT    POCT PREGNANCY, URINE     Status: None   Collection Time    04/18/14 12:27 PM      Result Value Ref Range   Preg Test, Ur NEGATIVE  NEGATIVE    Physical Exam Ext:  WNL ABD: Soft, non tender to palpation, no rebound or guarding Right flank plain  ED Course  Assessment: UA UTI   Plan: Keflex  bid x7 Referral to Urology for multiple UTI's DC to home Pt encouraged to complete the course of abx as ordered FU in office   Lot Medford, CNM, MSN 04/18/2014. 1:35 PM

## 2014-05-25 IMAGING — US US RENAL
1 series · 14 of 25 positions shown · non-contrast
Comparison: 03/28/2011 CT

CLINICAL DATA: Left flank pain, kidney stone history.

RENAL/URINARY TRACT ULTRASOUND COMPLETE

[Series 1: us renal · 14 of 72 slices shown]
[im 1/72]
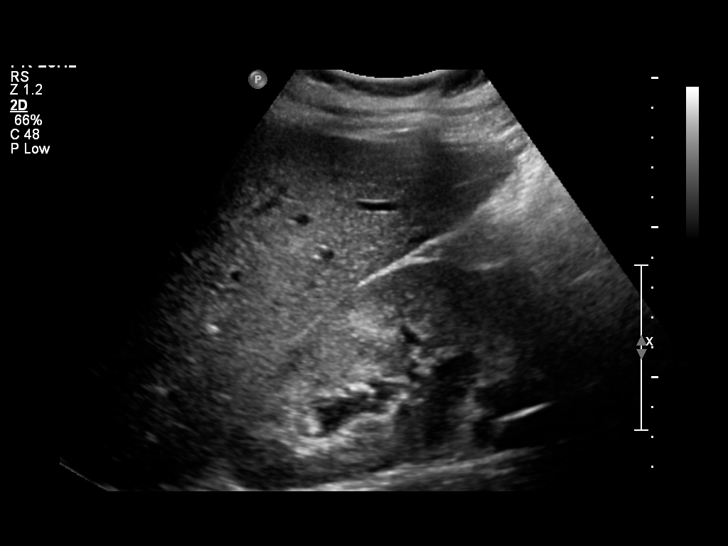
[im 6/72]
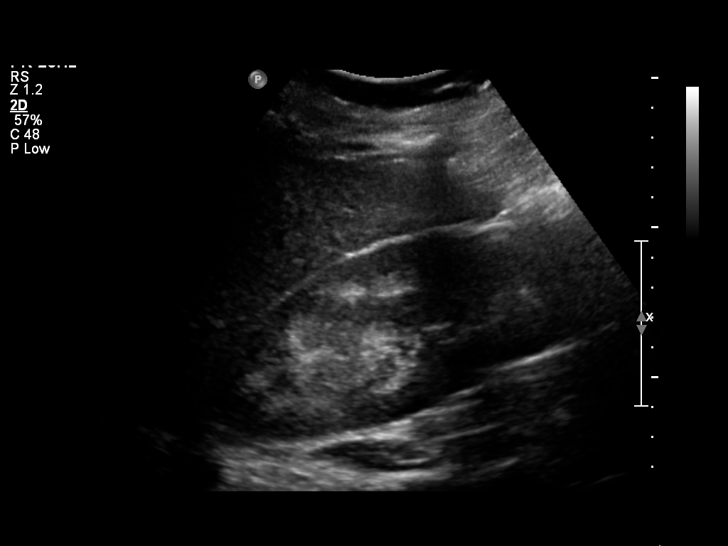
[im 12/72]
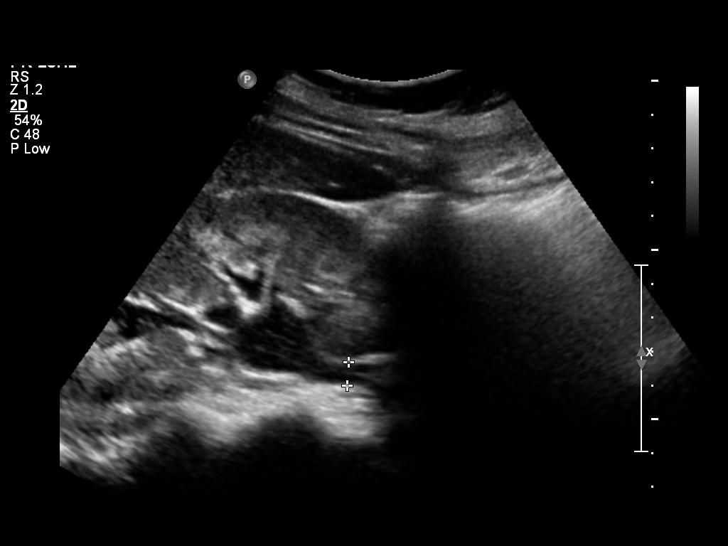
[im 18/72]
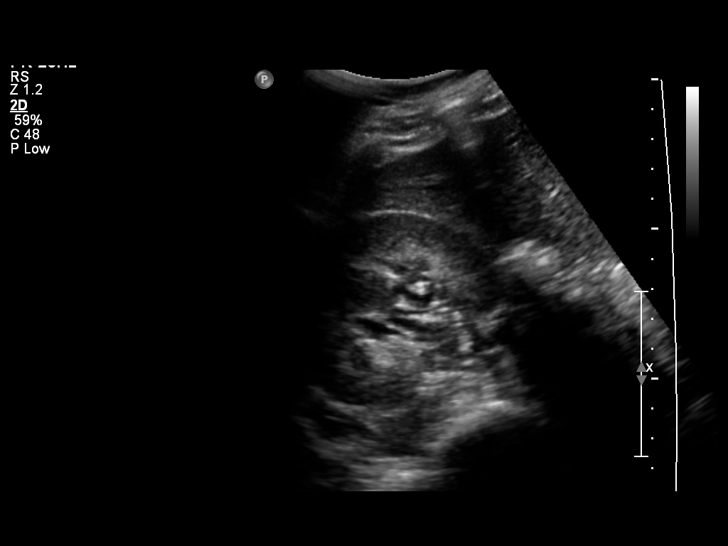
[im 24/72]
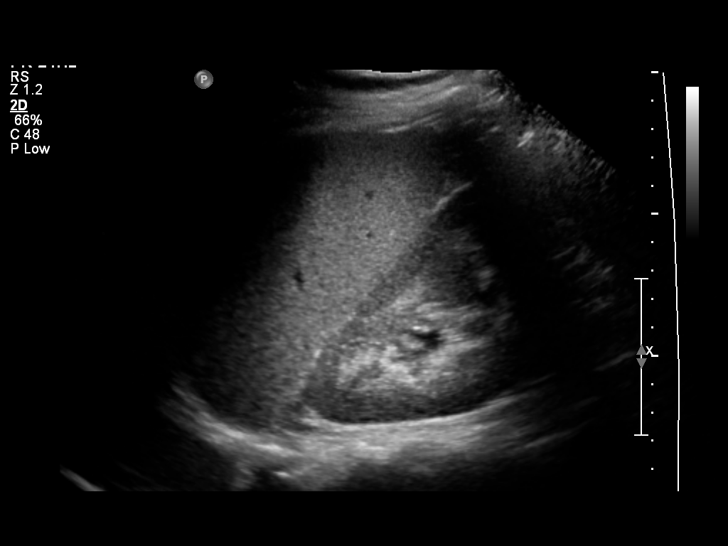
[im 27/72]
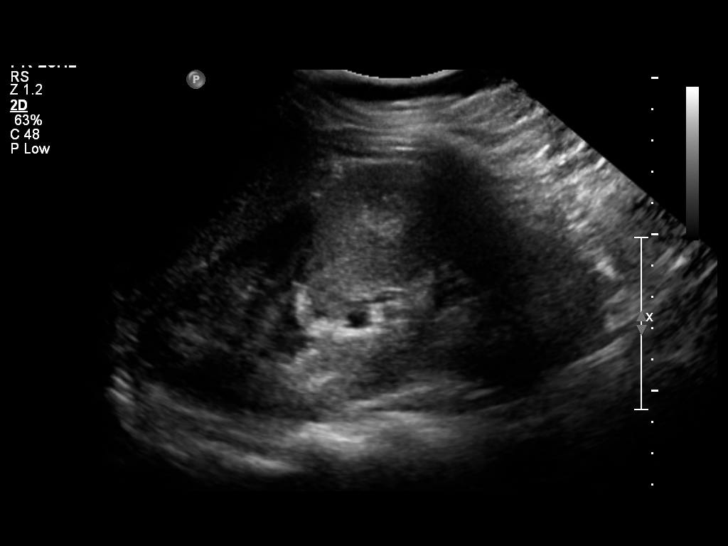
[im 33/72]
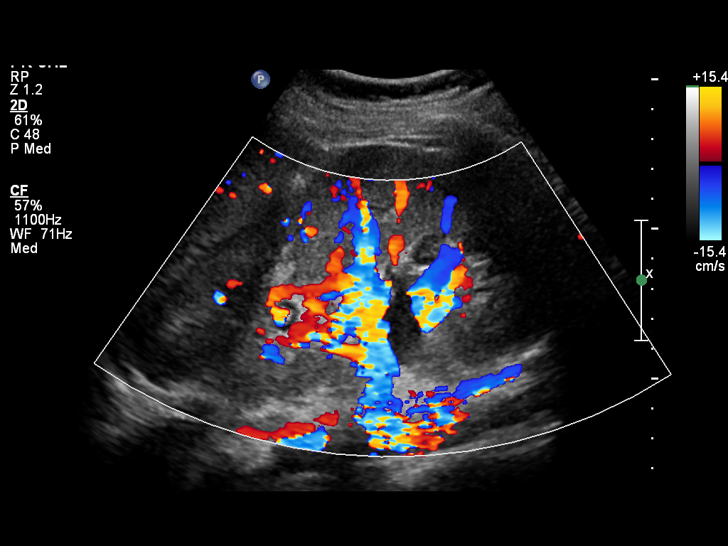
[im 39/72]
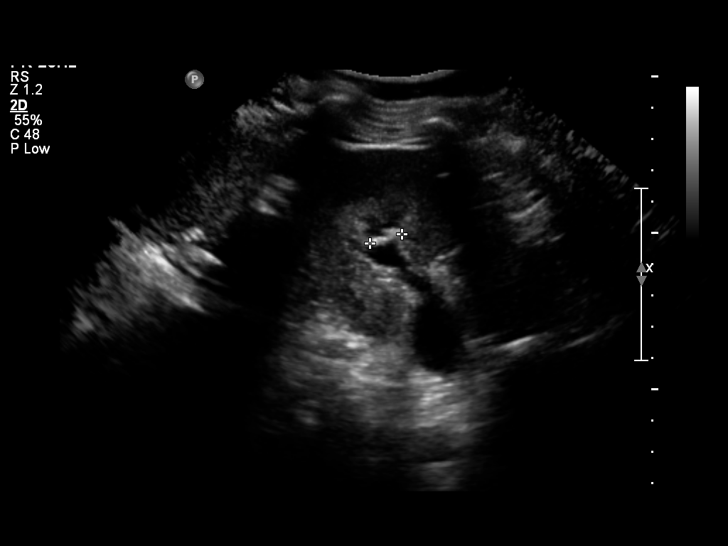
[im 45/72]
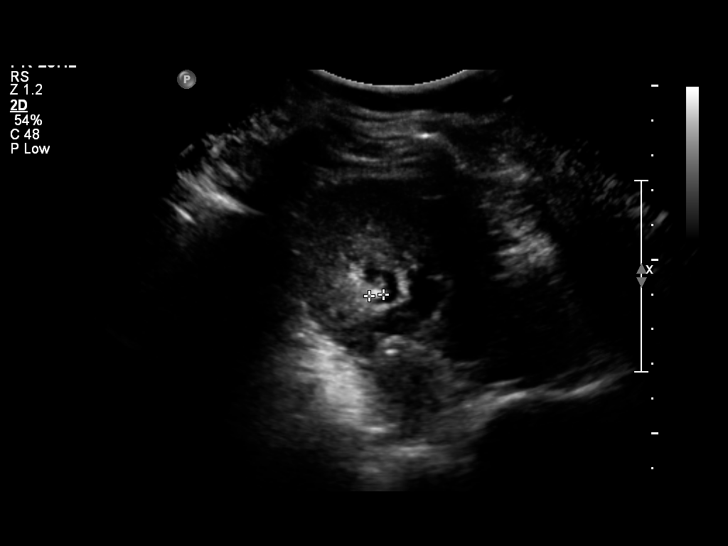
[im 48/72]
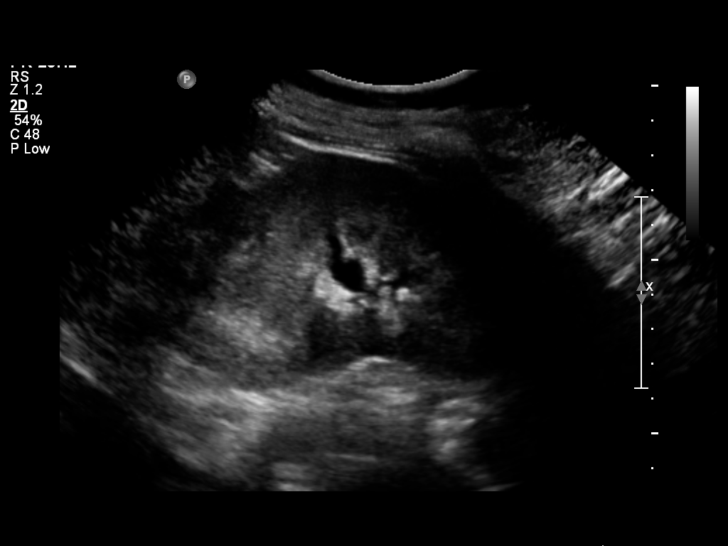
[im 54/72]
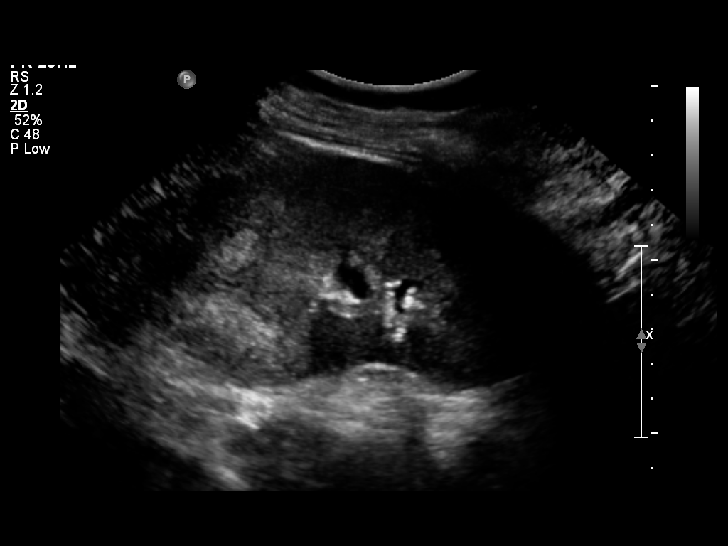
[im 60/72]
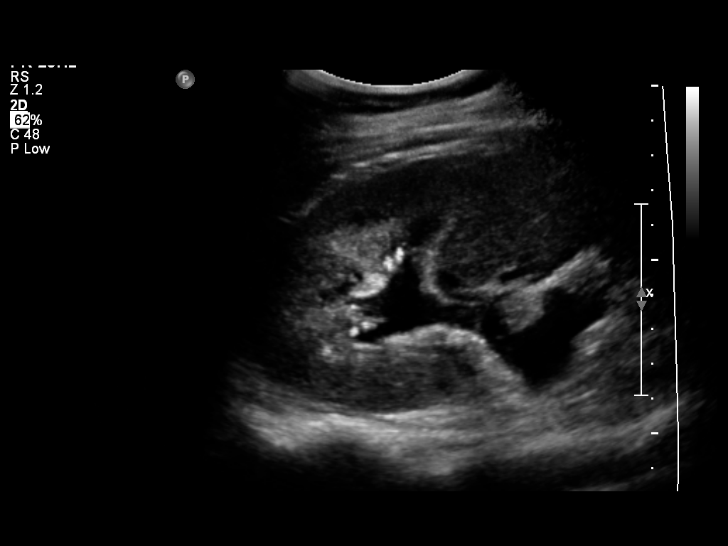
[im 66/72]
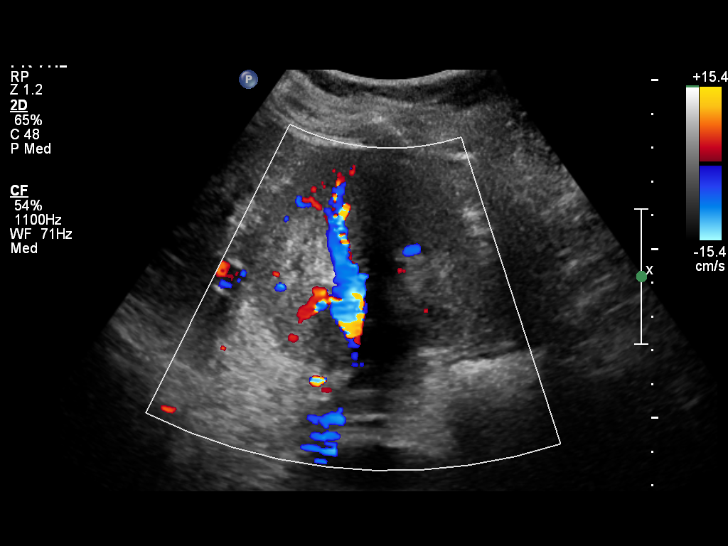
[im 72/72]
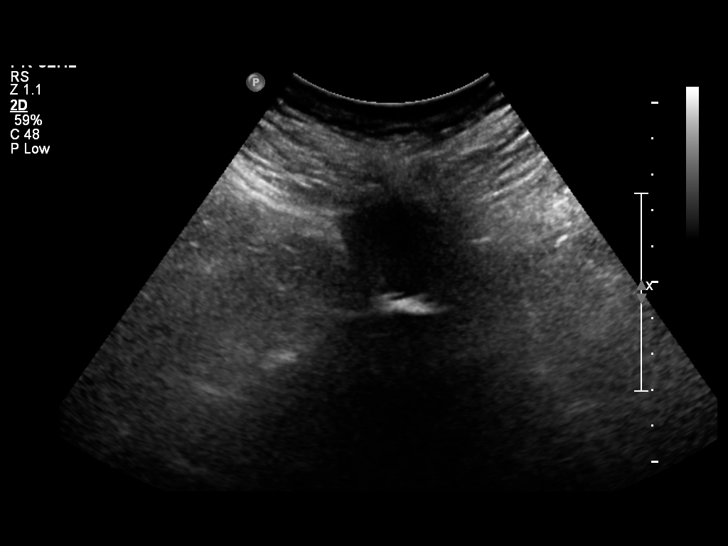

[14 of 25 positions shown; findings below may reference images not displayed]

FINDINGS: Right Kidney:  Mild hydronephrosis.  Nonobstructing 4 mm interpolar
stone.  Normal echogenicity. The kidney measures 15 cm in length.

Left Kidney:  Mild to moderate hydronephrosis.  Multiple tiny
nonobstructing stones, measuring up to 6 mm.  The kidney measures
15.1 cm in length.

Bladder:  Decompressed.  This limits evaluation.
IMPRESSION: Bilateral hydronephrosis, more pronounced on the left.  There are
bilateral nonobstructing renal stones.  A left ureteral stone is
not excluded.

The bladder was decompressed at the time of examination, limiting
evaluation for a UVJ stone and for ureteral jets. Consider
reevaluation with a distended bladder.

## 2014-06-07 ENCOUNTER — Emergency Department (HOSPITAL_COMMUNITY)
Admission: EM | Admit: 2014-06-07 | Discharge: 2014-06-07 | Disposition: A | Discharge status: Home / Self Care | Payer: Worker's Compensation | Attending: Emergency Medicine | Admitting: Emergency Medicine

## 2014-06-07 ENCOUNTER — Emergency Department (HOSPITAL_COMMUNITY): Payer: Worker's Compensation

## 2014-06-07 ENCOUNTER — Encounter (HOSPITAL_COMMUNITY): Payer: Self-pay | Admitting: Emergency Medicine

## 2014-06-07 DIAGNOSIS — Z87891 Personal history of nicotine dependence: Secondary | ICD-10-CM | POA: Diagnosis not present

## 2014-06-07 DIAGNOSIS — Z87440 Personal history of urinary (tract) infections: Secondary | ICD-10-CM | POA: Diagnosis not present

## 2014-06-07 DIAGNOSIS — Z8639 Personal history of other endocrine, nutritional and metabolic disease: Secondary | ICD-10-CM | POA: Diagnosis not present

## 2014-06-07 DIAGNOSIS — Y9389 Activity, other specified: Secondary | ICD-10-CM | POA: Diagnosis not present

## 2014-06-07 DIAGNOSIS — Z8742 Personal history of other diseases of the female genital tract: Secondary | ICD-10-CM | POA: Diagnosis not present

## 2014-06-07 DIAGNOSIS — Z880 Allergy status to penicillin: Secondary | ICD-10-CM | POA: Insufficient documentation

## 2014-06-07 DIAGNOSIS — S92531A Displaced fracture of distal phalanx of right lesser toe(s), initial encounter for closed fracture: Secondary | ICD-10-CM | POA: Insufficient documentation

## 2014-06-07 DIAGNOSIS — Z8619 Personal history of other infectious and parasitic diseases: Secondary | ICD-10-CM | POA: Insufficient documentation

## 2014-06-07 DIAGNOSIS — Z792 Long term (current) use of antibiotics: Secondary | ICD-10-CM | POA: Insufficient documentation

## 2014-06-07 DIAGNOSIS — N189 Chronic kidney disease, unspecified: Secondary | ICD-10-CM | POA: Insufficient documentation

## 2014-06-07 DIAGNOSIS — Y9289 Other specified places as the place of occurrence of the external cause: Secondary | ICD-10-CM | POA: Insufficient documentation

## 2014-06-07 DIAGNOSIS — Z3202 Encounter for pregnancy test, result negative: Secondary | ICD-10-CM | POA: Diagnosis not present

## 2014-06-07 DIAGNOSIS — S99921A Unspecified injury of right foot, initial encounter: Secondary | ICD-10-CM | POA: Diagnosis present

## 2014-06-07 DIAGNOSIS — S92911A Unspecified fracture of right toe(s), initial encounter for closed fracture: Secondary | ICD-10-CM

## 2014-06-07 DIAGNOSIS — W208XXA Other cause of strike by thrown, projected or falling object, initial encounter: Secondary | ICD-10-CM | POA: Insufficient documentation

## 2014-06-07 MED ORDER — IBUPROFEN 800 MG PO TABS
800.0000 mg | ORAL_TABLET | Freq: Once | ORAL | Status: AC
  Administered 2014-06-07: 800 mg via ORAL
  Filled 2014-06-07: qty 1

## 2014-06-07 MED ORDER — HYDROCODONE-ACETAMINOPHEN 5-325 MG PO TABS: ORAL_TABLET | ORAL | Status: DC

## 2014-06-07 NOTE — ED Notes (Signed)
Ortho tech at bedside 

## 2014-06-07 NOTE — ED Provider Notes (Signed)
Medical screening examination/treatment/procedure(s) were performed by non-physician practitioner and as supervising physician I was immediately available for consultation/collaboration.   EKG Interpretation None        Gilda Creasehristopher J. Othniel Maret, MD 06/07/14 2329

## 2014-06-07 NOTE — ED Provider Notes (Signed)
CSN: [id number]     Arrival date & time 06/07/14  2240 History   This chart was scribed for a non-physician practitioner, Wynetta EmeryNicole Preethi Scantlebury, PA-C working with Gilda Creasehristopher J. Pollina, MD by [place] Peace, ED Scribe. The patient was seen in [facility]. The patient's care was started at 11:15 PM.    Chief Complaint  Patient presents with  . Foot Injury      Patient is a 28 y.o. female presenting with foot injury. The history is provided by the patient. No language interpreter was used.  Foot Injury  HPI Comments: [name] Bass is a 28 y.o. female who presents to the Emergency Department complaining of right foot injury onset tonight with associated swelling that occurred while pt was at work and dropped a cutting board on her foot. She rates pain currently as 9/10. She states pain is exacerbated with weight bearing to affected area. Pt is former smoker.    Past Medical History  Diagnosis Date  . Abnormal Pap smear   . Polycystic ovarian syndrome 05/08/05  . 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 CIN I (cervical intraepithelial neoplasia I)   . Herpes simplex without mention of complication   . FHx: suicide   . H/O amenorrhea 02/19/04  . Vaginitis and vulvovaginitis 04/29/04  . Bacterial vaginosis 04/29/04  . Dysuria 04/04/05  . Irregular periods/menstrual cycles   . Hx: UTI (urinary tract infection)   . Dyspareunia 05/02/10  . Herpes simplex type 2 infection     "last outbreak 3 weeks ago"  . Infection     UTI  . Chronic kidney disease     kidney stones  . Vaginal Pap smear, abnormal    Past Surgical History  Procedure Laterality Date  . Cholecystectomy, laparoscopic  2010  . Wisdom tooth extraction  2001  . Tonsillectomy  2006  . Cystoscopy with ureteroscopy  03/28/2012    Procedure: CYSTOSCOPY WITH URETEROSCOPY;  Surgeon: Marcine MatarStephen Dahlstedt, MD;  Location: [facility];  Service: Urology;  Laterality: Right;  RIGHT URETEROSCOPIC STONE EXTRACTION W/ LASER (Pt is [redacted] wk pregnant)     . Stone extraction with basket  03/28/2012    Procedure: STONE EXTRACTION WITH BASKET;  Surgeon: Marcine MatarStephen Dahlstedt, MD;  Location: [facility];  Service: Urology;  Laterality: Right;  . Tubal ligation  08/04/2012    Procedure: POST PARTUM TUBAL LIGATION;  Surgeon: Hal MoralesVanessa P Haygood, MD;  Location: [facility];  Service: Gynecology;  Laterality: Bilateral;  Bilateral post partum tubal ligation   Family History  Problem Relation Age of Onset  . Heart disease Father     heart attack in oct.  lm  . Cancer Maternal Uncle     bone cancer  . Diabetes Maternal Uncle   . Cancer Maternal Grandmother     ovarian cancer  . Suicidality Mother   . Diabetes Maternal Aunt   . Cancer Maternal Aunt     lung  . Hypertension Paternal Uncle    History  Substance Use Topics  . Smoking status: Former Games developermoker  . Smokeless tobacco: Never Used     Comment: Jan 2015  . Alcohol Use: 0.0 oz/week    0 Glasses of wine per week     Comment: occasional   OB History   Grav Para Term Preterm Abortions TAB SAB Ect Mult Living   3 3 3       3      Review of Systems A complete 10 system review of systems was obtained and all  systems are negative except as noted in the HPI and PMH.     Allergies  Amoxicillin  Home Medications   Prior to Admission medications   Medication Sig Start Date End Date Taking? Authorizing Provider  cephALEXin (KEFLEX) 500 MG capsule Take 1 capsule (500 mg total) by mouth 2 (two) times daily. 04/18/14   Jean RosenthalSusan P Lineberry, NP  HYDROcodone-acetaminophen (NORCO/VICODIN) 5-325 MG per tablet Take 1-2 tablets by mouth every 6 hours as needed for pain. 06/07/14   Marguarite Markov, PA-C  ibuprofen (ADVIL,MOTRIN) 200 MG tablet Take 600 mg by mouth every 4 (four) hours as needed for mild pain or moderate pain.    Historical Provider, MD  Phenazopyridine HCl (AZO URINARY PAIN PO) Take 2 tablets by mouth once.    Historical Provider, MD   BP 132/84  Pulse 85  Temp(Src) 98.1 F (36.7 C) (Oral)  Resp  18  Ht 5\' 10"  (1.778 m)  Wt 185 lb (83.915 kg)  BMI 26.54 kg/m2  SpO2 99%  LMP 06/07/2014 Physical Exam  Nursing note and vitals reviewed. Constitutional: She is oriented to person, place, and time. She appears well-developed and well-nourished. No distress.  HENT:  Head: Normocephalic and atraumatic.  Eyes: Conjunctivae and EOM are normal.  Neck: Neck supple. No tracheal deviation present.  Cardiovascular: Normal rate.   Pulmonary/Chest: Effort normal. No respiratory distress.  Musculoskeletal: Normal range of motion.       Feet:  Diffusely tender to palpation, neurovascularly intact, no subungual hematomas, skin is intact.  Neurological: She is alert and oriented to person, place, and time.  Skin: Skin is warm and dry.  Psychiatric: She has a normal mood and affect. Her behavior is normal.    ED Course  Procedures (including critical care time) Labs Review Labs Reviewed - No data to display  Results for orders placed during the hospital encounter of 04/18/14  URINALYSIS, ROUTINE W REFLEX MICROSCOPIC      Result Value Ref Range   Color, Urine AMBER (*) YELLOW   APPearance HAZY (*) CLEAR   Specific Gravity, Urine 1.015  1.005 - 1.030   pH 7.0  5.0 - 8.0   Glucose, UA 250 (*) NEGATIVE mg/dL   Hgb urine dipstick SMALL (*) NEGATIVE   Bilirubin Urine SMALL (*) NEGATIVE   Ketones, ur 15 (*) NEGATIVE mg/dL   Protein, ur >782>300 (*) NEGATIVE mg/dL   Urobilinogen, UA >9.5>8.0 (*) 0.0 - 1.0 mg/dL   Nitrite POSITIVE (*) NEGATIVE   Leukocytes, UA MODERATE (*) NEGATIVE  URINE MICROSCOPIC-ADD ON      Result Value Ref Range   Squamous Epithelial / LPF FEW (*) RARE   WBC, UA TOO NUMEROUS TO COUNT  <3 WBC/hpf   RBC / HPF 3-6  <3 RBC/hpf   Bacteria, UA RARE  RARE   Urine-Other MUCOUS PRESENT    POCT PREGNANCY, URINE      Result Value Ref Range   Preg Test, Ur NEGATIVE  NEGATIVE   No results found.    Imaging Review Dg Foot Complete Right  06/07/2014   CLINICAL DATA:  28 year old  female status post blunt trauma when dropped a cutting board on her foot. Pain from the second through the fifth metatarsal region. Initial encounter.  EXAM: RIGHT FOOT COMPLETE - 3+ VIEW  COMPARISON:  None.  FINDINGS: Bone mineralization is within normal limits. Calcaneus intact. Joint spaces and alignment within normal limits. Nondisplaced comminuted fracture through the tuft of the second distal phalanx. Other phalanges intact. No metatarsal  fracture identified.  IMPRESSION: Comminuted nondisplaced fracture through the tuft of the right foot second distal phalanx.   Electronically Signed   By: Augusto Gamble M.D.   On: 06/07/2014 23:11     EKG Interpretation None     Medications  ibuprofen (ADVIL,MOTRIN) tablet 800 mg (not administered)    11:17 PM- Treatment plan was discussed with patient who verbalizes understanding and agrees.   MDM   Final diagnoses:  Toe fracture, right, closed, initial encounter    Filed Vitals:   06/07/14 2248  BP: 132/84  Pulse: 85  Temp: 98.1 F (36.7 C)  TempSrc: Oral  Resp: 18  Height: 5\' 10"  (1.778 m)  Weight: 185 lb (83.915 kg)  SpO2: 99%    Medications  ibuprofen (ADVIL,MOTRIN) tablet 800 mg (not administered)    [name] Bass is a 28 y.o. female presenting with right toe pain after she dropped a heavy cutting board on the foot prior to arrival. There is a small fracture of the distal phalanx of the right second digit. Patient counseled on buddy taping, given crutches, given podiatry referral.  Evaluation does not show pathology that would require ongoing emergent intervention or inpatient treatment. Pt is hemodynamically stable and mentating appropriately. Discussed findings and plan with patient/guardian, who agrees with care plan. All questions answered. Return precautions discussed and outpatient follow up given.   New Prescriptions   HYDROCODONE-ACETAMINOPHEN (NORCO/VICODIN) 5-325 MG PER TABLET    Take 1-2 tablets by mouth every 6  hours as needed for pain.     I personally performed the services described in this documentation, which was scribed in my presence. The recorded information has been reviewed and is accurate.   Wynetta Emery, PA-C 06/07/14 2328

## 2014-06-07 NOTE — Progress Notes (Signed)
Orthopedic Tech Progress Note Patient Details:  Alver Fishermanda C Leyendecker-Parker 02/06/1986 [id number] Buddy taped Rt. first and second toes.  Fit crutches and taught pt. use of same. Ortho Devices Type of Ortho Device: Buddy tape Ortho Device/Splint Location: RLE Ortho Device/Splint Interventions: Application   Lesle ChrisGilliland, Daxtin Leiker L 06/07/2014, 11:47 PM

## 2014-06-07 NOTE — Discharge Instructions (Signed)
Rest, Ice intermittently (in the first 24-48 hours), Gentle compression with an Ace wrap, and elevate (Limb above the level of the heart)   Take up to 800mg  of ibuprofen (that is usually 4 over the counter pills)  3 times a day for 5 days. Take with food.  Take vicodin for breakthrough pain, do not drink alcohol, drive, care for children or do other critical tasks while taking vicodin.  Please follow with your primary care doctor in the next 2 days for a check-up. They must obtain records for further management.   Do not hesitate to return to the Emergency Department for any new, worsening or concerning symptoms.   Buddy Taping of Toes We have taped your toes together to keep them from moving. This is called "buddy taping" since we used a part of your own body to keep the injured part still. We placed soft padding between your toes to keep them from rubbing against each other. Buddy taping will help with healing and to reduce pain. Keep your toes buddy taped together for as long as directed by your caregiver. HOME CARE INSTRUCTIONS   Raise your injured area above the level of your heart while sitting or lying down. Prop it up with pillows.  An ice pack used every twenty minutes, while awake, for the first one to two days may be helpful. Put ice in a plastic bag and put a towel between the bag and your skin.  Watch for signs that the taping is too tight. These signs may be:  Numbness of your taped toes.  Coolness of your taped toes.  Color change in the area beyond the tape.  Increased pain.  If you have any of these signs, loosen or rewrap the tape. If you need to loosen or rewrap the buddy tape, make sure you use the padding again. SEEK IMMEDIATE MEDICAL CARE IF:   You have worse pain, swelling, inflammation (soreness), drainage or bleeding after you rewrap the tape.  Any new problems occur. MAKE SURE YOU:   Understand these instructions.  Will watch your condition.  Will get  help right away if you are not doing well or get worse. Document Released: 05/11/2004 Document Revised: 10/30/2011 Document Reviewed: 08/04/2008 [facility] Patient Information 2015 [employer], [place]. This information is not intended to replace advice given to you by your health care provider. Make sure you discuss any questions you have with your health care provider.  Toe Fracture Your caregiver has diagnosed you as having a fractured toe. A toe fracture is a break in the bone of a toe. "Buddy taping" is a way of splinting your broken toe, by taping the broken toe to the toe next to it. This "buddy taping" will keep the injured toe from moving beyond normal range of motion. Buddy taping also helps the toe heal in a more normal alignment. It may take 6 to 8 weeks for the toe injury to heal. HOME CARE INSTRUCTIONS   Leave your toes taped together for as long as directed by your caregiver or until you see a doctor for a follow-up examination. You can change the tape after bathing. Always use a small piece of gauze or cotton between the toes when taping them together. This will help the skin stay dry and prevent infection.  Apply ice to the injury for 15-20 minutes each hour while awake for the first 2 days. Put the ice in a plastic bag and place a towel between the bag of ice and  your skin.  After the first 2 days, apply heat to the injured area. Use heat for the next 2 to 3 days. Place a heating pad on the foot or soak the foot in warm water as directed by your caregiver.  Keep your foot elevated as much as possible to lessen swelling.  Wear sturdy, supportive shoes. The shoes should not pinch the toes or fit tightly against the toes.  Your caregiver may prescribe a rigid shoe if your foot is very swollen.  Your may be given crutches if the pain is too great and it hurts too much to walk.  Only take over-the-counter or prescription medicines for pain, discomfort, or fever as directed by your  caregiver.  If your caregiver has given you a follow-up appointment, it is very important to keep that appointment. Not keeping the appointment could result in a chronic or permanent injury, pain, and disability. If there is any problem keeping the appointment, you must call back to this facility for assistance. SEEK MEDICAL CARE IF:   You have increased pain or swelling, not relieved with medications.  The pain does not get better after 1 week.  Your injured toe is cold when the others are warm. SEEK IMMEDIATE MEDICAL CARE IF:   The toe becomes cold, numb, or white.  The toe becomes hot (inflamed) and red. Document Released: 08/04/2000 Document Revised: 10/30/2011 Document Reviewed: 03/23/2008 [facility] Patient Information 2015 [place], [place]. This information is not intended to replace advice given to you by your health care provider. Make sure you discuss any questions you have with your health care provider.

## 2014-06-07 NOTE — ED Notes (Signed)
Pt reports dropped long cutting board onto R foot. Pt c/o pain across base of all five toes on top and bottom and inability to move toes. Slight swelling noted.

## 2014-06-09 ENCOUNTER — Ambulatory Visit: Payer: Self-pay | Admitting: Podiatry

## 2014-06-22 ENCOUNTER — Encounter (HOSPITAL_COMMUNITY): Payer: Self-pay | Admitting: Emergency Medicine

## 2015-03-03 ENCOUNTER — Observation Stay (HOSPITAL_COMMUNITY)
Admission: EM | Admit: 2015-03-03 | Discharge: 2015-03-04 | Disposition: A | Discharge status: Home / Self Care | Payer: Self-pay | Attending: Internal Medicine | Admitting: Internal Medicine

## 2015-03-03 ENCOUNTER — Emergency Department (HOSPITAL_COMMUNITY): Payer: Self-pay

## 2015-03-03 ENCOUNTER — Encounter (HOSPITAL_COMMUNITY): Payer: Self-pay | Admitting: *Deleted

## 2015-03-03 DIAGNOSIS — Z8742 Personal history of other diseases of the female genital tract: Secondary | ICD-10-CM | POA: Insufficient documentation

## 2015-03-03 DIAGNOSIS — K5900 Constipation, unspecified: Secondary | ICD-10-CM | POA: Diagnosis present

## 2015-03-03 DIAGNOSIS — Z8541 Personal history of malignant neoplasm of cervix uteri: Secondary | ICD-10-CM | POA: Insufficient documentation

## 2015-03-03 DIAGNOSIS — R519 Headache, unspecified: Secondary | ICD-10-CM | POA: Diagnosis present

## 2015-03-03 DIAGNOSIS — R42 Dizziness and giddiness: Secondary | ICD-10-CM | POA: Insufficient documentation

## 2015-03-03 DIAGNOSIS — Z87891 Personal history of nicotine dependence: Secondary | ICD-10-CM | POA: Insufficient documentation

## 2015-03-03 DIAGNOSIS — Z8619 Personal history of other infectious and parasitic diseases: Secondary | ICD-10-CM | POA: Insufficient documentation

## 2015-03-03 DIAGNOSIS — Z880 Allergy status to penicillin: Secondary | ICD-10-CM | POA: Insufficient documentation

## 2015-03-03 DIAGNOSIS — Z9851 Tubal ligation status: Secondary | ICD-10-CM | POA: Insufficient documentation

## 2015-03-03 DIAGNOSIS — Z3202 Encounter for pregnancy test, result negative: Secondary | ICD-10-CM | POA: Insufficient documentation

## 2015-03-03 DIAGNOSIS — N189 Chronic kidney disease, unspecified: Secondary | ICD-10-CM | POA: Insufficient documentation

## 2015-03-03 DIAGNOSIS — E876 Hypokalemia: Secondary | ICD-10-CM | POA: Diagnosis present

## 2015-03-03 DIAGNOSIS — R109 Unspecified abdominal pain: Secondary | ICD-10-CM | POA: Diagnosis present

## 2015-03-03 DIAGNOSIS — R51 Headache: Secondary | ICD-10-CM | POA: Insufficient documentation

## 2015-03-03 DIAGNOSIS — K529 Noninfective gastroenteritis and colitis, unspecified: Secondary | ICD-10-CM | POA: Diagnosis present

## 2015-03-03 DIAGNOSIS — K567 Ileus, unspecified: Principal | ICD-10-CM | POA: Insufficient documentation

## 2015-03-03 LAB — CBC
HEMATOCRIT: 40.2 % (ref 36.0–46.0)
HEMOGLOBIN: 14.1 g/dL (ref 12.0–15.0)
MCH: 31 pg (ref 26.0–34.0)
MCHC: 35.1 g/dL (ref 30.0–36.0)
MCV: 88.4 fL (ref 78.0–100.0)
Platelets: 228 10*3/uL (ref 150–400)
RBC: 4.55 MIL/uL (ref 3.87–5.11)
RDW: 12.7 % (ref 11.5–15.5)
WBC: 10.3 10*3/uL (ref 4.0–10.5)

## 2015-03-03 LAB — URINALYSIS, ROUTINE W REFLEX MICROSCOPIC
Bilirubin Urine: NEGATIVE
Glucose, UA: NEGATIVE mg/dL
HGB URINE DIPSTICK: NEGATIVE
Ketones, ur: 15 mg/dL — AB
LEUKOCYTES UA: NEGATIVE
Nitrite: NEGATIVE
PH: 6 (ref 5.0–8.0)
PROTEIN: NEGATIVE mg/dL
Specific Gravity, Urine: 1.021 (ref 1.005–1.030)
Urobilinogen, UA: 1 mg/dL (ref 0.0–1.0)

## 2015-03-03 LAB — CBG MONITORING, ED: Glucose-Capillary: 134 mg/dL — ABNORMAL HIGH (ref 65–99)

## 2015-03-03 LAB — HCG, QUANTITATIVE, PREGNANCY

## 2015-03-03 LAB — COMPREHENSIVE METABOLIC PANEL
ALBUMIN: 4.1 g/dL (ref 3.5–5.0)
ALK PHOS: 51 U/L (ref 38–126)
ALT: 15 U/L (ref 14–54)
ANION GAP: 9 (ref 5–15)
AST: 16 U/L (ref 15–41)
BUN: 8 mg/dL (ref 6–20)
CALCIUM: 9.1 mg/dL (ref 8.9–10.3)
CO2: 21 mmol/L — ABNORMAL LOW (ref 22–32)
CREATININE: 0.58 mg/dL (ref 0.44–1.00)
Chloride: 108 mmol/L (ref 101–111)
GFR calc Af Amer: >60 mL/min (ref >60)
GFR calc non Af Amer: >60 mL/min (ref >60)
GLUCOSE: 133 mg/dL — AB (ref 65–99)
Potassium: 3.3 mmol/L — ABNORMAL LOW (ref 3.5–5.1)
Sodium: 138 mmol/L (ref 135–145)
TOTAL PROTEIN: 6.7 g/dL (ref 6.5–8.1)
Total Bilirubin: 1 mg/dL (ref 0.3–1.2)

## 2015-03-03 LAB — LACTIC ACID, PLASMA: Lactic Acid, Venous: 1.3 mmol/L (ref 0.5–2.0)

## 2015-03-03 LAB — I-STAT CG4 LACTIC ACID, ED: LACTIC ACID, VENOUS: 2.74 mmol/L — AB (ref 0.5–2.0)

## 2015-03-03 LAB — LIPASE, BLOOD: Lipase: 22 U/L (ref 22–51)

## 2015-03-03 MED ORDER — MORPHINE SULFATE 2 MG/ML IJ SOLN: 2.0000 mg | INTRAMUSCULAR | Status: DC | PRN

## 2015-03-03 MED ORDER — PROCHLORPERAZINE EDISYLATE 5 MG/ML IJ SOLN
10.0000 mg | Freq: Four times a day (QID) | INTRAMUSCULAR | Status: DC | PRN
  Administered 2015-03-03: 10 mg via INTRAVENOUS
  Filled 2015-03-03 (×2): qty 2

## 2015-03-03 MED ORDER — ENOXAPARIN SODIUM 40 MG/0.4ML ~~LOC~~ SOLN
40.0000 mg | SUBCUTANEOUS | Status: DC
  Filled 2015-03-03 (×2): qty 0.4

## 2015-03-03 MED ORDER — ONDANSETRON HCL 4 MG/2ML IJ SOLN: 4.0000 mg | Freq: Four times a day (QID) | INTRAMUSCULAR | Status: DC | PRN

## 2015-03-03 MED ORDER — PANTOPRAZOLE SODIUM 40 MG IV SOLR
40.0000 mg | INTRAVENOUS | Status: DC
  Administered 2015-03-03: 40 mg via INTRAVENOUS
  Filled 2015-03-03 (×2): qty 40

## 2015-03-03 MED ORDER — ACETAMINOPHEN 650 MG RE SUPP: 650.0000 mg | Freq: Four times a day (QID) | RECTAL | Status: DC | PRN

## 2015-03-03 MED ORDER — IOHEXOL 300 MG/ML  SOLN
25.0000 mL | Freq: Once | INTRAMUSCULAR | Status: AC | PRN
  Administered 2015-03-03: 25 mL via ORAL

## 2015-03-03 MED ORDER — ONDANSETRON 4 MG PO TBDP
4.0000 mg | ORAL_TABLET | Freq: Once | ORAL | Status: AC | PRN
  Administered 2015-03-03: 4 mg via ORAL

## 2015-03-03 MED ORDER — IOHEXOL 300 MG/ML  SOLN
100.0000 mL | Freq: Once | INTRAMUSCULAR | Status: AC | PRN
  Administered 2015-03-03: 100 mL via INTRAVENOUS

## 2015-03-03 MED ORDER — ALBUTEROL SULFATE (2.5 MG/3ML) 0.083% IN NEBU: 2.5000 mg | INHALATION_SOLUTION | RESPIRATORY_TRACT | Status: DC | PRN

## 2015-03-03 MED ORDER — HYDROCODONE-ACETAMINOPHEN 5-325 MG PO TABS: 1.0000 | ORAL_TABLET | ORAL | Status: DC | PRN

## 2015-03-03 MED ORDER — POTASSIUM CHLORIDE CRYS ER 20 MEQ PO TBCR: 40.0000 meq | EXTENDED_RELEASE_TABLET | Freq: Once | ORAL | Status: DC

## 2015-03-03 MED ORDER — DIPHENHYDRAMINE HCL 50 MG/ML IJ SOLN
25.0000 mg | Freq: Once | INTRAMUSCULAR | Status: AC
  Administered 2015-03-03: 25 mg via INTRAVENOUS
  Filled 2015-03-03: qty 1

## 2015-03-03 MED ORDER — ONDANSETRON HCL 4 MG PO TABS: 4.0000 mg | ORAL_TABLET | Freq: Four times a day (QID) | ORAL | Status: DC | PRN

## 2015-03-03 MED ORDER — SODIUM CHLORIDE 0.9 % IJ SOLN: 3.0000 mL | Freq: Two times a day (BID) | INTRAMUSCULAR | Status: DC

## 2015-03-03 MED ORDER — POTASSIUM CHLORIDE IN NACL 40-0.9 MEQ/L-% IV SOLN
INTRAVENOUS | Status: DC
  Administered 2015-03-03 – 2015-03-04 (×2): 125 mL/h via INTRAVENOUS
  Filled 2015-03-03 (×3): qty 1000

## 2015-03-03 MED ORDER — SODIUM CHLORIDE 0.9 % IV BOLUS (SEPSIS)
1000.0000 mL | Freq: Once | INTRAVENOUS | Status: AC
Start: 2015-03-03 — End: 2015-03-03
  Administered 2015-03-03: 1000 mL via INTRAVENOUS

## 2015-03-03 MED ORDER — DEXTROSE 5 % IV SOLN
1.0000 g | Freq: Once | INTRAVENOUS | Status: AC
  Administered 2015-03-03: 1 g via INTRAVENOUS
  Filled 2015-03-03: qty 10

## 2015-03-03 MED ORDER — ONDANSETRON 4 MG PO TBDP
ORAL_TABLET | ORAL | Status: AC
  Filled 2015-03-03: qty 1

## 2015-03-03 MED ORDER — ACETAMINOPHEN 325 MG PO TABS: 650.0000 mg | ORAL_TABLET | Freq: Four times a day (QID) | ORAL | Status: DC | PRN

## 2015-03-03 MED ORDER — ALUM & MAG HYDROXIDE-SIMETH 200-200-20 MG/5ML PO SUSP: 30.0000 mL | Freq: Four times a day (QID) | ORAL | Status: DC | PRN

## 2015-03-03 MED ORDER — SODIUM CHLORIDE 0.9 % IV BOLUS (SEPSIS)
1000.0000 mL | Freq: Once | INTRAVENOUS | Status: AC
  Administered 2015-03-03: 1000 mL via INTRAVENOUS

## 2015-03-03 NOTE — Progress Notes (Signed)
New Admission Note:   Arrival Method: stretcher Mental Orientation: alert and oriented x 4 Telemetry: 26 Assessment: Skin:intact ZO:XWRUEV:right hand Pain: denies  Tubes:none Safety Measures: Safety Fall Prevention Plan has been given, discussed and signed Admission:  [address] Orientation: Patient has been orientated to the room, unit and staff.  Family:  Orders have been reviewed and implemented. Will continue to monitor the patient. Call light has been placed within reach Clement Sayresathie Symia Herdt, RN Phone number: [phone]

## 2015-03-03 NOTE — ED Notes (Signed)
Pt pale at triage, had two episodes of becoming diaphoretic and near syncopal.

## 2015-03-03 NOTE — ED Notes (Signed)
Admitting MD at bedside.

## 2015-03-03 NOTE — ED Provider Notes (Signed)
CSN: [id number]     Arrival date & time 03/03/15  1222 History   First MD Initiated Contact with Patient 03/03/15 1325     Chief Complaint  Patient presents with  . Headache  . Abdominal Pain     (Consider location/radiation/quality/duration/timing/severity/associated sxs/prior Treatment) Patient is a 29 y.o. female presenting with abdominal pain.  Abdominal Pain Pain location:  Epigastric and RLQ Pain quality: aching   Pain severity now: 6/10. Duration: since last night. Timing:  Constant Progression:  Worsening Chronicity:  New Relieved by:  Nothing Worsened by:  Nothing tried Ineffective treatments:  None tried Associated symptoms: anorexia, fatigue, flatus and nausea   Associated symptoms: no chest pain, no constipation, no cough, no diarrhea, no dysuria, no fever, no hematuria, no melena, no shortness of breath, no sore throat, no vaginal bleeding, no vaginal discharge and no vomiting   Risk factors: not pregnant (LMP 2 wk ago, tubal ligation previously)     Past Medical History  Diagnosis Date  . Abnormal Pap smear   . Polycystic ovarian syndrome 05/08/05  . 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 CIN I (cervical intraepithelial neoplasia I)   . Herpes simplex without mention of complication   . FHx: suicide   . H/O amenorrhea 02/19/04  . Vaginitis and vulvovaginitis 04/29/04  . Bacterial vaginosis 04/29/04  . Dysuria 04/04/05  . Irregular periods/menstrual cycles   . Hx: UTI (urinary tract infection)   . Dyspareunia 05/02/10  . Herpes simplex type 2 infection     "last outbreak 3 weeks ago"  . Infection     UTI  . Chronic kidney disease     kidney stones  . Vaginal Pap smear, abnormal    Past Surgical History  Procedure Laterality Date  . Cholecystectomy, laparoscopic  2010  . Wisdom tooth extraction  2001  . Tonsillectomy  2006  . Cystoscopy with ureteroscopy  03/28/2012    Procedure: CYSTOSCOPY WITH URETEROSCOPY;  Surgeon: Marcine Matar, MD;  Location: [facility];   Service: Urology;  Laterality: Right;  RIGHT URETEROSCOPIC STONE EXTRACTION W/ LASER (Pt is [redacted] wk pregnant)    . Stone extraction with basket  03/28/2012    Procedure: STONE EXTRACTION WITH BASKET;  Surgeon: Marcine Matar, MD;  Location: [facility];  Service: Urology;  Laterality: Right;  . Tubal ligation  08/04/2012    Procedure: POST PARTUM TUBAL LIGATION;  Surgeon: Hal Morales, MD;  Location: [facility];  Service: Gynecology;  Laterality: Bilateral;  Bilateral post partum tubal ligation   Family History  Problem Relation Age of Onset  . Heart disease Father     heart attack in oct.  lm  . Cancer Maternal Uncle     bone cancer  . Diabetes Maternal Uncle   . Cancer Maternal Grandmother     ovarian cancer  . Suicidality Mother   . Diabetes Maternal Aunt   . Cancer Maternal Aunt     lung  . Hypertension Paternal Uncle    History  Substance Use Topics  . Smoking status: Former Games developer  . Smokeless tobacco: Never Used     Comment: Jan 2015  . Alcohol Use: 0.0 oz/week    0 Glasses of wine per week     Comment: occasional   OB History    Gravida Para Term Preterm AB TAB SAB Ectopic Multiple Living   Review of Systems  Constitutional: Positive for fatigue. Negative  for fever.  HENT: Negative for sore throat.   Eyes: Negative for visual disturbance.  Respiratory: Negative for cough and shortness of breath.   Cardiovascular: Negative for chest pain.  Gastrointestinal: Positive for nausea, abdominal pain, anorexia and flatus. Negative for vomiting, diarrhea, constipation and melena.  Genitourinary: Negative for dysuria, hematuria, vaginal bleeding, vaginal discharge and difficulty urinating.  Musculoskeletal: Negative for back pain and neck pain.  Skin: Negative for rash.  Neurological: Positive for light-headedness. Negative for syncope and headaches.      Allergies  Amoxicillin  Home Medications   Prior to Admission medications   Medication Sig  Start Date End Date Taking? Authorizing Provider  ibuprofen (ADVIL,MOTRIN) 200 MG tablet Take 600 mg by mouth every 4 (four) hours as needed for mild pain or moderate pain.   Yes Historical Provider, MD  cephALEXin (KEFLEX) 500 MG capsule Take 1 capsule (500 mg total) by mouth 2 (two) times daily. Patient not taking: Reported on 03/03/2015 04/18/14   Jean Rosenthal, NP  HYDROcodone-acetaminophen (NORCO/VICODIN) 5-325 MG per tablet Take 1-2 tablets by mouth every 6 hours as needed for pain. Patient not taking: Reported on 03/03/2015 06/07/14   Joni Reining Pisciotta, PA-C   BP 91/50 mmHg  Pulse 79  Temp(Src) 98 F (36.7 C) (Oral)  Resp 15  Ht 5\' 10"  (1.778 m)  Wt 155 lb (70.308 kg)  BMI 22.24 kg/m2  SpO2 99%  LMP 02/17/2015 Physical Exam  Constitutional: She is oriented to person, place, and time. She appears well-developed and well-nourished. She has a sickly appearance. She appears ill. No distress.  HENT:  Head: Normocephalic and atraumatic.  Eyes: Conjunctivae and EOM are normal.  Neck: Normal range of motion.  Cardiovascular: Normal rate, regular rhythm, normal heart sounds and intact distal pulses.  Exam reveals no gallop and no friction rub.   No murmur heard. Pulmonary/Chest: Effort normal and breath sounds normal. No respiratory distress. She has no wheezes. She has no rales.  Abdominal: Soft. She exhibits no distension. There is tenderness in the right lower quadrant. There is guarding and tenderness at McBurney's point. There is negative Murphy's sign.  Musculoskeletal: She exhibits no edema or tenderness.  Neurological: She is alert and oriented to person, place, and time. She has normal strength. No cranial nerve deficit or sensory deficit. Coordination normal. GCS eye subscore is 4. GCS verbal subscore is 5. GCS motor subscore is 6.  Skin: Skin is warm and dry. No rash noted. She is not diaphoretic. No erythema.  Nursing note and vitals reviewed.   ED Course  Procedures  (including critical care time) Labs Review Labs Reviewed  COMPREHENSIVE METABOLIC PANEL - Abnormal; Notable for the following:    Potassium 3.3 (*)    CO2 21 (*)    Glucose, Bld 133 (*)    All other components within normal limits  URINALYSIS, ROUTINE W REFLEX MICROSCOPIC (NOT AT [facility]) - Abnormal; Notable for the following:    Ketones, ur 15 (*)    All other components within normal limits  CBG MONITORING, ED - Abnormal; Notable for the following:    Glucose-Capillary 134 (*)    All other components within normal limits  I-STAT CG4 LACTIC ACID, ED - Abnormal; Notable for the following:    Lactic Acid, Venous 2.74 (*)    All other components within normal limits  LIPASE, BLOOD  CBC  HCG, QUANTITATIVE, PREGNANCY    Imaging Review Ct Abdomen Pelvis W Contrast  03/03/2015   CLINICAL DATA:  Mid  and lower abdominal pain starting last night, headache, nausea, weakness  EXAM: CT ABDOMEN AND PELVIS WITH CONTRAST  TECHNIQUE: Multidetector CT imaging of the abdomen and pelvis was performed using the standard protocol following bolus administration of intravenous contrast.  CONTRAST:  100mL OMNIPAQUE IOHEXOL 300 MG/ML  SOLN  COMPARISON:  03/28/2011  FINDINGS: The lung bases are unremarkable. Sagittal images of the spine are unremarkable. Enhanced liver shows no focal mass. Status post cholecystectomy. The spleen, pancreas and adrenal glands are unremarkable. Enhanced kidneys are symmetrical in size. There is nonobstructive calcification in midpole of the right kidney measures 2.3 mm. At least 3 nonobstructive calcifications are noted within left kidney the largest in midpole measures 3.7 mm.  No hydronephrosis or hydroureter.  No aortic aneurysm.  Heart size within normal limits.  No pericardial effusion.  There is no pericecal inflammation.  Normal appendix.  There are mild fluid and gas distended small bowel loops in jejunum and left mid abdomen with some air-fluid levels. Findings suspicious for mild  ileus or enteritis. Less likely early small bowel obstruction. There is no evidence of transition point in caliber of small bowel.  No colonic obstruction. Small amount of free fluid noted within posterior cul-de-sac. The uterus and adnexa are unremarkable. Pelvic phleboliths are noted. Urinary bladder is unremarkable. Tiny umbilical hernia containing fat without evidence of acute complication.  IMPRESSION: 1. There are mild fluid and gas distended small bowel loops in jejunum and left mid abdomen with some air-fluid levels. Findings suspicious for enteritis or ileus. Less likely early small bowel obstruction. There is no evidence of transition point in caliber of small bowel. Clinical correlation is necessary. 2. Normal appendix.  No pericecal inflammation. 3. No colonic obstruction. 4. Bilateral nonobstructive nephrolithiasis. No hydronephrosis or hydroureter. 5. Small amount of pelvic free fluid posterior cul-de-sac.   Electronically Signed   By: Natasha MeadLiviu  Pop M.D.   On: 03/03/2015 15:28     EKG Interpretation None      MDM   Final diagnoses:  None   29 year old female with no significant medical history presents with concern for abdominal pain, nausea and decreased appetite. Patient presyncopal on arrival to the emergency department with initial blood pressures in the low 90s systolic. EKG evaluated by me and shows no sign of prolonged QTc, no brugada, no sign of HOCM, and suspected junctional rhythm with rate in the 50s.   Pt given IVF with improvement of blood pressures to 100 and 110s systolic. Initial lactic acid elevated and ordered repeat.  UPreg negative, no symptoms to suggest pelvic cause of pain including no discharge, hx not consistent with ovarian torsion. CT abd/pelvis done to eval for appendicitis and showed dilated ileal loops indicating possible enteritis. Doubt obstruction by hx.  Patient also reported slow onset of headache, with full range of motion of her neck and normal neurologic  exam and have low suspicion for subarachnoid hemorrhage, meningitis, intracranial bleed.  Her headache improved with Compazine and Benadryl.  Given patient's initial low blood pressures, ill appearance, decreased po and presence of enteritis will admit for further evaluation.    Alvira MondayErin Martrice Apt, MD 03/03/15 [phone]

## 2015-03-03 NOTE — Progress Notes (Signed)
Received report from ED.  Diania Co, BSN, RN-BC.

## 2015-03-03 NOTE — H&P (Signed)
History and Physical  [name] Bass ZOX:[id number] DOB: 09/28/1985 DOA: 03/03/2015  Referring physician: Dr. Alvira MondayErin Schlossman, EDP PCP: Default, Provider, MD  Outpatient Specialists:  1. None  Chief Complaint: Abdominal pain, nausea and headache  HPI: [name] Bass is a 29 y.o. female with no significant PMH except GYN related history-PCOS, CIN, HSV, UTI, STDs, s/p laparoscopic cholecystectomy and tubal ligation, presented [facility] ED with complaints of abdominal pain, nausea and headache. She was in her usual state of health until approximately 11 PM on 03/02/15 when she started experiencing subacute onset of abdominal pain. Initially the pain was located in the epigastric region but subsequently migrated to lower abdomen, cramping, progressively worsened to 10/10 in severity, nonradiating, associated with nausea but no vomiting. Denies fever or chills. Had normal BM yesterday. Has been passing flatus since. She and her boyfriend ate out at [employer] but he did not have any similar symptoms. She also complained of frontal headache, mild photophobia but no neck pain or other visual abnormalities. She tried to "sleep off" these complaints but remained uncomfortable overnight and was unable to sleep. Due to persisting symptoms, she presented to the ED. She denies dysuria, urinary frequency or vaginal discharge. On initial arrival, she had some dizziness, lightheadedness and feeling like she was going to pass out. She was noted to be afebrile, transiently hypotensive in the 80s. Urine microscopy negative, CT abdomen suggestive of ileus/enteritis, pregnancy test negative, potassium 3.3, CBC normal. She has received 2 L of IV fluids and her SPEP has improved in the 100s. Patient feels better with improvement of her abdominal pain and headache. Hospitalist admission requested.   Review of Systems: All systems reviewed and apart from history of presenting illness, are negative.  Past  Medical History  Diagnosis Date  . Abnormal Pap smear   . Polycystic ovarian syndrome 05/08/05  . 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 CIN I (cervical intraepithelial neoplasia I)   . Herpes simplex without mention of complication   . FHx: suicide   . H/O amenorrhea 02/19/04  . Vaginitis and vulvovaginitis 04/29/04  . Bacterial vaginosis 04/29/04  . Dysuria 04/04/05  . Irregular periods/menstrual cycles   . Hx: UTI (urinary tract infection)   . Dyspareunia 05/02/10  . Herpes simplex type 2 infection     "last outbreak 3 weeks ago"  . Infection     UTI  . Chronic kidney disease     kidney stones  . Vaginal Pap smear, abnormal    Past Surgical History  Procedure Laterality Date  . Cholecystectomy, laparoscopic  2010  . Wisdom tooth extraction  2001  . Tonsillectomy  2006  . Cystoscopy with ureteroscopy  03/28/2012    Procedure: CYSTOSCOPY WITH URETEROSCOPY;  Surgeon: Marcine MatarStephen Dahlstedt, MD;  Location: [facility];  Service: Urology;  Laterality: Right;  RIGHT URETEROSCOPIC STONE EXTRACTION W/ LASER (Pt is [redacted] wk pregnant)    . Stone extraction with basket  03/28/2012    Procedure: STONE EXTRACTION WITH BASKET;  Surgeon: Marcine MatarStephen Dahlstedt, MD;  Location: [facility];  Service: Urology;  Laterality: Right;  . Tubal ligation  08/04/2012    Procedure: POST PARTUM TUBAL LIGATION;  Surgeon: Hal MoralesVanessa P Haygood, MD;  Location: [facility];  Service: Gynecology;  Laterality: Bilateral;  Bilateral post partum tubal ligation   Social History:  reports that she has quit smoking. She has never used smokeless tobacco. She reports that she drinks alcohol. She reports that she does not use illicit drugs. Lives  with her children and boyfriend at home. Independent of activities of daily living. Denies alcohol use, smoking or drug abuse.  Allergies  Allergen Reactions  . Amoxicillin Rash    Family History  Problem Relation Age of Onset  . Heart disease Father     heart attack in oct.  lm  . Cancer Maternal Uncle      bone cancer  . Diabetes Maternal Uncle   . Cancer Maternal Grandmother     ovarian cancer  . Suicidality Mother   . Diabetes Maternal Aunt   . Cancer Maternal Aunt     lung  . Hypertension Paternal Uncle     Prior to Admission medications   Medication Sig Start Date End Date Taking? Authorizing Provider  ibuprofen (ADVIL,MOTRIN) 200 MG tablet Take 600 mg by mouth every 4 (four) hours as needed for mild pain or moderate pain.   Yes Historical Provider, MD   Physical Exam: Filed Vitals:   03/03/15 1618 03/03/15 1720 03/03/15 1740 03/03/15 1742  BP: 101/54 82/43 100/49 100/49  Pulse: 72 86 81 68  Temp:      TempSrc:      Resp: 18 16  16   Height:      Weight:      SpO2: 98% 97% 96% 97%   temperature: 64F. Patient was examined with a female ED RN chaperone in the room.  General exam: Moderately built and nourished pleasant young female patient, lying comfortably supine on the gurney in no obvious distress. Does not look septic or toxic.  Head, eyes and ENT: Nontraumatic and normocephalic. Pupils equally reacting to light and accommodation. Oral mucosa moist.  Neck: Supple without neck stiffness. No JVD, carotid bruit or thyromegaly.  Lymphatics: No lymphadenopathy.  Respiratory system: Clear to auscultation. No increased work of breathing.  Cardiovascular system: S1 and S2 heard, RRR. No JVD, murmurs, gallops, clicks or pedal edema.  Gastrointestinal system: Abdomen is nondistended, soft. Mild epigastric, suprapubic and LLQ tenderness without guarding or peritoneal signs. Normal bowel sounds heard. No organomegaly or masses appreciated.  Central nervous system: Alert and oriented. No focal neurological deficits.  Extremities: Symmetric 5 x 5 power. Peripheral pulses symmetrically felt.   Skin: No rashes or acute findings.  Musculoskeletal system: Negative exam.  Psychiatry: Pleasant and cooperative.   Labs on Admission:  Basic Metabolic Panel:  Recent Labs Lab  03/03/15 1304  NA 138  K 3.3*  CL 108  CO2 21*  GLUCOSE 133*  BUN 8  CREATININE 0.58  CALCIUM 9.1   Liver Function Tests:  Recent Labs Lab 03/03/15 1304  AST 16  ALT 15  ALKPHOS 51  BILITOT 1.0  PROT 6.7  ALBUMIN 4.1    Recent Labs Lab 03/03/15 1304  LIPASE 22   No results for input(s): AMMONIA in the last 168 hours. CBC:  Recent Labs Lab 03/03/15 1304  WBC 10.3  HGB 14.1  HCT 40.2  MCV 88.4  PLT 228   Cardiac Enzymes: No results for input(s): CKTOTAL, CKMB, CKMBINDEX, TROPONINI in the last 168 hours.  BNP (last 3 results) No results for input(s): PROBNP in the last 8760 hours. CBG:  Recent Labs Lab 03/03/15 1317  GLUCAP 134*    Radiological Exams on Admission: Ct Abdomen Pelvis W Contrast  03/03/2015   CLINICAL DATA:  Mid and lower abdominal pain starting last night, headache, nausea, weakness  EXAM: CT ABDOMEN AND PELVIS WITH CONTRAST  TECHNIQUE: Multidetector CT imaging of the abdomen and pelvis was performed using  the standard protocol following bolus administration of intravenous contrast.  CONTRAST:  OMNIPAQUE IOHEXOL 300 MG/ML  SOLN  COMPARISON:  03/28/2011  FINDINGS: The lung bases are unremarkable. Sagittal images of the spine are unremarkable. Enhanced liver shows no focal mass. Status post cholecystectomy. The spleen, pancreas and adrenal glands are unremarkable. Enhanced kidneys are symmetrical in size. There is nonobstructive calcification in midpole of the right kidney measures 2.3 mm. At least 3 nonobstructive calcifications are noted within left kidney the largest in midpole measures 3.7 mm.  No hydronephrosis or hydroureter.  No aortic aneurysm.  Heart size within normal limits.  No pericardial effusion.  There is no pericecal inflammation.  Normal appendix.  There are mild fluid and gas distended small bowel loops in jejunum and left mid abdomen with some air-fluid levels. Findings suspicious for mild ileus or enteritis. Less likely  early small bowel obstruction. There is no evidence of transition point in caliber of small bowel.  No colonic obstruction. Small amount of free fluid noted within posterior cul-de-sac. The uterus and adnexa are unremarkable. Pelvic phleboliths are noted. Urinary bladder is unremarkable. Tiny umbilical hernia containing fat without evidence of acute complication.  IMPRESSION: 1. There are mild fluid and gas distended small bowel loops in jejunum and left mid abdomen with some air-fluid levels. Findings suspicious for enteritis or ileus. Less likely early small bowel obstruction. There is no evidence of transition point in caliber of small bowel. Clinical correlation is necessary. 2. Normal appendix.  No pericecal inflammation. 3. No colonic obstruction. 4. Bilateral nonobstructive nephrolithiasis. No hydronephrosis or hydroureter. 5. Small amount of pelvic free fluid posterior cul-de-sac.   Electronically Signed   By: Natasha Mead M.D.   On: 03/03/2015 15:28    EKG: Independently reviewed. SB at 50 bpm versus? Junctional rhythm and no acute findings.  Assessment/Plan Principal Problem:   Enteritis Active Problems:   Hypokalemia   Ileus   Abdominal pain   Headache   Enteritis with possible small bowel ileus. - Patient's abdominal pain and nausea possibly related to this - SBO less likely. - ? Food poisoning - Treat supportively with bowel rest/clear liquids, IV fluids, antiemetics, PPI and pain medications - KUB in a.m. - Advance diet as tolerated.  Hypotension - Likely related to problem #1 - Continue hydration with IV fluids - Improving.  Hypokalemia - Replace and follow BMP  Near syncope - May be related to hypotension and vasovagal - Monitor on telemetry  Headache - May be constitutional related to problem #1 - No focal deficits or meningeal signs - Improving  Lactic acidosis - Likely related to hypotension. Aggressively hydrated with IV fluids. Follow lactate.     Code  Status: Full  Family Communication: None at bedside  Disposition Plan: DC home possibly 7/14 when medically stable   Time spent: 50 minutes  Edna Rede, MD, FACP, FHM. [facility] Pager [phone]  If 7PM-7AM, please contact night-coverage [email or web address] Password [facility] 03/03/2015, 6:03 PM

## 2015-03-03 NOTE — ED Notes (Addendum)
Pt reports onset of mid and lower abd pain last night and severe headache. Having nausea, denies diarrhea or recent fever. Appears pale at triage, reports feeling very weak and near syncopal episode on arrival.

## 2015-03-04 ENCOUNTER — Observation Stay (HOSPITAL_COMMUNITY): Payer: Self-pay

## 2015-03-04 DIAGNOSIS — K529 Noninfective gastroenteritis and colitis, unspecified: Secondary | ICD-10-CM

## 2015-03-04 DIAGNOSIS — R1084 Generalized abdominal pain: Secondary | ICD-10-CM

## 2015-03-04 DIAGNOSIS — K5900 Constipation, unspecified: Secondary | ICD-10-CM | POA: Diagnosis present

## 2015-03-04 LAB — BASIC METABOLIC PANEL
Anion gap: 5 (ref 5–15)
BUN: <5 mg/dL — ABNORMAL LOW (ref 6–20)
CO2: 24 mmol/L (ref 22–32)
CREATININE: 0.54 mg/dL (ref 0.44–1.00)
Calcium: 8.4 mg/dL — ABNORMAL LOW (ref 8.9–10.3)
Chloride: 112 mmol/L — ABNORMAL HIGH (ref 101–111)
GFR calc Af Amer: >60 mL/min (ref >60)
GFR calc non Af Amer: >60 mL/min (ref >60)
Glucose, Bld: 97 mg/dL (ref 65–99)
POTASSIUM: 4 mmol/L (ref 3.5–5.1)
SODIUM: 141 mmol/L (ref 135–145)

## 2015-03-04 LAB — LACTIC ACID, PLASMA: Lactic Acid, Venous: 1.3 mmol/L (ref 0.5–2.0)

## 2015-03-04 LAB — TSH: TSH: 0.434 u[IU]/mL (ref 0.350–4.500)

## 2015-03-04 MED ORDER — BISACODYL 5 MG PO TBEC: 5.0000 mg | DELAYED_RELEASE_TABLET | Freq: Every day | ORAL | Status: DC | PRN

## 2015-03-04 MED ORDER — BISACODYL 5 MG PO TBEC
5.0000 mg | DELAYED_RELEASE_TABLET | ORAL | Status: AC
  Administered 2015-03-04: 5 mg via ORAL
  Filled 2015-03-04: qty 1

## 2015-03-04 MED ORDER — POLYETHYLENE GLYCOL 3350 17 G PO PACK
17.0000 g | PACK | Freq: Every day | ORAL | Status: DC
  Administered 2015-03-04: 17 g via ORAL

## 2015-03-04 NOTE — Discharge Summary (Signed)
Discharge Summary  [name] Bass ZOX:[id number] DOB: 12/22/1985  PCP: No PCP.  Admit date: 03/03/2015 Discharge date: 03/04/2015  Time spent: 25 minutes  Recommendations for Outpatient Follow-up:  1. New medication: Dulcolax tablet as needed for bowel movement every 24 hours  Discharge Diagnoses:  Active Hospital Problems   Diagnosis Date Noted  . Enteritis 03/03/2015  . Constipation 03/04/2015  . Hypokalemia 03/03/2015  . Abdominal pain 03/03/2015  . Headache 03/03/2015    Resolved Hospital Problems   Diagnosis Date Noted Date Resolved  No resolved problems to display.    Discharge Condition: Improved, being discharged home  Diet recommendation: Regular  Filed Weights   03/03/15 1253  Weight: 70.308 kg (155 lb)    History of present illness:  29 year old female with no significant past medical history admitted on 7/13 for one-day history of generalized abdominal pain with severe nausea. She appears to have been in good health. Patient unable to eat anything. She came to the emergency room was noted to have normal renal function, but slightly hypokalemic with a potassium of 3.3. Abdominal x-ray done noted no evidence of obstruction, but did note moderate stool burden. Patient last had a small bowel movement several days prior, but states that her normal baseline standard bowel movement every 4-5 days.  Hospital Course:  Principal Problem:   Enteritis causing hypokalemia and generalized abdominal pain: Patient treated as an enteritis. IV fluids. Labs were otherwise stable with normal white count and no fever. By 7/14, patient feeling much better. Potassium levels normal. Her diet was advanced and she was able to tolerate solid food and discharged home. Underlying issue may be chronic constipation: Active Problems:   Stye: Patient woke up on 7/14 with some mild swelling of her right eyelid. No erythema. No left eyelid involvement. On examination, not really too  tender.  No evidence of allergy. Improved with warm compresses.    Constipation: Patient tried on MiraLAX with no results. Given Dulcolax tablet which helped. We'll discharge patient [facility]    Procedures:  None  Consultations:  None  Discharge Exam: BP 111/73 mmHg  Pulse 78  Temp(Src) 98.7 F (37.1 C) (Oral)  Resp 18  Ht 5\' 10"  (1.778 m)  Wt 70.308 kg (155 lb)  BMI 22.24 kg/m2  SpO2 98%  LMP 02/17/2015  General: Alert and oriented 3, no acute distress Cardiovascular: Regular rate and rhythm, S1-S2 Respiratory: Clear to auscultation bilaterally  Discharge Instructions You were cared for by a hospitalist during your hospital stay. If you have any questions about your discharge medications or the care you received while you were in the hospital after you are discharged, you can call the unit and asked to speak with the hospitalist on call if the hospitalist that took care of you is not available. Once you are discharged, your primary care physician will handle any further medical issues. Please note that NO REFILLS for any discharge medications will be authorized once you are discharged, as it is imperative that you return to your primary care physician (or establish a relationship with a primary care physician if you do not have one) for your aftercare needs so that they can reassess your need for medications and monitor your lab values.     Medication List    STOP taking these medications        ibuprofen 200 MG tablet  Commonly known as:  ADVIL,MOTRIN      TAKE these medications  bisacodyl 5 MG EC tablet  Commonly known as:  DULCOLAX  Take 1 tablet (5 mg total) by mouth daily as needed (Need to have a bowel movement every 24 hours).       Allergies  Allergen Reactions  . Amoxicillin Rash      The results of significant diagnostics from this hospitalization (including imaging, microbiology, ancillary and laboratory) are listed  below for reference.    Significant Diagnostic Studies: Ct Abdomen Pelvis W Contrast  03/03/2015   CLINICAL DATA:  Mid and lower abdominal pain starting last night, headache, nausea, weakness  EXAM: CT ABDOMEN AND PELVIS WITH CONTRAST  TECHNIQUE: Multidetector CT imaging of the abdomen and pelvis was performed using the standard protocol following bolus administration of intravenous contrast.  CONTRAST:  OMNIPAQUE IOHEXOL 300 MG/ML  SOLN  COMPARISON:  03/28/2011  FINDINGS: The lung bases are unremarkable. Sagittal images of the spine are unremarkable. Enhanced liver shows no focal mass. Status post cholecystectomy. The spleen, pancreas and adrenal glands are unremarkable. Enhanced kidneys are symmetrical in size. There is nonobstructive calcification in midpole of the right kidney measures 2.3 mm. At least 3 nonobstructive calcifications are noted within left kidney the largest in midpole measures 3.7 mm.  No hydronephrosis or hydroureter.  No aortic aneurysm.  Heart size within normal limits.  No pericardial effusion.  There is no pericecal inflammation.  Normal appendix.  There are mild fluid and gas distended small bowel loops in jejunum and left mid abdomen with some air-fluid levels. Findings suspicious for mild ileus or enteritis. Less likely early small bowel obstruction. There is no evidence of transition point in caliber of small bowel.  No colonic obstruction. Small amount of free fluid noted within posterior cul-de-sac. The uterus and adnexa are unremarkable. Pelvic phleboliths are noted. Urinary bladder is unremarkable. Tiny umbilical hernia containing fat without evidence of acute complication.  IMPRESSION: 1. There are mild fluid and gas distended small bowel loops in jejunum and left mid abdomen with some air-fluid levels. Findings suspicious for enteritis or ileus. Less likely early small bowel obstruction. There is no evidence of transition point in caliber of small bowel. Clinical  correlation is necessary. 2. Normal appendix.  No pericecal inflammation. 3. No colonic obstruction. 4. Bilateral nonobstructive nephrolithiasis. No hydronephrosis or hydroureter. 5. Small amount of pelvic free fluid posterior cul-de-sac.   Electronically Signed   By: Natasha Mead M.D.   On: 03/03/2015 15:28   Dg Abd 2 Views  03/04/2015   CLINICAL DATA:  Lower abdominal pain and nausea for 24 hours. Evaluate for ileus.  EXAM: ABDOMEN - 2 VIEW  COMPARISON:  CT abdomen pelvis - 03/03/2015  FINDINGS: Moderate colonic stool burden without evidence of obstruction. No pneumoperitoneum, pneumatosis or portal venous gas.  There is a punctate (approximately 3 mm) opacity overlying expected location of the expected location of the left renal fossa which likely correlates with the nonobstructing stones seen on preceding abdominal CT. Known punctate (approximately 2 mm) nonobstructing right-sided renal stone is not well demonstrated.  Multiple phleboliths overlie the lower pelvis bilaterally, left greater than right.  Limited visualization of lower thorax is normal.  No acute osseus abnormalities.  IMPRESSION: 1. Moderate colonic stool burden without evidence of enteric obstruction or ileus. 2. Suspected left-sided nephrolithiasis as demonstrated on preceding abdominal CT.   Electronically Signed   By: Simonne Come M.D.   On: 03/04/2015 07:51    Microbiology: No results found for this or any previous visit (from the past  240 hour(s)).   Labs: Basic Metabolic Panel:  Recent Labs Lab 03/03/15 1304 03/04/15 0530  NA 138 141  K 3.3* 4.0  CL 108 112*  CO2 21* 24  GLUCOSE 133* 97  BUN 8 <5*  CREATININE 0.58 0.54  CALCIUM 9.1 8.4*   Liver Function Tests:  Recent Labs Lab 03/03/15 1304  AST 16  ALT 15  ALKPHOS 51  BILITOT 1.0  PROT 6.7  ALBUMIN 4.1    Recent Labs Lab 03/03/15 1304  LIPASE 22   No results for input(s): AMMONIA in the last 168 hours. CBC:  Recent Labs Lab 03/03/15 1304  WBC  10.3  HGB 14.1  HCT 40.2  MCV 88.4  PLT 228   Cardiac Enzymes: No results for input(s): CKTOTAL, CKMB, CKMBINDEX, TROPONINI in the last 168 hours. BNP: BNP (last 3 results) No results for input(s): BNP in the last 8760 hours.  ProBNP (last 3 results) No results for input(s): PROBNP in the last 8760 hours.  CBG:  Recent Labs Lab 03/03/15 1317  GLUCAP 134*       Signed:  Kennadie Brenner K  [facility] 03/04/2015, 2:51 PM

## 2015-03-04 NOTE — Progress Notes (Signed)
Pt provided with discharge instruction including information on follow up appointments and new medications. Pt verbalized understanding of all information. IV was dc'd without complication. Pt refused wheelchair and was escorted out via NT.   Carrie MewJasmine Rapheal Masso, RN

## 2015-03-28 ENCOUNTER — Emergency Department (HOSPITAL_COMMUNITY): Payer: Self-pay

## 2015-03-28 ENCOUNTER — Encounter (HOSPITAL_COMMUNITY): Payer: Self-pay | Admitting: Emergency Medicine

## 2015-03-28 ENCOUNTER — Emergency Department (HOSPITAL_COMMUNITY)
Admission: EM | Admit: 2015-03-28 | Discharge: 2015-03-29 | Disposition: A | Discharge status: Home / Self Care | Payer: Self-pay | Attending: Emergency Medicine | Admitting: Emergency Medicine

## 2015-03-28 DIAGNOSIS — N189 Chronic kidney disease, unspecified: Secondary | ICD-10-CM | POA: Insufficient documentation

## 2015-03-28 DIAGNOSIS — M25512 Pain in left shoulder: Secondary | ICD-10-CM | POA: Insufficient documentation

## 2015-03-28 DIAGNOSIS — Z8619 Personal history of other infectious and parasitic diseases: Secondary | ICD-10-CM | POA: Insufficient documentation

## 2015-03-28 DIAGNOSIS — Z8639 Personal history of other endocrine, nutritional and metabolic disease: Secondary | ICD-10-CM | POA: Insufficient documentation

## 2015-03-28 DIAGNOSIS — Z87440 Personal history of urinary (tract) infections: Secondary | ICD-10-CM | POA: Insufficient documentation

## 2015-03-28 DIAGNOSIS — Z8742 Personal history of other diseases of the female genital tract: Secondary | ICD-10-CM | POA: Insufficient documentation

## 2015-03-28 NOTE — ED Provider Notes (Signed)
CSN: [id number]     Arrival date & time 03/28/15  2219 History  This chart was scribed for non-physician practitioner Ebbie Ridge, PA-C working with Lavera Guise, MD by Leone Payor, ED Scribe. This patient was seen in room WTR7/WTR7 and the patient's care was started at 10:46 PM.    No chief complaint on file.  The history is provided by the patient. No language interpreter was used.     HPI Comments: [name] Bass is a 29 y.o. female who presents to the Emergency Department complaining of a few hours of constant, unchanged left shoulder pain that radiates down the back of the arm. Patient states she was leaning on her LUE when the left shoulder became dislocated. She states she was able to reduce the joint but with subsequent pain. 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 She denies numbness, weakness.   Past Medical History  Diagnosis Date  . Abnormal Pap smear   . Polycystic ovarian syndrome 05/08/05  . 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 CIN I (cervical intraepithelial neoplasia I)   . Herpes simplex without mention of complication   . FHx: suicide   . H/O amenorrhea 02/19/04  . Vaginitis and vulvovaginitis 04/29/04  . Bacterial vaginosis 04/29/04  . Dysuria 04/04/05  . Irregular periods/menstrual cycles   . Hx: UTI (urinary tract infection)   . Dyspareunia 05/02/10  . Herpes simplex type 2 infection     "last outbreak 3 weeks ago"  . Infection     UTI  . Chronic kidney disease     kidney stones  . Vaginal Pap smear, abnormal    Past Surgical History  Procedure Laterality Date  . Cholecystectomy, laparoscopic  2010  . Wisdom tooth extraction  2001  . Tonsillectomy  2006  . Cystoscopy with ureteroscopy  03/28/2012    Procedure: CYSTOSCOPY WITH URETEROSCOPY;  Surgeon: Marcine Matar, MD;  Location: [facility];  Service: Urology;  Laterality: Right;  RIGHT URETEROSCOPIC STONE EXTRACTION W/ LASER (Pt is [redacted] wk pregnant)    . Stone extraction with  basket  03/28/2012    Procedure: STONE EXTRACTION WITH BASKET;  Surgeon: Marcine Matar, MD;  Location: [facility];  Service: Urology;  Laterality: Right;  . Tubal ligation  08/04/2012    Procedure: POST PARTUM TUBAL LIGATION;  Surgeon: Hal Morales, MD;  Location: [facility];  Service: Gynecology;  Laterality: Bilateral;  Bilateral post partum tubal ligation   Family History  Problem Relation Age of Onset  . Heart disease Father     heart attack in oct.  lm  . Cancer Maternal Uncle     bone cancer  . Diabetes Maternal Uncle   . Cancer Maternal Grandmother     ovarian cancer  . Suicidality Mother   . Diabetes Maternal Aunt   . Cancer Maternal Aunt     lung  . Hypertension Paternal Uncle    History  Substance Use Topics  . Smoking status: Former Games developer  . Smokeless tobacco: Never Used     Comment: Jan 2015  . Alcohol Use: 0.0 oz/week    0 Glasses of wine per week     Comment: occasional   OB History    Gravida Para Term Preterm AB TAB SAB Ectopic Multiple Living   3 3 3       3      Review of Systems  Musculoskeletal: Positive for myalgias and arthralgias (left shoulder). Negative for joint swelling.  Neurological: Negative for weakness and numbness.      Allergies  Amoxicillin  Home Medications   Prior to Admission medications   Medication Sig Start Date End Date Taking? Authorizing Provider  bisacodyl (DULCOLAX) 5 MG EC tablet Take 1 tablet (5 mg total) by mouth daily as needed (Need to have a bowel movement every 24 hours). 03/04/15   Hollice Espy, MD   BP 142/66 mmHg  Pulse 59  Temp(Src) 98.3 F (36.8 C) (Oral)  Resp 18  Ht  (1.778 m)  Wt 155 lb (70.308 kg)  BMI 22.24 kg/m2  SpO2 100%  LMP 02/17/2015 Physical Exam  Constitutional: She is oriented to person, place, and time. She appears well-developed and well-nourished.  HENT:  Head: Normocephalic and atraumatic.  Cardiovascular: Normal rate.   Pulmonary/Chest: Effort normal.  Abdominal: She  exhibits no distension.  Musculoskeletal: She exhibits tenderness.  Tender over the anterior and posterior aspect of the left shoulder.   Neurological: She is alert and oriented to person, place, and time.  Skin: Skin is warm and dry.  Psychiatric: She has a normal mood and affect.  Nursing note and vitals reviewed.   ED Course  Procedures (including critical care time)  DIAGNOSTIC STUDIES: Oxygen Saturation is 100% on RA, normal by my interpretation.    COORDINATION OF CARE: 10:50 PM  Will order imaging of left shoulder. Discussed treatment plan with pt at bedside and pt agreed to plan.   Labs Review Labs Reviewed - No data to display  Imaging Review Dg Shoulder Left  03/29/2015   CLINICAL DATA:  Left shoulder pain; felt pop.  Initial encounter.  EXAM: LEFT SHOULDER - 2+ VIEW  COMPARISON:  None.  FINDINGS: There is no evidence of fracture or dislocation. The left humeral head is seated within the glenoid fossa. The acromioclavicular joint is unremarkable in appearance. No significant soft tissue abnormalities are seen. The visualized portions of the left lung are clear.  IMPRESSION: No evidence of fracture or dislocation.   Electronically Signed   By: Roanna Raider M.D.   On: 03/29/2015 00:11    I personally performed the services described in this documentation, which was scribed in my presence. The recorded information has been reviewed and is accurate.     Charlestine Night, PA-C 03/30/15 [id number]  Lavera Guise, MD 03/30/15 [phone]

## 2015-03-28 NOTE — ED Notes (Signed)
Pt report left shoulder pain, sts it was dislocated but she put id back lining on it. No obvious deformity noticed.

## 2015-03-29 MED ORDER — HYDROCODONE-ACETAMINOPHEN 5-325 MG PO TABS: 1.0000 | ORAL_TABLET | Freq: Four times a day (QID) | ORAL | Status: DC | PRN

## 2015-03-29 NOTE — Discharge Instructions (Signed)
Return here as needed.  Follow-up with the orthopedist provided.  Use ice on your shoulder, wear the sling until you follow up

## 2016-04-03 ENCOUNTER — Encounter (HOSPITAL_COMMUNITY): Payer: Self-pay | Admitting: Emergency Medicine

## 2016-04-03 ENCOUNTER — Emergency Department (HOSPITAL_COMMUNITY)
Admission: EM | Admit: 2016-04-03 | Discharge: 2016-04-03 | Disposition: A | Discharge status: Home / Self Care | Payer: Self-pay | Attending: Emergency Medicine | Admitting: Emergency Medicine

## 2016-04-03 DIAGNOSIS — R51 Headache: Secondary | ICD-10-CM | POA: Insufficient documentation

## 2016-04-03 DIAGNOSIS — Z79891 Long term (current) use of opiate analgesic: Secondary | ICD-10-CM | POA: Insufficient documentation

## 2016-04-03 DIAGNOSIS — Z87891 Personal history of nicotine dependence: Secondary | ICD-10-CM | POA: Insufficient documentation

## 2016-04-03 DIAGNOSIS — R509 Fever, unspecified: Secondary | ICD-10-CM | POA: Insufficient documentation

## 2016-04-03 DIAGNOSIS — M791 Myalgia: Secondary | ICD-10-CM | POA: Insufficient documentation

## 2016-04-03 DIAGNOSIS — R519 Headache, unspecified: Secondary | ICD-10-CM

## 2016-04-03 MED ORDER — KETOROLAC TROMETHAMINE 30 MG/ML IJ SOLN
30.0000 mg | Freq: Once | INTRAMUSCULAR | Status: AC
  Administered 2016-04-03: 30 mg via INTRAVENOUS
  Filled 2016-04-03: qty 1

## 2016-04-03 MED ORDER — DOXYCYCLINE HYCLATE 100 MG PO CAPS: 100.0000 mg | ORAL_CAPSULE | Freq: Two times a day (BID) | ORAL | 0 refills | Status: DC

## 2016-04-03 MED ORDER — DEXAMETHASONE SODIUM PHOSPHATE 10 MG/ML IJ SOLN
10.0000 mg | Freq: Once | INTRAMUSCULAR | Status: AC
  Administered 2016-04-03: 10 mg via INTRAVENOUS
  Filled 2016-04-03: qty 1

## 2016-04-03 MED ORDER — PROCHLORPERAZINE EDISYLATE 5 MG/ML IJ SOLN
10.0000 mg | Freq: Once | INTRAMUSCULAR | Status: AC
  Administered 2016-04-03: 10 mg via INTRAVENOUS
  Filled 2016-04-03: qty 2

## 2016-04-03 MED ORDER — DIPHENHYDRAMINE HCL 50 MG/ML IJ SOLN
25.0000 mg | Freq: Once | INTRAMUSCULAR | Status: AC
  Administered 2016-04-03: 25 mg via INTRAVENOUS
  Filled 2016-04-03: qty 1

## 2016-04-03 MED ORDER — SODIUM CHLORIDE 0.9 % IV BOLUS (SEPSIS)
1000.0000 mL | Freq: Once | INTRAVENOUS | Status: AC
  Administered 2016-04-03: 1000 mL via INTRAVENOUS

## 2016-04-03 NOTE — ED Provider Notes (Signed)
[facility]-EMERGENCY DEPT Provider Note   CSN: [id number] Arrival date & time: 04/03/16  1148     History   Chief Complaint Chief Complaint  Patient presents with  . Migraine    HPI [name] Bass is a 30 y.o. female.  30 yo F with a chief complaint of a headache and myalgias. Going on for the past week. Has had some decreased oral intake. Denies head injury.  She is having some subjective fevers. Denies neck stiffness. Denies tick bites. Denies rash. Denies sick contacts. Denies cough congestion fevers chest pain shortness breath abdominal pain.   The history is provided by the patient.  Migraine  This is a new problem. The current episode started more than 1 week ago. The problem occurs constantly. The problem has not changed since onset.Pertinent negatives include no chest pain, no headaches and no shortness of breath. Nothing aggravates the symptoms. Nothing relieves the symptoms. She has tried nothing for the symptoms. The treatment provided no relief.    Past Medical History:  Diagnosis Date  . Abnormal Pap smear   . Bacterial vaginosis 04/29/04  . Chronic kidney disease    kidney stones  . CIN I (cervical intraepithelial neoplasia I)   . Dyspareunia 05/02/10  . Dysuria 04/04/05  . FHx: suicide   . H/O amenorrhea 02/19/04  . Herpes simplex type 2 infection    "last outbreak 3 weeks ago"  . Herpes simplex without mention of complication   . 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 Hx: UTI (urinary tract infection)   . Infection    UTI  . Irregular periods/menstrual cycles   . Polycystic ovarian syndrome 05/08/05  . Vaginal Pap smear, abnormal   . Vaginitis and vulvovaginitis 04/29/04    Patient Active Problem List   Diagnosis Date Noted  . Constipation 03/04/2015  . Enteritis 03/03/2015  . Hypokalemia 03/03/2015  . Ileus (HCC) 03/03/2015  . Abdominal pain 03/03/2015  . Headache 03/03/2015  . Vaginal delivery 08/04/2012  . Bilateral kidney stones 07/08/2012  .  Abnormal glucose tolerance test in pregnancy, antepartum 07/08/2012  . Unsure of LMP  07/08/2012  . Normal pregnancy 01/17/2012  . Abnormal Pap smear   . Polycystic ovarian syndrome   . CIN I (cervical intraepithelial neoplasia I)   . Herpes simplex without mention of complication   . FHx: suicide   . HSV (herpes simplex virus) anogenital infection 12/02/2011  . High risk HPV infection 11/28/2011  . History of PCOS 11/28/2011  . Cervical intraepithelial neoplasia I 11/28/2011  . Lower abdominal pain 02/28/2011    Class: Acute    Past Surgical History:  Procedure Laterality Date  . CHOLECYSTECTOMY, LAPAROSCOPIC  2010  . CYSTOSCOPY WITH URETEROSCOPY  03/28/2012   Procedure: CYSTOSCOPY WITH URETEROSCOPY;  Surgeon: Marcine MatarStephen Dahlstedt, MD;  Location: [facility];  Service: Urology;  Laterality: Right;  RIGHT URETEROSCOPIC STONE EXTRACTION W/ LASER (Pt is [redacted] wk pregnant)    . STONE EXTRACTION WITH BASKET  03/28/2012   Procedure: STONE EXTRACTION WITH BASKET;  Surgeon: Marcine MatarStephen Dahlstedt, MD;  Location: [facility];  Service: Urology;  Laterality: Right;  . TONSILLECTOMY  2006  . TUBAL LIGATION  08/04/2012   Procedure: POST PARTUM TUBAL LIGATION;  Surgeon: Hal MoralesVanessa P Haygood, MD;  Location: [facility];  Service: Gynecology;  Laterality: Bilateral;  Bilateral post partum tubal ligation  . WISDOM TOOTH EXTRACTION  2001    OB History    Gravida Para Term Preterm AB Living   3 3 3  3   SAB TAB Ectopic Multiple Live Births           3       Home Medications    Prior to Admission medications   Medication Sig Start Date End Date Taking? Authorizing Provider  acetaminophen (TYLENOL) 500 MG tablet Take 1,000 mg by mouth every 6 (six) hours as needed for mild pain, moderate pain or headache.   Yes Historical Provider, MD  doxycycline (VIBRAMYCIN) 100 MG capsule Take 1 capsule (100 mg total) by mouth 2 (two) times daily. One po bid x 7 days 04/03/16   Melene Planan Tomasina Keasling, DO    Family History Family History    Problem Relation Age of Onset  . Heart disease Father     heart attack in oct.  lm  . Cancer Maternal Uncle     bone cancer  . Diabetes Maternal Uncle   . Cancer Maternal Grandmother     ovarian cancer  . Suicidality Mother   . Diabetes Maternal Aunt   . Cancer Maternal Aunt     lung  . Hypertension Paternal Uncle     Social History Social History  Substance Use Topics  . Smoking status: Former Games developermoker  . Smokeless tobacco: Never Used     Comment: Jan 2015  . Alcohol use 0.0 oz/week     Comment: occasional     Allergies   Amoxicillin   Review of Systems Review of Systems  Constitutional: Positive for fever (subjective). Negative for chills.  HENT: Negative for congestion and rhinorrhea.   Eyes: Negative for redness and visual disturbance.  Respiratory: Negative for shortness of breath and wheezing.   Cardiovascular: Negative for chest pain and palpitations.  Gastrointestinal: Negative for nausea and vomiting.  Genitourinary: Negative for dysuria and urgency.  Musculoskeletal: Positive for arthralgias and myalgias.  Skin: Negative for pallor and wound.  Neurological: Negative for dizziness and headaches.     Physical Exam Updated Vital Signs BP 124/82 (BP Location: Left Arm)   Pulse 91   Temp 98.5 F (36.9 C) (Oral)   Resp 12   Ht 5\' 10"  (1.778 m)   Wt 165 lb (74.8 kg)   LMP 04/02/2016   SpO2 100%   BMI 23.68 kg/m   Physical Exam  Constitutional: She is oriented to person, place, and time. She appears well-developed and well-nourished. No distress.  HENT:  Head: Normocephalic and atraumatic.  Eyes: EOM are normal. Pupils are equal, round, and reactive to light.  Neck: Normal range of motion. Neck supple.  Cardiovascular: Normal rate and regular rhythm.  Exam reveals no gallop and no friction rub.   No murmur heard. Pulmonary/Chest: Effort normal. She has no wheezes. She has no rales.  Abdominal: Soft. She exhibits no distension. There is no  tenderness.  Musculoskeletal: She exhibits no edema or tenderness.  Neurological: She is alert and oriented to person, place, and time. She has normal strength. No cranial nerve deficit or sensory deficit. She displays a negative Romberg sign. Coordination and gait normal. GCS eye subscore is 4. GCS verbal subscore is 5. GCS motor subscore is 6. She displays no Babinski's sign on the right side. She displays no Babinski's sign on the left side.  Reflex Scores:      Tricep reflexes are 2+ on the right side and 2+ on the left side.      Bicep reflexes are 2+ on the right side and 2+ on the left side.      Brachioradialis reflexes  are 2+ on the right side and 2+ on the left side.      Patellar reflexes are 2+ on the right side and 2+ on the left side.      Achilles reflexes are 2+ on the right side and 2+ on the left side. No noted meningeal signs, able to rotate neck without significant pain.  Skin: Skin is warm and dry. She is not diaphoretic.  Psychiatric: She has a normal mood and affect. Her behavior is normal.  Nursing note and vitals reviewed.    ED Treatments / Results  Labs (all labs ordered are listed, but only abnormal results are displayed) Labs Reviewed - No data to display  EKG  EKG Interpretation None       Radiology No results found.  Procedures Procedures (including critical care time)  Medications Ordered in ED Medications  prochlorperazine (COMPAZINE) injection 10 mg (10 mg Intravenous Given 04/03/16 1340)  diphenhydrAMINE (BENADRYL) injection 25 mg (25 mg Intravenous Given 04/03/16 1340)  sodium chloride 0.9 % bolus 1,000 mL (0 mLs Intravenous Stopped 04/03/16 1443)  ketorolac (TORADOL) 30 MG/ML injection 30 mg (30 mg Intravenous Given 04/03/16 1340)  dexamethasone (DECADRON) injection 10 mg (10 mg Intravenous Given 04/03/16 1341)     Initial Impression / Assessment and Plan / ED Course  I have reviewed the triage vital signs and the nursing  notes.  Pertinent labs & imaging results that were available during my care of the patient were reviewed by me and considered in my medical decision making (see chart for details).  Clinical Course    30 yo F With a chief complaint of a headache with associated myalgias. Going on for about a week feels like her typical headache. With the myalgias concern for possible viral illness. Afebrile, but temp of 100.1. Patient has no noted rashes will treat presumptively for possible tick borne illness. Headache improved with migraine cocktail. Patient has no meningeal signs I see no need for lumbar puncture at this time.  3:23 PM:  I have discussed the diagnosis/risks/treatment options with the patient and believe the pt to be eligible for discharge home to follow-up with PCP. We also discussed returning to the ED immediately if new or worsening sx occur. We discussed the sx which are most concerning (e.g., sudden worsening pain, fever, inability to tolerate by mouth) that necessitate immediate return. Medications administered to the patient during their visit and any new prescriptions provided to the patient are listed below.  Medications given during this visit Medications  prochlorperazine (COMPAZINE) injection 10 mg (10 mg Intravenous Given 04/03/16 1340)  diphenhydrAMINE (BENADRYL) injection 25 mg (25 mg Intravenous Given 04/03/16 1340)  sodium chloride 0.9 % bolus 1,000 mL (0 mLs Intravenous Stopped 04/03/16 1443)  ketorolac (TORADOL) 30 MG/ML injection 30 mg (30 mg Intravenous Given 04/03/16 1340)  dexamethasone (DECADRON) injection 10 mg (10 mg Intravenous Given 04/03/16 1341)     The patient appears reasonably screen and/or stabilized for discharge and I doubt any other medical condition or other [facility] requiring further screening, evaluation, or treatment in the ED at this time prior to discharge.    Final Clinical Impressions(s) / ED Diagnoses   Final diagnoses:  Bad headache    New  Prescriptions Discharge Medication List as of 04/03/2016  2:33 PM    START taking these medications   Details  doxycycline (VIBRAMYCIN) 100 MG capsule Take 1 capsule (100 mg total) by mouth 2 (two) times daily. One po bid x 7 days, Starting  Mon 04/03/2016, Print         Melene Plan, DO 04/03/16 1524

## 2016-04-03 NOTE — ED Triage Notes (Signed)
Pt complaint of worsening headache over past past week unrelieved by motrin. Pt reports associated nausea and blurred vision.

## 2016-04-03 NOTE — Discharge Instructions (Signed)
Take tylenol 2 pills 4 times a day and motrin 4 pills 3 times a day.  Drink plenty of fluids.  Return for worsening shortness of breath, headache, confusion. Follow up with your family doctor.   

## 2016-11-05 ENCOUNTER — Emergency Department (HOSPITAL_COMMUNITY): Payer: Medicaid Other

## 2016-11-05 ENCOUNTER — Encounter (HOSPITAL_COMMUNITY): Payer: Self-pay

## 2016-11-05 ENCOUNTER — Emergency Department (HOSPITAL_COMMUNITY)
Admission: EM | Admit: 2016-11-05 | Discharge: 2016-11-06 | Disposition: A | Discharge status: Home / Self Care | Payer: Medicaid Other | Attending: Emergency Medicine | Admitting: Emergency Medicine

## 2016-11-05 DIAGNOSIS — N132 Hydronephrosis with renal and ureteral calculous obstruction: Secondary | ICD-10-CM | POA: Insufficient documentation

## 2016-11-05 DIAGNOSIS — R109 Unspecified abdominal pain: Secondary | ICD-10-CM | POA: Diagnosis present

## 2016-11-05 DIAGNOSIS — N189 Chronic kidney disease, unspecified: Secondary | ICD-10-CM | POA: Diagnosis not present

## 2016-11-05 DIAGNOSIS — Z87891 Personal history of nicotine dependence: Secondary | ICD-10-CM | POA: Diagnosis not present

## 2016-11-05 DIAGNOSIS — N200 Calculus of kidney: Secondary | ICD-10-CM

## 2016-11-05 LAB — CBC WITH DIFFERENTIAL/PLATELET
Basophils Absolute: 0 10*3/uL (ref 0.0–0.1)
Basophils Relative: 0 %
EOS PCT: 4 %
Eosinophils Absolute: 0.3 10*3/uL (ref 0.0–0.7)
HEMATOCRIT: 36.8 % (ref 36.0–46.0)
Hemoglobin: 13 g/dL (ref 12.0–15.0)
LYMPHS ABS: 2 10*3/uL (ref 0.7–4.0)
LYMPHS PCT: 23 %
MCH: 30.8 pg (ref 26.0–34.0)
MCHC: 35.3 g/dL (ref 30.0–36.0)
MCV: 87.2 fL (ref 78.0–100.0)
MONO ABS: 0.4 10*3/uL (ref 0.1–1.0)
Monocytes Relative: 5 %
NEUTROS ABS: 5.7 10*3/uL (ref 1.7–7.7)
Neutrophils Relative %: 68 %
PLATELETS: 275 10*3/uL (ref 150–400)
RBC: 4.22 MIL/uL (ref 3.87–5.11)
RDW: 12.4 % (ref 11.5–15.5)
WBC: 8.5 10*3/uL (ref 4.0–10.5)

## 2016-11-05 LAB — BASIC METABOLIC PANEL
Anion gap: 8 (ref 5–15)
BUN: 11 mg/dL (ref 6–20)
CHLORIDE: 109 mmol/L (ref 101–111)
CO2: 22 mmol/L (ref 22–32)
CREATININE: 0.68 mg/dL (ref 0.44–1.00)
Calcium: 9.2 mg/dL (ref 8.9–10.3)
Glucose, Bld: 96 mg/dL (ref 65–99)
Potassium: 3.7 mmol/L (ref 3.5–5.1)
SODIUM: 139 mmol/L (ref 135–145)

## 2016-11-05 LAB — URINALYSIS, ROUTINE W REFLEX MICROSCOPIC
BILIRUBIN URINE: NEGATIVE
Glucose, UA: NEGATIVE mg/dL
Ketones, ur: NEGATIVE mg/dL
Nitrite: NEGATIVE
Protein, ur: NEGATIVE mg/dL
SPECIFIC GRAVITY, URINE: 1.009 (ref 1.005–1.030)
pH: 6 (ref 5.0–8.0)

## 2016-11-05 LAB — PREGNANCY, URINE: Preg Test, Ur: NEGATIVE

## 2016-11-05 MED ORDER — OXYCODONE-ACETAMINOPHEN 5-325 MG PO TABS: 1.0000 | ORAL_TABLET | ORAL | 0 refills | Status: DC | PRN

## 2016-11-05 MED ORDER — HYDROMORPHONE HCL 1 MG/ML IJ SOLN
0.5000 mg | Freq: Once | INTRAMUSCULAR | Status: AC
  Administered 2016-11-05: 0.5 mg via INTRAVENOUS
  Filled 2016-11-05: qty 0.5

## 2016-11-05 MED ORDER — KETOROLAC TROMETHAMINE 30 MG/ML IJ SOLN
30.0000 mg | Freq: Once | INTRAMUSCULAR | Status: AC
  Administered 2016-11-05: 30 mg via INTRAVENOUS
  Filled 2016-11-05: qty 1

## 2016-11-05 MED ORDER — ONDANSETRON 4 MG PO TBDP: 4.0000 mg | ORAL_TABLET | Freq: Three times a day (TID) | ORAL | 0 refills | Status: DC | PRN

## 2016-11-05 MED ORDER — SODIUM CHLORIDE 0.9 % IV BOLUS (SEPSIS)
1000.0000 mL | Freq: Once | INTRAVENOUS | Status: AC
  Administered 2016-11-05: 1000 mL via INTRAVENOUS

## 2016-11-05 MED ORDER — ONDANSETRON HCL 4 MG/2ML IJ SOLN
4.0000 mg | Freq: Once | INTRAMUSCULAR | Status: AC
  Administered 2016-11-05: 4 mg via INTRAVENOUS
  Filled 2016-11-05: qty 2

## 2016-11-05 MED ORDER — HYDROMORPHONE HCL 1 MG/ML IJ SOLN
1.0000 mg | Freq: Once | INTRAMUSCULAR | Status: AC
  Administered 2016-11-05: 1 mg via INTRAVENOUS
  Filled 2016-11-05: qty 1

## 2016-11-05 NOTE — ED Provider Notes (Signed)
[facility] Provider Note   CSN: [id number] Arrival date & time: 11/05/16  1939     History   Chief Complaint Chief Complaint  Patient presents with  . Flank Pain    L    HPI [name] Bass is a 31 y.o. female with a history of kidney stones presenting with 2 days of intermittent left flank pain that feels just like prior episode of kidney stones years ago. Nausea but no vomiting. She is having difficulty describing the nature of the pain but states that it is not radiating has been now constant since 6 PM today. No relieving factors. Denies fever, chills, gross hematuria, dysuria, diarrhea, blood in her stool or any other symptoms.  HPI  Past Medical History:  Diagnosis Date  . Abnormal Pap smear   . Bacterial vaginosis 04/29/04  . Chronic kidney disease    kidney stones  . CIN I (cervical intraepithelial neoplasia I)   . Dyspareunia 05/02/10  . Dysuria 04/04/05  . FHx: suicide   . H/O amenorrhea 02/19/04  . Herpes simplex type 2 infection    "last outbreak 3 weeks ago"  . Herpes simplex without mention of complication   . 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 Hx: UTI (urinary tract infection)   . Infection    UTI  . Irregular periods/menstrual cycles   . Polycystic ovarian syndrome 05/08/05  . Vaginal Pap smear, abnormal   . Vaginitis and vulvovaginitis 04/29/04    Patient Active Problem List   Diagnosis Date Noted  . Constipation 03/04/2015  . Enteritis 03/03/2015  . Hypokalemia 03/03/2015  . Ileus (HCC) 03/03/2015  . Abdominal pain 03/03/2015  . Headache 03/03/2015  . Vaginal delivery 08/04/2012  . Bilateral kidney stones 07/08/2012  . Abnormal glucose tolerance test in pregnancy, antepartum 07/08/2012  . Unsure of LMP  07/08/2012  . Normal pregnancy 01/17/2012  . Abnormal Pap smear   . Polycystic ovarian syndrome   . CIN I (cervical intraepithelial neoplasia I)   . Herpes simplex without mention of complication   . FHx: suicide   . HSV  (herpes simplex virus) anogenital infection 12/02/2011  . High risk HPV infection 11/28/2011  . History of PCOS 11/28/2011  . Cervical intraepithelial neoplasia I 11/28/2011  . Lower abdominal pain 02/28/2011    Class: Acute    Past Surgical History:  Procedure Laterality Date  . CHOLECYSTECTOMY, LAPAROSCOPIC  2010  . CYSTOSCOPY WITH URETEROSCOPY  03/28/2012   Procedure: CYSTOSCOPY WITH URETEROSCOPY;  Surgeon: Marcine Matar, MD;  Location: [facility];  Service: Urology;  Laterality: Right;  RIGHT URETEROSCOPIC STONE EXTRACTION W/ LASER (Pt is [redacted] wk pregnant)    . STONE EXTRACTION WITH BASKET  03/28/2012   Procedure: STONE EXTRACTION WITH BASKET;  Surgeon: Marcine Matar, MD;  Location: [facility];  Service: Urology;  Laterality: Right;  . TONSILLECTOMY  2006  . TUBAL LIGATION  08/04/2012   Procedure: POST PARTUM TUBAL LIGATION;  Surgeon: Hal Morales, MD;  Location: [facility];  Service: Gynecology;  Laterality: Bilateral;  Bilateral post partum tubal ligation  . WISDOM TOOTH EXTRACTION  2001    OB History    Gravida Para Term Preterm AB Living   3 3 3     3    SAB TAB Ectopic Multiple Live Births           3       Home Medications    Prior to Admission medications   Medication Sig Start Date End Date Taking?  Authorizing Provider  acetaminophen (TYLENOL) 500 MG tablet Take 1,500 mg by mouth every 6 (six) hours as needed for mild pain, moderate pain or headache.    Yes Historical Provider, MD  ibuprofen (ADVIL,MOTRIN) 800 MG tablet Take 800 mg by mouth every 8 (eight) hours as needed (pain).   Yes Historical Provider, MD    Family History Family History  Problem Relation Age of Onset  . Heart disease Father     heart attack in oct.  lm  . Cancer Maternal Uncle     bone cancer  . Diabetes Maternal Uncle   . Cancer Maternal Grandmother     ovarian cancer  . Suicidality Mother   . Diabetes Maternal Aunt   . Cancer Maternal Aunt     lung  . Hypertension Paternal Uncle      Social History Social History  Substance Use Topics  . Smoking status: Former Games developermoker  . Smokeless tobacco: Never Used     Comment: Jan 2015  . Alcohol use 0.0 oz/week     Comment: occasional     Allergies   Amoxicillin   Review of Systems Review of Systems  Constitutional: Negative for chills and fever.  HENT: Negative for congestion, ear pain and sore throat.   Eyes: Negative for visual disturbance.  Respiratory: Negative for cough, chest tightness, shortness of breath, wheezing and stridor.   Cardiovascular: Negative for chest pain, palpitations and leg swelling.  Gastrointestinal: Positive for abdominal pain and nausea. Negative for abdominal distention, anal bleeding, blood in stool, diarrhea, rectal pain and vomiting.  Genitourinary: Positive for flank pain. Negative for difficulty urinating, dysuria, frequency, hematuria and pelvic pain.  Musculoskeletal: Positive for back pain. Negative for gait problem, neck pain and neck stiffness.  Skin: Negative for color change, pallor, rash and wound.  Neurological: Negative for dizziness, seizures, syncope, weakness, light-headedness and headaches.  Psychiatric/Behavioral: Negative for behavioral problems.     Physical Exam Updated Vital Signs BP (!) 143/111 (BP Location: Right Arm)   Pulse 82   Temp 98.1 F (36.7 C) (Oral)   Resp (!) 22   Ht 5\' 10"  (1.778 m)   Wt 81.6 kg   SpO2 99%   BMI 25.83 kg/m   Physical Exam  Constitutional: She appears well-developed and well-nourished. No distress.  Afebrile, appears in significant discomfort lying with knees bent to her chest on her right side  HENT:  Head: Normocephalic and atraumatic.  Eyes: Conjunctivae are normal.  Neck: Neck supple.  Cardiovascular: Normal rate, regular rhythm and normal heart sounds.   No murmur heard. Pulmonary/Chest: Effort normal and breath sounds normal. No respiratory distress.  Abdominal: Soft. She exhibits no distension. There is no  tenderness. There is no rebound and no guarding.  CVA tenderness   Musculoskeletal: She exhibits no edema.  Neurological: She is alert.  Skin: Skin is warm and dry. No rash noted. She is not diaphoretic. No erythema. No pallor.  Psychiatric: She has a normal mood and affect.  Nursing note and vitals reviewed.    ED Treatments / Results  Labs (all labs ordered are listed, but only abnormal results are displayed) Labs Reviewed  CBC WITH DIFFERENTIAL/PLATELET  BASIC METABOLIC PANEL  URINALYSIS, ROUTINE W REFLEX MICROSCOPIC  PREGNANCY, URINE    EKG  EKG Interpretation None       Radiology No results found.  Procedures Procedures (including critical care time)  Medications Ordered in ED Medications  sodium chloride 0.9 % bolus 1,000 mL (1,000 mLs Intravenous  New Bag/Given 11/05/16 2012)  ketorolac (TORADOL) 30 MG/ML injection 30 mg (30 mg Intravenous Given 11/05/16 2033)  ondansetron [facility]) injection 4 mg (4 mg Intravenous Given 11/05/16 2033)  HYDROmorphone (DILAUDID) injection 1 mg (1 mg Intravenous Given 11/05/16 2032)     Initial Impression / Assessment and Plan / ED Course  I have reviewed the triage vital signs and the nursing notes.  Pertinent labs & imaging results that were available during my care of the patient were reviewed by me and considered in my medical decision making (see chart for details).     Patient presents with left flank pain which feels the same as prior episode of nephrolithiasis. On exam she has CVA tenderness and is very uncomfortable appearing Ordered renal study. Pain and nausea managed in ED Given IV fluids, toradol, zofran Patient reported significant improvement on initial reassessment.  Nurse came to see me stating that patient was complaining of chest tightness, obtained EKG. She was comfortable appearing but pointed to her sternum feeling a pressure type of discomfort.  Normal sinus rhythm EKG. Patient was discussed with Dr.  Erma Heritage who agrees with assessment and plan.  Transferred patient care at end of shift to [place] still, PA-C pending CT renal study and urine analysis. Anticipate discharge home with pain management if dx of nephrolithiasis and follow up with urology. Treat as appropriate if other findings or change in condition.  Final Clinical Impressions(s) / ED Diagnoses   Final diagnoses:  Left flank pain    New Prescriptions New Prescriptions   No medications on file     Gregary Cromer 11/05/16 2243    Shaune Pollack, MD 11/06/16 [phone]

## 2016-11-05 NOTE — Discharge Instructions (Signed)
Drink plenty of fluids

## 2016-11-05 NOTE — ED Triage Notes (Signed)
Pt complaining of L flank pain. Hx kidney stones. Denies other urinary symptoms.

## 2016-11-05 NOTE — ED Provider Notes (Signed)
Patient signed out at end of shift by Mathews RobinsonsJessica Mitchell, PA-C, pending CT renal study in evaluation of left flank pain. She has a h/o of kidney stones. No fever.  CT shows a 6 mm stone in the ureteropelvic junction. She will need follow up with urology for further management. Re-evaluation finds the patient in mild pain, additional Dilaudid ordered. She is comfortable with discharge home. Discussed return precautions.   Elpidio AnisShari Deserie Dirks, PA-C 11/05/16 2347    Shaune Pollackameron Isaacs, MD 11/06/16 [phone]

## 2016-11-07 ENCOUNTER — Other Ambulatory Visit: Payer: Self-pay | Admitting: Urology

## 2016-11-08 ENCOUNTER — Encounter (HOSPITAL_COMMUNITY): Payer: Self-pay

## 2016-11-09 ENCOUNTER — Encounter (HOSPITAL_COMMUNITY)
Admission: RE | Disposition: A | Discharge status: Home / Self Care | Payer: Self-pay | Source: Ambulatory Visit | Attending: Urology

## 2016-11-09 ENCOUNTER — Ambulatory Visit (HOSPITAL_COMMUNITY): Payer: Medicaid Other

## 2016-11-09 ENCOUNTER — Encounter (HOSPITAL_COMMUNITY): Payer: Self-pay | Admitting: *Deleted

## 2016-11-09 ENCOUNTER — Ambulatory Visit (HOSPITAL_COMMUNITY)
Admission: RE | Admit: 2016-11-09 | Discharge: 2016-11-09 | Disposition: A | Discharge status: Home / Self Care | Payer: Medicaid Other | Source: Ambulatory Visit | Attending: Urology | Admitting: Urology

## 2016-11-09 DIAGNOSIS — N201 Calculus of ureter: Secondary | ICD-10-CM | POA: Insufficient documentation

## 2016-11-09 HISTORY — PX: EXTRACORPOREAL SHOCK WAVE LITHOTRIPSY: SHX1557

## 2016-11-09 LAB — PREGNANCY, URINE: Preg Test, Ur: NEGATIVE

## 2016-11-09 SURGERY — LITHOTRIPSY, ESWL
Anesthesia: LOCAL | Laterality: Left

## 2016-11-09 MED ORDER — DIAZEPAM 5 MG PO TABS
10.0000 mg | ORAL_TABLET | ORAL | Status: AC
  Administered 2016-11-09: 10 mg via ORAL
  Filled 2016-11-09: qty 2

## 2016-11-09 MED ORDER — HYDROMORPHONE HCL 1 MG/ML IJ SOLN
0.5000 mg | INTRAMUSCULAR | Status: AC | PRN
  Administered 2016-11-09 (×2): 0.5 mg via INTRAVENOUS

## 2016-11-09 MED ORDER — CIPROFLOXACIN HCL 500 MG PO TABS
500.0000 mg | ORAL_TABLET | ORAL | Status: AC
  Administered 2016-11-09: 500 mg via ORAL
  Filled 2016-11-09: qty 1

## 2016-11-09 MED ORDER — DIPHENHYDRAMINE HCL 25 MG PO CAPS
25.0000 mg | ORAL_CAPSULE | ORAL | Status: AC
  Administered 2016-11-09: 25 mg via ORAL
  Filled 2016-11-09: qty 1

## 2016-11-09 MED ORDER — SODIUM CHLORIDE 0.9 % IV SOLN
INTRAVENOUS | Status: DC
  Administered 2016-11-09 (×2): via INTRAVENOUS

## 2016-11-09 MED ORDER — HYDROMORPHONE HCL 1 MG/ML IJ SOLN
INTRAMUSCULAR | Status: AC
  Administered 2016-11-09: 0.5 mg via INTRAVENOUS
  Filled 2016-11-09: qty 1

## 2016-11-09 MED ORDER — OXYCODONE HCL 5 MG PO TABS
10.0000 mg | ORAL_TABLET | Freq: Once | ORAL | Status: AC | PRN
  Administered 2016-11-09: 10 mg via ORAL
  Filled 2016-11-09: qty 2

## 2016-11-09 NOTE — Progress Notes (Signed)
Patient received 0.5 of Dilaudid at 1523 with relief. Sleeping now. Sats 98%, Respirations 20, HR 67. Friend at bedside. NS infusing via iv. Will closely monitor patient and reassess pain when she awakes.

## 2016-11-09 NOTE — Progress Notes (Signed)
Pt ambulated down hallway and back with assistance prior to discharge. Tolerated well.

## 2016-11-09 NOTE — Discharge Instructions (Signed)
Have someone with you for 24 hours. Call  MD for any problems or questions. Call 911 or go to ER for emergencies.    Moderate Conscious Sedation, Adult, Care After These instructions provide you with information about caring for yourself after your procedure. Your health care provider may also give you more specific instructions. Your treatment has been planned according to current medical practices, but problems sometimes occur. Call your health care provider if you have any problems or questions after your procedure. What can I expect after the procedure? After your procedure, it is common:  To feel sleepy for several hours.  To feel clumsy and have poor balance for several hours.  To have poor judgment for several hours.  To vomit if you eat too soon. Follow these instructions at home: For at least 24 hours after the procedure:    Do not:  Participate in activities where you could fall or become injured.  Drive.  Use heavy machinery.  Drink alcohol.  Take sleeping pills or medicines that cause drowsiness.  Make important decisions or sign legal documents.  Take care of children on your own.  Rest. Eating and drinking   Follow the diet recommended by your health care provider.  If you vomit:  Drink water, juice, or soup when you can drink without vomiting.  Make sure you have little or no nausea before eating solid foods. General instructions   Have a responsible adult stay with you until you are awake and alert.  Take over-the-counter and prescription medicines only as told by your health care provider.  If you smoke, do not smoke without supervision.  Keep all follow-up visits as told by your health care provider. This is important. Contact a health care provider if:  You keep feeling nauseous or you keep vomiting.  You feel light-headed.  You develop a rash.  You have a fever. Get help right away if:  You have trouble breathing. This  information is not intended to replace advice given to you by your health care provider. Make sure you discuss any questions you have with your health care provider. Document Released: 05/28/2013 Document Revised: 01/10/2016 Document Reviewed: 11/27/2015 Elsevier Interactive Patient Education  2017 [employer]  Lithotripsy, Care After This sheet gives you information about how to care for yourself after your procedure. Your health care provider may also give you more specific instructions. If you have problems or questions, contact your health care provider. What can I expect after the procedure? After the procedure, it is common to have:  Some blood in your urine. This should only last for a few days.  Soreness in your back, sides, or upper abdomen for a few days.  Blotches or bruises on your back where the pressure wave entered the skin.  Pain, discomfort, or nausea when pieces (fragments) of the kidney stone move through the tube that carries urine from the kidney to the bladder (ureter). Stone fragments may pass soon after the procedure, but they may continue to pass for up to 4-8 weeks.  If you have severe pain or nausea, contact your health care provider. This may be caused by a large stone that was not broken up, and this may mean that you need more treatment.  Some pain or discomfort during urination.  Some pain or discomfort in the lower abdomen or (in men) at the base of the penis. Follow these instructions at home: Medicines   Take over-the-counter and prescription medicines only as told by your  health care provider.  If you were prescribed an antibiotic medicine, take it as told by your health care provider. Do not stop taking the antibiotic even if you start to feel better.  Do not drive for 24 hours if you were given a medicine to help you relax (sedative).  Do not drive or use heavy machinery while taking prescription pain medicine. Eating and drinking   Drink  enough water and fluids to keep your urine clear or pale yellow. This helps any remaining pieces of the stone to pass. It can also help prevent new stones from forming.  Eat plenty of fresh fruits and vegetables.  Follow instructions from your health care provider about eating and drinking restrictions. You may be instructed:  To reduce how much salt (sodium) you eat or drink. Check ingredients and nutrition facts on packaged foods and beverages.  To reduce how much meat you eat.  Eat the recommended amount of calcium for your age and gender. Ask your health care provider how much calcium you should have. General instructions   Get plenty of rest.  Most people can resume normal activities 1-2 days after the procedure. Ask your health care provider what activities are safe for you.  If directed, strain all urine through the strainer that was provided by your health care provider.  Keep all fragments for your health care provider to see. Any stones that are found may be sent to a medical lab for examination. The stone may be as small as a grain of salt.  Keep all follow-up visits as told by your health care provider. This is important. Contact a health care provider if:  You have pain that is severe or does not get better with medicine.  You have nausea that is severe or does not go away.  You have blood in your urine longer than your health care provider told you to expect.  You have more blood in your urine.  You have pain during urination that does not go away.  You urinate more frequently than usual and this does not go away.  You develop a rash or any other possible signs of an allergic reaction. Get help right away if:  You have severe pain in your back, sides, or upper abdomen.  You have severe pain while urinating.  Your urine is very dark red.  You have blood in your stool (feces).  You cannot pass any urine at all.  You feel a strong urge to urinate after  emptying your bladder.  You have a fever or chills.  You develop shortness of breath, difficulty breathing, or chest pain.  You have severe nausea that leads to persistent vomiting.  You faint. Summary  After this procedure, it is common to have some pain, discomfort, or nausea when pieces (fragments) of the kidney stone move through the tube that carries urine from the kidney to the bladder (ureter). If this pain or nausea is severe, however, you should contact your health care provider.  Most people can resume normal activities 1-2 days after the procedure. Ask your health care provider what activities are safe for you.  Drink enough water and fluids to keep your urine clear or pale yellow. This helps any remaining pieces of the stone to pass, and it can help prevent new stones from forming.  If directed, strain your urine and keep all fragments for your health care provider to see. Fragments or stones may be as small as a grain of salt.  Get help right away if you have severe pain in your back, sides, or upper abdomen or have severe pain while urinating. This information is not intended to replace advice given to you by your health care provider. Make sure you discuss any questions you have with your health care provider. Document Released: 08/27/2007 Document Revised: 06/28/2016 Document Reviewed: 06/28/2016 Elsevier Interactive Patient Education  2017 [employer].

## 2016-11-09 NOTE — Interval H&P Note (Signed)
History and Physical Interval Note:  11/09/2016 1:01 PM  [name] Bass  has presented today for surgery, with the diagnosis of LEFT URETERAL STONE  The various methods of treatment have been discussed with the patient and family. After consideration of risks, benefits and other options for treatment, the patient has consented to  Procedure(s): EXTRACORPOREAL SHOCK WAVE LITHOTRIPSY (ESWL) (Left) as a surgical intervention .  The patient's history has been reviewed, patient examined, no change in status, stable for surgery.  I have reviewed the patient's chart and labs.  Questions were answered to the patient's satisfaction.     Berniece SalinesHERRICK, [name]

## 2016-11-09 NOTE — H&P (Signed)
Acute Kidney Stone  HPI: [name] Bass is a 31 year-old female patient who is here for further eval and management of kidney stones.  She was diagnosed with a kidney stone on 11/05/2016.   Her pain started about 11/05/2016. The pain is on the left side.   The patient relates initially having nausea and flank pain. She is currently having flank pain and nausea. She denies having back pain, groin pain, vomiting, fever, chills, and voiding symptoms. She has not caught a stone in her urine strainer since her symptoms began.   This is not her first kidney stone. She has had 2 stones prior to getting this one.     ALLERGIES: Amoxicillin CAPS    MEDICATIONS: Percocet  Ibuprofen  Multi-Day Vitamins TABS Oral     GU PSH: Ureteroscopic stone removal - 2013      PSH Notes: Cholecystectomy, Tonsillectomy, Cystoscopy With Ureteroscopy With Removal Of Calculus   NON-GU PSH: Bilateral Tubal Ligation, 2013 Cholecystectomy (open) - 2013 Remove Tonsils - 2013    GU PMH: Calculus Ureter, Calculus of ureter - 2014    NON-GU PMH: Personal history of other specified conditions, History of heartburn - 2014 Encounter for general adult medical examination without abnormal findings, Encounter for preventive health examination    FAMILY HISTORY: 2 daughters - Daughter 1 son - Son Death In The Family Mother - Mother Family Health Status Number - Mother   SOCIAL HISTORY: Marital Status: Single Current Smoking Status: Patient smokes.   Tobacco Use Assessment Completed: Used Tobacco in last 30 days? Does drink.  Drinks 1 caffeinated drink per day. Patient's occupation Copywriter, advertising.     Notes: Caffeine Use, Marital History - Currently Married, Occupation:, Alcohol Use, Tobacco Use   REVIEW OF SYSTEMS:    GU Review Female:   Patient denies frequent urination, hard to postpone urination, burning /pain with urination, get up at night to urinate, leakage of urine, stream starts and  stops, trouble starting your stream, have to strain to urinate, and currently pregnant.  Gastrointestinal (Upper):   Patient reports nausea. Patient denies vomiting and indigestion/ heartburn.  Gastrointestinal (Lower):   Patient denies diarrhea and constipation.  Constitutional:   Patient denies fever, night sweats, weight loss, and fatigue.  Skin:   Patient denies itching and skin rash/ lesion.  Eyes:   Patient denies blurred vision and double vision.  Ears/ Nose/ Throat:   Patient denies sore throat and sinus problems.  Hematologic/Lymphatic:   Patient denies swollen glands and easy bruising.  Cardiovascular:   Patient denies leg swelling and chest pains.  Respiratory:   Patient denies cough and shortness of breath.  Endocrine:   Patient denies excessive thirst.  Musculoskeletal:   Patient reports back pain. Patient denies joint pain.  Neurological:   Patient denies headaches and dizziness.  Psychologic:   Patient denies depression and anxiety.   VITAL SIGNS: None   MULTI-SYSTEM PHYSICAL EXAMINATION:    Constitutional: Well-nourished. No physical deformities. Normally developed. Good grooming.  Neck: Neck symmetrical, not swollen. Normal tracheal position.  Respiratory: No labored breathing, no use of accessory muscles. Clear to auscultation bilaterally  Cardiovascular: Normal temperature, normal extremity pulses, no swelling, no varicosities. Regular rate and rhythm  Lymphatic: No enlargement of neck, axillae, groin.  Skin: No paleness, no jaundice, no cyanosis. No lesion, no ulcer, no rash.  Neurologic / Psychiatric: Oriented to time, oriented to place, oriented to person. No depression, no anxiety, no agitation.  Gastrointestinal: No mass, no tenderness, no  rigidity, non obese abdomen.  Eyes: Normal conjunctivae. Normal eyelids.  Ears, Nose, Mouth, and Throat: Left ear no scars, no lesions, no masses. Right ear no scars, no lesions, no masses. Nose no scars, no lesions, no masses.  Normal hearing. Normal lips.  Musculoskeletal: Normal gait and station of head and neck.     PAST DATA REVIEWED:  Source Of History:  Patient   PROCEDURES:         KUB - [id number]  A single view of the abdomen is obtained. Renal shadows are easily visualized bilaterally. There are no stones appreciated within the expected location in either renal pelvis. There are no additional calcifications along the expected location of either ureter bilaterally.  Gas pattern is grossly normal. No significant bony abnormalities.               Urinalysis w/Scope Dipstick Dipstick Cont'd Micro  Color: Yellow Bilirubin: Neg WBC/hpf: 0 - 5/hpf  Appearance: Clear Ketones: Neg RBC/hpf: 0 - 2/hpf  Specific Gravity: 1.020 Blood: 1+ Bacteria: Few (10-25/hpf)  pH: 6.5 Protein: Neg Cystals: NS (Not Seen)  Glucose: Neg Urobilinogen: 0.2 Casts: NS (Not Seen)    Nitrites: Neg Trichomonas: Not Present    Leukocyte Esterase: Neg Mucous: Not Present      Epithelial Cells: 0 - 5/hpf      Yeast: NS (Not Seen)      Sperm: Not Present    ASSESSMENT:      ICD-10 Details  1 GU:   Calculus Ureter - N20.1 Left, The patient has a left proximal ureteral stone with obstruction and associated renal colic symptoms. Currently, her pain is controlled. She would like to get this taken care of ASAP. We discussed treatment options, and the patient opted for shockwave lithotripsy.   PLAN:           Orders Labs Urine Culture  X-Rays: KUB          Schedule Return Visit/Planned Activity: ASAP - Schedule Surgery          Document Letter(s):  Created for Patient: Clinical Summary    We discussed management options including medical expulsion therapy, shockwave lithotripsy, and ureteroscopy. Ultimately, the patient has opted for shock wave lithotripsy. I discussed with the patient the procedure in detail as well as the risk and benefits. The patient is aware that she may need additional procedures. She also is aware of the  risks of hematoma and pain. We will try to get this patient's scheduled as soon as possible.

## 2016-11-09 NOTE — Progress Notes (Signed)
Patient stated first dose of Dilaudid brought pain down to 6/10. Then was assisted to restroom with RN and patient's friend. She was able to void bloody urine. Upon walking back to recliner patient stated pain 9/10. Has been 30 min since first 0.5 Dilaudid dose so second dose of 0.5 mg given (per order). Sats 100% and HR 68. Resting with eyes closed. Continue to closely monitor.

## 2016-11-09 NOTE — Progress Notes (Signed)
Patient sleepy post litho upon arrival back to short stay at 1420. Had a total of 7mg  of Versed and 350mcg of Fentanyl during litho per litho RN report. Able to transfer wheelchair to recliner and fell asleep. VSS. Patient woke up at 1450 and stated pain on left side where litho was done is a 10/10. Crying and moving back and forth in recliner. Call placed to Dr. Marlou PorchHerrick to make him aware of above.

## 2016-11-09 NOTE — Op Note (Signed)
See [facility] OP note scanned into chart. Also because of the size, density, location and other factors that cannot be anticipated I feel this will likely be a staged procedure. This fact supersedes any indication in the scanned [facility] operative note to the contrary.  

## 2016-11-12 ENCOUNTER — Inpatient Hospital Stay (HOSPITAL_COMMUNITY)
Admission: AD | Admit: 2016-11-12 | Discharge: 2016-11-12 | Disposition: A | Discharge status: Home / Self Care | Payer: Medicaid Other | Source: Ambulatory Visit | Attending: Obstetrics and Gynecology | Admitting: Obstetrics and Gynecology

## 2016-11-12 ENCOUNTER — Encounter (HOSPITAL_COMMUNITY): Payer: Self-pay | Admitting: Certified Nurse Midwife

## 2016-11-12 DIAGNOSIS — B3731 Acute candidiasis of vulva and vagina: Secondary | ICD-10-CM

## 2016-11-12 DIAGNOSIS — Z880 Allergy status to penicillin: Secondary | ICD-10-CM | POA: Insufficient documentation

## 2016-11-12 DIAGNOSIS — B373 Candidiasis of vulva and vagina: Secondary | ICD-10-CM

## 2016-11-12 DIAGNOSIS — Z87891 Personal history of nicotine dependence: Secondary | ICD-10-CM | POA: Diagnosis not present

## 2016-11-12 DIAGNOSIS — Z3202 Encounter for pregnancy test, result negative: Secondary | ICD-10-CM | POA: Insufficient documentation

## 2016-11-12 DIAGNOSIS — L293 Anogenital pruritus, unspecified: Secondary | ICD-10-CM | POA: Diagnosis present

## 2016-11-12 LAB — POCT PREGNANCY, URINE: Preg Test, Ur: NEGATIVE

## 2016-11-12 LAB — URINALYSIS, ROUTINE W REFLEX MICROSCOPIC
Bilirubin Urine: NEGATIVE
GLUCOSE, UA: NEGATIVE mg/dL
KETONES UR: NEGATIVE mg/dL
NITRITE: NEGATIVE
PH: 6 (ref 5.0–8.0)
Protein, ur: NEGATIVE mg/dL
Specific Gravity, Urine: 1.013 (ref 1.005–1.030)

## 2016-11-12 LAB — WET PREP, GENITAL
SPERM: NONE SEEN
TRICH WET PREP: NONE SEEN

## 2016-11-12 MED ORDER — FLUCONAZOLE 150 MG PO TABS: 150.0000 mg | ORAL_TABLET | Freq: Once | ORAL | 0 refills | Status: AC

## 2016-11-12 NOTE — Discharge Instructions (Signed)

## 2016-11-12 NOTE — MAU Note (Signed)
Pt c/o vaginal itching and irritation that started Friday. States she had lithotripsy done on Thursday and was given a one time dose of antibiotics that day. Pt denies vag discharge or vag bleeding. Pt denies urinary s/s. LMP 10/10/2016.

## 2016-11-12 NOTE — MAU Provider Note (Signed)
History     CSN: [id number]  Arrival date and time: 11/12/16 2132   First Provider Initiated Contact with Patient 11/12/16 2216      Chief Complaint  Patient presents with  . Vaginal Itching  . Vaginal Discharge   Non-pregnant femal here with vaginal itching and irritation x3 days. Denies vaginal discharge. No new skin products or detergent. Has not tried anything for the sx. No new partner. Hx of HSV and HPV. No recent outbreaks.     Past Medical History:  Diagnosis Date  . Abnormal Pap smear   . Bacterial vaginosis 04/29/04  . Chronic kidney disease    kidney stones  . CIN I (cervical intraepithelial neoplasia I)   . Dyspareunia 05/02/10  . Dysuria 04/04/05  . FHx: suicide   . H/O amenorrhea 02/19/04  . Herpes simplex type 2 infection    "last outbreak 3 weeks ago"  . Herpes simplex without mention of complication   . 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 Hx: UTI (urinary tract infection)   . Infection    UTI  . Irregular periods/menstrual cycles   . Polycystic ovarian syndrome 05/08/05  . Vaginal Pap smear, abnormal   . Vaginitis and vulvovaginitis 04/29/04    Past Surgical History:  Procedure Laterality Date  . CHOLECYSTECTOMY, LAPAROSCOPIC  2010  . CYSTOSCOPY WITH URETEROSCOPY  03/28/2012   Procedure: CYSTOSCOPY WITH URETEROSCOPY;  Surgeon: Marcine Matar, MD;  Location: [facility];  Service: Urology;  Laterality: Right;  RIGHT URETEROSCOPIC STONE EXTRACTION W/ LASER (Pt is [redacted] wk pregnant)    . EXTRACORPOREAL SHOCK WAVE LITHOTRIPSY Left 11/09/2016   Procedure: EXTRACORPOREAL SHOCK WAVE LITHOTRIPSY (ESWL);  Surgeon: Crist Fat, MD;  Location: [facility];  Service: Urology;  Laterality: Left;  . STONE EXTRACTION WITH BASKET  03/28/2012   Procedure: STONE EXTRACTION WITH BASKET;  Surgeon: Marcine Matar, MD;  Location: [facility];  Service: Urology;  Laterality: Right;  . TONSILLECTOMY  2006  . TUBAL LIGATION  08/04/2012   Procedure: POST PARTUM TUBAL LIGATION;  Surgeon:  Hal Morales, MD;  Location: [facility];  Service: Gynecology;  Laterality: Bilateral;  Bilateral post partum tubal ligation  . WISDOM TOOTH EXTRACTION  2001    Family History  Problem Relation Age of Onset  . Heart disease Father     heart attack in oct.  lm  . Cancer Maternal Uncle     bone cancer  . Diabetes Maternal Uncle   . Cancer Maternal Grandmother     ovarian cancer  . Suicidality Mother   . Diabetes Maternal Aunt   . Cancer Maternal Aunt     lung  . Hypertension Paternal Uncle     Social History  Substance Use Topics  . Smoking status: Former Games developer  . Smokeless tobacco: Never Used     Comment: Jan 2015  . Alcohol use 0.0 oz/week     Comment: occasional    Allergies:  Allergies  Allergen Reactions  . Amoxicillin Rash and Other (See Comments)    Has patient had a PCN reaction causing immediate rash, facial/tongue/throat swelling, SOB or lightheadedness with hypotension: No Has patient had a PCN reaction causing severe rash involving mucus membranes or skin necrosis: No Has patient had a PCN reaction that required hospitalization No Has patient had a PCN reaction occurring within the last 10 years: No If all of the above answers are "NO", then may proceed with Cephalosporin use.    Prescriptions Prior to Admission  Medication Sig  Dispense Refill Last Dose  . ondansetron (ZOFRAN ODT) 4 MG disintegrating tablet Take 1 tablet (4 mg total) by mouth every 8 (eight) hours as needed for nausea or vomiting. 20 tablet 0 11/11/2016 at Unknown time  . acetaminophen (TYLENOL) 500 MG tablet Take 1,500 mg by mouth every 6 (six) hours as needed for mild pain, moderate pain or headache.    Past Week at Unknown time  . ibuprofen (ADVIL,MOTRIN) 800 MG tablet Take 800 mg by mouth every 8 (eight) hours as needed (pain).   11/06/2016 at 0800    Review of Systems  Constitutional: Negative.   Genitourinary: Positive for vaginal pain. Negative for vaginal discharge.   Physical Exam    Blood pressure 139/85, pulse 89, temperature 98.4 F (36.9 C), temperature source Oral, resp. rate 16, last menstrual period 10/10/2016, currently breastfeeding.  Physical Exam  Nursing note and vitals reviewed. Constitutional: She is oriented to person, place, and time. She appears well-developed and well-nourished. No distress.  HENT:  Head: Normocephalic and atraumatic.  Neck: Normal range of motion.  Cardiovascular: Normal rate.   Respiratory: Effort normal.  Genitourinary:  Genitourinary Comments: External: no lesions, mild vulvar erythema Vagina: rugated, parous, scant thin brown discharge   Musculoskeletal: Normal range of motion.  Neurological: She is alert and oriented to person, place, and time.  Skin: Skin is warm and dry.  Psychiatric: She has a normal mood and affect.   Results for orders placed or performed during the hospital encounter of 11/12/16 (from the past 24 hour(s))  Urinalysis, Routine w reflex microscopic     Status: Abnormal   Collection Time: 11/12/16  9:45 PM  Result Value Ref Range   Color, Urine YELLOW YELLOW   APPearance CLEAR CLEAR   Specific Gravity, Urine 1.013 1.005 - 1.030   pH 6.0 5.0 - 8.0   Glucose, UA NEGATIVE NEGATIVE mg/dL   Hgb urine dipstick MODERATE (A) NEGATIVE   Bilirubin Urine NEGATIVE NEGATIVE   Ketones, ur NEGATIVE NEGATIVE mg/dL   Protein, ur NEGATIVE NEGATIVE mg/dL   Nitrite NEGATIVE NEGATIVE   Leukocytes, UA TRACE (A) NEGATIVE   RBC / HPF 0-5 0 - 5 RBC/hpf   WBC, UA 0-5 0 - 5 WBC/hpf   Bacteria, UA RARE (A) NONE SEEN   Squamous Epithelial / LPF 0-5 (A) NONE SEEN   Mucous PRESENT   Pregnancy, urine POC     Status: None   Collection Time: 11/12/16  9:55 PM  Result Value Ref Range   Preg Test, Ur NEGATIVE NEGATIVE  Wet prep, genital     Status: Abnormal   Collection Time: 11/12/16 10:07 PM  Result Value Ref Range   Yeast Wet Prep HPF POC PRESENT (A) NONE SEEN   Trich, Wet Prep NONE SEEN NONE SEEN   Clue Cells Wet  Prep HPF POC PRESENT (A) NONE SEEN   WBC, Wet Prep HPF POC MODERATE (A) NONE SEEN   Sperm NONE SEEN    MAU Course  Procedures  MDM Labs ordered and reviewed. No evidence of HSV outbreak or other STI. Will treat yeast. Tub soaks with baking soda. No IC until sx resolve. Stable for discharge home.   Assessment and Plan   1. Yeast vaginitis    Discharge home Follow up I with PCP [facility] Return to MAU for OBGYN emergencies  Allergies as of 11/12/2016      Reactions   Amoxicillin Rash, Other (See Comments)   Has  patient had a PCN reaction causing immediate rash, facial/tongue/throat swelling, SOB or lightheadedness with hypotension: No Has patient had a PCN reaction causing severe rash involving mucus membranes or skin necrosis: No Has patient had a PCN reaction that required hospitalization No Has patient had a PCN reaction occurring within the last 10 years: No If all of the above answers are "NO", then may proceed with Cephalosporin use.      Medication List    TAKE these medications   acetaminophen 500 MG tablet Commonly known as:  TYLENOL Take 1,500 mg by mouth every 6 (six) hours as needed for mild pain, moderate pain or headache.   fluconazole 150 MG tablet Commonly known as:  DIFLUCAN Take 1 tablet (150 mg total) by mouth once.   ibuprofen 800 MG tablet Commonly known as:  ADVIL,MOTRIN Take 800 mg by mouth every 8 (eight) hours as needed (pain).   ondansetron 4 MG disintegrating tablet Commonly known as:  ZOFRAN ODT Take 1 tablet (4 mg total) by mouth every 8 (eight) hours as needed for nausea or vomiting.      Donette Larry, CNM 11/12/2016, 10:23 PM

## 2016-11-13 LAB — GC/CHLAMYDIA PROBE AMP (~~LOC~~) NOT AT ARMC
Chlamydia: NEGATIVE
Neisseria Gonorrhea: NEGATIVE

## 2017-01-18 IMAGING — DX DG ABDOMEN 2V
2 series · 2 of 2 positions shown · non-contrast
Comparison: CT abdomen pelvis - 03/03/2015

CLINICAL DATA: Lower abdominal pain and nausea for 24 hours.
Evaluate for ileus.

EXAM:
ABDOMEN - 2 VIEW

[abdomen erect]
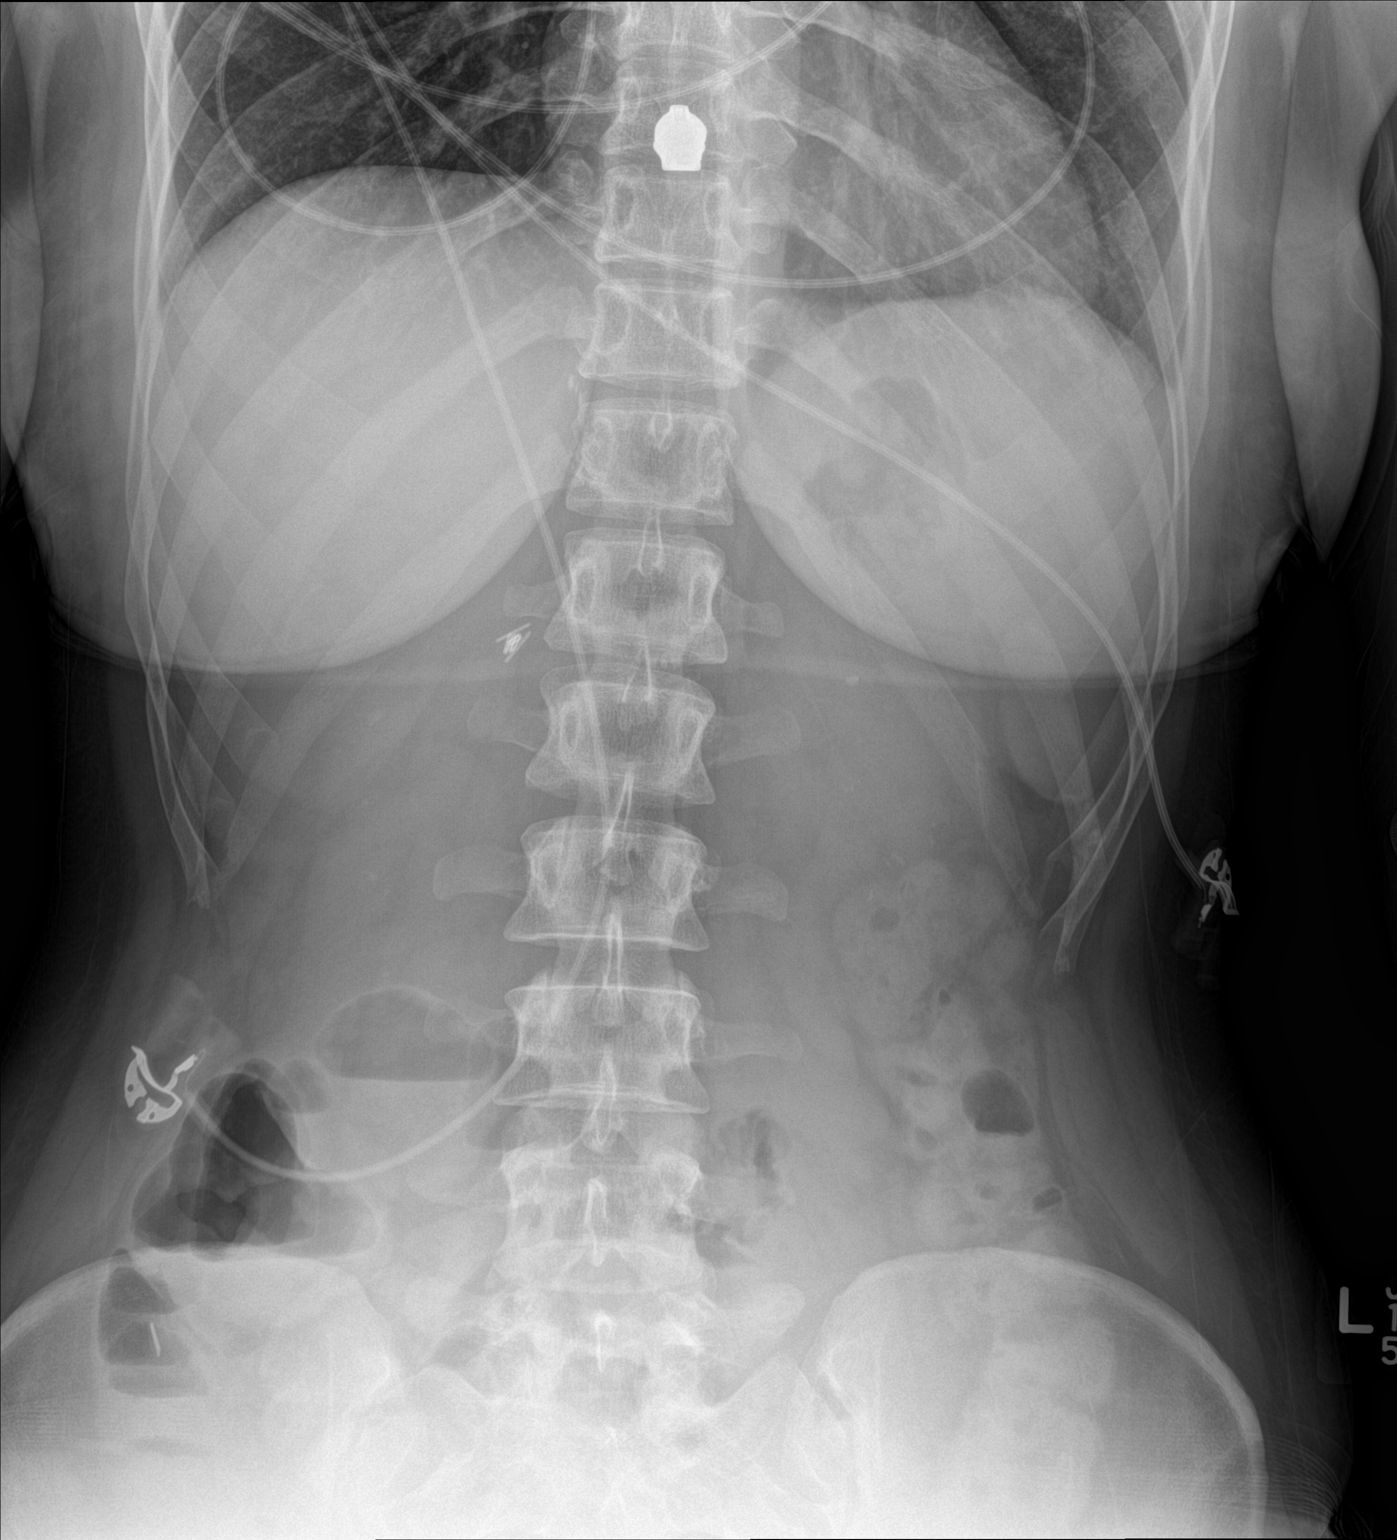

[abdomen supine]
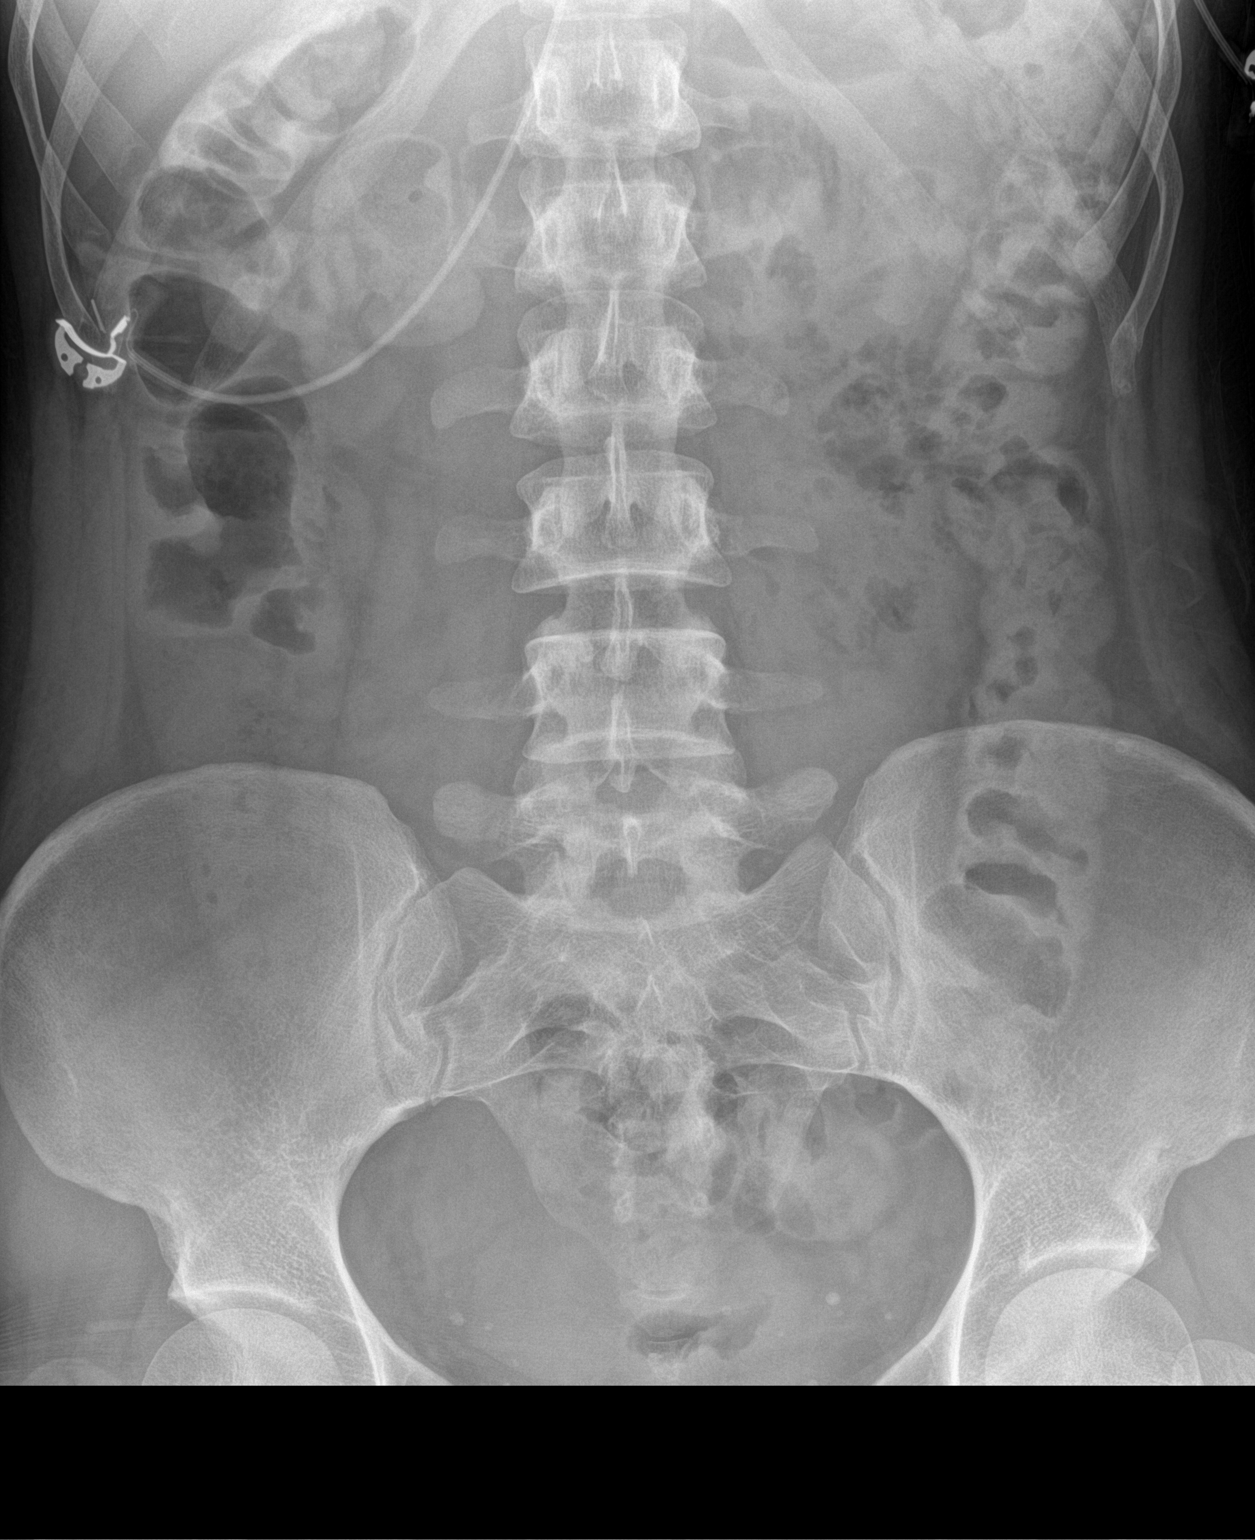

[2 of 2 positions shown; findings below may reference images not displayed]

FINDINGS: Moderate colonic stool burden without evidence of obstruction. No
pneumoperitoneum, pneumatosis or portal venous gas.

There is a punctate (approximately 3 mm) opacity overlying expected
location of the expected location of the left renal fossa which
likely correlates with the nonobstructing stones seen on preceding
abdominal CT. Known punctate (approximately 2 mm) nonobstructing
right-sided renal stone is not well demonstrated.

Multiple phleboliths overlie the lower pelvis bilaterally, left
greater than right.

Limited visualization of lower thorax is normal.

No acute osseus abnormalities.
IMPRESSION: 1. Moderate colonic stool burden without evidence of enteric
obstruction or ileus.
2. Suspected left-sided nephrolithiasis as demonstrated on preceding
abdominal CT.

## 2020-01-15 ENCOUNTER — Other Ambulatory Visit: Payer: Self-pay | Admitting: Urology

## 2020-01-15 ENCOUNTER — Other Ambulatory Visit (HOSPITAL_COMMUNITY): Payer: Self-pay | Admitting: Urology

## 2020-01-15 DIAGNOSIS — N201 Calculus of ureter: Secondary | ICD-10-CM

## 2020-01-16 ENCOUNTER — Ambulatory Visit (HOSPITAL_COMMUNITY)
Admission: RE | Admit: 2020-01-16 | Discharge: 2020-01-16 | Disposition: A | Discharge status: Home / Self Care | Payer: Self-pay | Source: Ambulatory Visit | Attending: Urology | Admitting: Urology

## 2020-01-16 ENCOUNTER — Other Ambulatory Visit (HOSPITAL_COMMUNITY)
Admission: RE | Admit: 2020-01-16 | Discharge: 2020-01-16 | Disposition: A | Discharge status: Home / Self Care | Payer: HRSA Program | Source: Ambulatory Visit | Attending: Urology | Admitting: Urology

## 2020-01-16 ENCOUNTER — Other Ambulatory Visit: Payer: Self-pay | Admitting: Urology

## 2020-01-16 ENCOUNTER — Other Ambulatory Visit: Payer: Self-pay

## 2020-01-16 ENCOUNTER — Encounter (HOSPITAL_COMMUNITY): Payer: Self-pay | Admitting: Urology

## 2020-01-16 DIAGNOSIS — Z01812 Encounter for preprocedural laboratory examination: Secondary | ICD-10-CM | POA: Diagnosis present

## 2020-01-16 DIAGNOSIS — Z20822 Contact with and (suspected) exposure to covid-19: Secondary | ICD-10-CM | POA: Diagnosis not present

## 2020-01-16 DIAGNOSIS — N201 Calculus of ureter: Secondary | ICD-10-CM | POA: Insufficient documentation

## 2020-01-16 LAB — SARS CORONAVIRUS 2 (TAT 6-24 HRS): SARS Coronavirus 2: NEGATIVE

## 2020-01-16 NOTE — Progress Notes (Signed)
Need orderrs in epic.  Surgery on 01/17/20.

## 2020-01-17 ENCOUNTER — Ambulatory Visit (HOSPITAL_COMMUNITY): Payer: Self-pay | Admitting: Certified Registered"

## 2020-01-17 ENCOUNTER — Observation Stay (HOSPITAL_COMMUNITY)
Admission: RE | Admit: 2020-01-17 | Discharge: 2020-01-18 | Disposition: A | Discharge status: Home / Self Care | Payer: Self-pay | Attending: Urology | Admitting: Urology

## 2020-01-17 ENCOUNTER — Encounter (HOSPITAL_COMMUNITY): Payer: Self-pay | Admitting: Urology

## 2020-01-17 ENCOUNTER — Ambulatory Visit (HOSPITAL_COMMUNITY): Payer: Self-pay

## 2020-01-17 ENCOUNTER — Encounter (HOSPITAL_COMMUNITY)
Admission: RE | Disposition: A | Discharge status: Home / Self Care | Payer: Self-pay | Source: Home / Self Care | Attending: Urology

## 2020-01-17 ENCOUNTER — Other Ambulatory Visit: Payer: Self-pay

## 2020-01-17 DIAGNOSIS — Z20822 Contact with and (suspected) exposure to covid-19: Secondary | ICD-10-CM | POA: Insufficient documentation

## 2020-01-17 DIAGNOSIS — N200 Calculus of kidney: Secondary | ICD-10-CM

## 2020-01-17 DIAGNOSIS — Z87442 Personal history of urinary calculi: Secondary | ICD-10-CM | POA: Insufficient documentation

## 2020-01-17 DIAGNOSIS — N189 Chronic kidney disease, unspecified: Secondary | ICD-10-CM | POA: Insufficient documentation

## 2020-01-17 DIAGNOSIS — Z91012 Allergy to eggs: Secondary | ICD-10-CM | POA: Insufficient documentation

## 2020-01-17 DIAGNOSIS — Z87891 Personal history of nicotine dependence: Secondary | ICD-10-CM | POA: Insufficient documentation

## 2020-01-17 DIAGNOSIS — N132 Hydronephrosis with renal and ureteral calculous obstruction: Principal | ICD-10-CM | POA: Insufficient documentation

## 2020-01-17 DIAGNOSIS — E282 Polycystic ovarian syndrome: Secondary | ICD-10-CM | POA: Insufficient documentation

## 2020-01-17 DIAGNOSIS — Z880 Allergy status to penicillin: Secondary | ICD-10-CM | POA: Insufficient documentation

## 2020-01-17 HISTORY — PX: CYSTOSCOPY WITH RETROGRADE PYELOGRAM, URETEROSCOPY AND STENT PLACEMENT: SHX5789

## 2020-01-17 HISTORY — DX: Essential (primary) hypertension: I10

## 2020-01-17 HISTORY — DX: Personal history of urinary calculi: Z87.442

## 2020-01-17 HISTORY — PX: HOLMIUM LASER APPLICATION: SHX5852

## 2020-01-17 LAB — CBC
HCT: 44.3 % (ref 36.0–46.0)
Hemoglobin: 15.1 g/dL — ABNORMAL HIGH (ref 12.0–15.0)
MCH: 31.9 pg (ref 26.0–34.0)
MCHC: 34.1 g/dL (ref 30.0–36.0)
MCV: 93.7 fL (ref 80.0–100.0)
Platelets: 302 10*3/uL (ref 150–400)
RBC: 4.73 MIL/uL (ref 3.87–5.11)
RDW: 12.2 % (ref 11.5–15.5)
WBC: 11 10*3/uL — ABNORMAL HIGH (ref 4.0–10.5)
nRBC: 0 % (ref 0.0–0.2)

## 2020-01-17 LAB — BASIC METABOLIC PANEL
Anion gap: 6 (ref 5–15)
BUN: 14 mg/dL (ref 6–20)
CO2: 27 mmol/L (ref 22–32)
Calcium: 9.1 mg/dL (ref 8.9–10.3)
Chloride: 103 mmol/L (ref 98–111)
Creatinine, Ser: 0.77 mg/dL (ref 0.44–1.00)
GFR calc Af Amer: >60 mL/min (ref >60)
GFR calc non Af Amer: >60 mL/min (ref >60)
Glucose, Bld: 101 mg/dL — ABNORMAL HIGH (ref 70–99)
Potassium: 3.5 mmol/L (ref 3.5–5.1)
Sodium: 136 mmol/L (ref 135–145)

## 2020-01-17 LAB — HCG, SERUM, QUALITATIVE: Preg, Serum: NEGATIVE

## 2020-01-17 SURGERY — CYSTOURETEROSCOPY, WITH RETROGRADE PYELOGRAM AND STENT INSERTION
Anesthesia: General | Site: Ureter | Laterality: Bilateral

## 2020-01-17 MED ORDER — ACETAMINOPHEN 160 MG/5ML PO SOLN: 325.0000 mg | Freq: Once | ORAL | Status: DC | PRN

## 2020-01-17 MED ORDER — HYDROMORPHONE HCL 1 MG/ML IJ SOLN
1.0000 mg | INTRAMUSCULAR | Status: DC | PRN
  Administered 2020-01-17: 1 mg via INTRAVENOUS
  Filled 2020-01-17: qty 1

## 2020-01-17 MED ORDER — MIDAZOLAM HCL 2 MG/2ML IJ SOLN
INTRAMUSCULAR | Status: DC | PRN
  Administered 2020-01-17: 2 mg via INTRAVENOUS

## 2020-01-17 MED ORDER — FENTANYL CITRATE (PF) 100 MCG/2ML IJ SOLN
INTRAMUSCULAR | Status: AC
  Filled 2020-01-17: qty 2

## 2020-01-17 MED ORDER — OXYCODONE HCL 5 MG PO TABS
ORAL_TABLET | ORAL | Status: AC
  Filled 2020-01-17: qty 1

## 2020-01-17 MED ORDER — HYDROMORPHONE HCL 1 MG/ML IJ SOLN
INTRAMUSCULAR | Status: AC
  Filled 2020-01-17: qty 1

## 2020-01-17 MED ORDER — OXYCODONE-ACETAMINOPHEN 5-325 MG PO TABS
1.0000 | ORAL_TABLET | ORAL | Status: DC | PRN
  Administered 2020-01-18: 1 via ORAL
  Filled 2020-01-17: qty 1

## 2020-01-17 MED ORDER — ORAL CARE MOUTH RINSE: 15.0000 mL | Freq: Once | OROMUCOSAL | Status: AC

## 2020-01-17 MED ORDER — OXYCODONE-ACETAMINOPHEN 5-325 MG PO TABS: 1.0000 | ORAL_TABLET | ORAL | 0 refills | Status: DC | PRN

## 2020-01-17 MED ORDER — BELLADONNA ALKALOIDS-OPIUM 16.2-60 MG RE SUPP
1.0000 | Freq: Once | RECTAL | Status: AC
  Administered 2020-01-17: 1 via RECTAL
  Filled 2020-01-17: qty 1

## 2020-01-17 MED ORDER — SCOPOLAMINE 1 MG/3DAYS TD PT72
MEDICATED_PATCH | TRANSDERMAL | Status: AC
  Filled 2020-01-17: qty 1

## 2020-01-17 MED ORDER — ONDANSETRON HCL 4 MG PO TABS: 4.0000 mg | ORAL_TABLET | Freq: Three times a day (TID) | ORAL | 1 refills | Status: AC | PRN

## 2020-01-17 MED ORDER — PROPOFOL 10 MG/ML IV BOLUS
INTRAVENOUS | Status: DC | PRN
  Administered 2020-01-17: 150 mg via INTRAVENOUS

## 2020-01-17 MED ORDER — CEFAZOLIN SODIUM-DEXTROSE 2-4 GM/100ML-% IV SOLN
2.0000 g | INTRAVENOUS | Status: AC
  Administered 2020-01-17: 2 g via INTRAVENOUS

## 2020-01-17 MED ORDER — OXYCODONE HCL 5 MG PO TABS
5.0000 mg | ORAL_TABLET | Freq: Once | ORAL | Status: AC | PRN
  Administered 2020-01-17: 5 mg via ORAL

## 2020-01-17 MED ORDER — ACETAMINOPHEN 10 MG/ML IV SOLN
INTRAVENOUS | Status: AC
  Filled 2020-01-17: qty 100

## 2020-01-17 MED ORDER — DEXAMETHASONE SODIUM PHOSPHATE 10 MG/ML IJ SOLN
INTRAMUSCULAR | Status: DC | PRN
  Administered 2020-01-17: 10 mg via INTRAVENOUS

## 2020-01-17 MED ORDER — LACTATED RINGERS IV SOLN: INTRAVENOUS | Status: DC

## 2020-01-17 MED ORDER — PROMETHAZINE HCL 25 MG/ML IJ SOLN: 6.2500 mg | INTRAMUSCULAR | Status: DC | PRN

## 2020-01-17 MED ORDER — FENTANYL CITRATE (PF) 100 MCG/2ML IJ SOLN
25.0000 ug | INTRAMUSCULAR | Status: DC | PRN
  Administered 2020-01-17 (×3): 50 ug via INTRAVENOUS

## 2020-01-17 MED ORDER — PROPOFOL 10 MG/ML IV BOLUS
INTRAVENOUS | Status: AC
  Filled 2020-01-17: qty 20

## 2020-01-17 MED ORDER — SODIUM CHLORIDE 0.9 % IR SOLN
Status: DC | PRN
  Administered 2020-01-17: 3000 mL

## 2020-01-17 MED ORDER — NIFEDIPINE ER OSMOTIC RELEASE 30 MG PO TB24
30.0000 mg | ORAL_TABLET | Freq: Every day | ORAL | Status: DC
  Administered 2020-01-17: 30 mg via ORAL
  Filled 2020-01-17: qty 1

## 2020-01-17 MED ORDER — FENTANYL CITRATE (PF) 100 MCG/2ML IJ SOLN
INTRAMUSCULAR | Status: DC | PRN
  Administered 2020-01-17 (×4): 50 ug via INTRAVENOUS

## 2020-01-17 MED ORDER — DIPHENHYDRAMINE HCL 12.5 MG/5ML PO ELIX: 12.5000 mg | ORAL_SOLUTION | Freq: Four times a day (QID) | ORAL | Status: DC | PRN

## 2020-01-17 MED ORDER — KETOROLAC TROMETHAMINE 30 MG/ML IJ SOLN
30.0000 mg | Freq: Three times a day (TID) | INTRAMUSCULAR | Status: DC
  Administered 2020-01-17 – 2020-01-18 (×3): 30 mg via INTRAVENOUS
  Filled 2020-01-17 (×3): qty 1

## 2020-01-17 MED ORDER — IOHEXOL 300 MG/ML  SOLN
INTRAMUSCULAR | Status: DC | PRN
  Administered 2020-01-17: 50 mL

## 2020-01-17 MED ORDER — ONDANSETRON HCL 4 MG/2ML IJ SOLN: 4.0000 mg | INTRAMUSCULAR | Status: DC | PRN

## 2020-01-17 MED ORDER — MIDAZOLAM HCL 2 MG/2ML IJ SOLN
INTRAMUSCULAR | Status: AC
  Filled 2020-01-17: qty 2

## 2020-01-17 MED ORDER — OXYCODONE HCL 5 MG/5ML PO SOLN: 5.0000 mg | Freq: Once | ORAL | Status: AC | PRN

## 2020-01-17 MED ORDER — CHLORHEXIDINE GLUCONATE 0.12 % MT SOLN
15.0000 mL | Freq: Once | OROMUCOSAL | Status: AC
  Administered 2020-01-17: 15 mL via OROMUCOSAL

## 2020-01-17 MED ORDER — DIPHENHYDRAMINE HCL 50 MG/ML IJ SOLN: 12.5000 mg | Freq: Four times a day (QID) | INTRAMUSCULAR | Status: DC | PRN

## 2020-01-17 MED ORDER — ONDANSETRON HCL 4 MG/2ML IJ SOLN
INTRAMUSCULAR | Status: DC | PRN
  Administered 2020-01-17: 4 mg via INTRAVENOUS

## 2020-01-17 MED ORDER — CEFAZOLIN SODIUM-DEXTROSE 2-4 GM/100ML-% IV SOLN
INTRAVENOUS | Status: AC
  Filled 2020-01-17: qty 100

## 2020-01-17 MED ORDER — MEPERIDINE HCL 50 MG/ML IJ SOLN: 6.2500 mg | INTRAMUSCULAR | Status: DC | PRN

## 2020-01-17 MED ORDER — HYDROMORPHONE HCL 1 MG/ML IJ SOLN
0.5000 mg | INTRAMUSCULAR | Status: DC | PRN
  Administered 2020-01-17 (×3): 1 mg via INTRAVENOUS
  Filled 2020-01-17 (×2): qty 1

## 2020-01-17 MED ORDER — SODIUM CHLORIDE 0.9 % IV SOLN: INTRAVENOUS | Status: DC

## 2020-01-17 MED ORDER — LIDOCAINE 2% (20 MG/ML) 5 ML SYRINGE
INTRAMUSCULAR | Status: DC | PRN
  Administered 2020-01-17: 40 mg via INTRAVENOUS

## 2020-01-17 MED ORDER — ACETAMINOPHEN 10 MG/ML IV SOLN
1000.0000 mg | Freq: Once | INTRAVENOUS | Status: DC | PRN
  Administered 2020-01-17: 1000 mg via INTRAVENOUS

## 2020-01-17 MED ORDER — ZOLPIDEM TARTRATE 5 MG PO TABS: 5.0000 mg | ORAL_TABLET | Freq: Every evening | ORAL | Status: DC | PRN

## 2020-01-17 MED ORDER — ACETAMINOPHEN 325 MG PO TABS: 325.0000 mg | ORAL_TABLET | Freq: Once | ORAL | Status: DC | PRN

## 2020-01-17 MED ORDER — PHENAZOPYRIDINE HCL 100 MG PO TABS: 100.0000 mg | ORAL_TABLET | Freq: Three times a day (TID) | ORAL | 0 refills | Status: DC | PRN

## 2020-01-17 MED ORDER — ACETAMINOPHEN 325 MG PO TABS: 650.0000 mg | ORAL_TABLET | ORAL | Status: DC | PRN

## 2020-01-17 MED ORDER — SCOPOLAMINE 1 MG/3DAYS TD PT72
MEDICATED_PATCH | TRANSDERMAL | Status: DC | PRN
  Administered 2020-01-17: 1 via TRANSDERMAL

## 2020-01-17 SURGICAL SUPPLY — 27 items
BAG URO CATCHER STRL LF (MISCELLANEOUS) ×4 IMPLANT
CATH INTERMIT  6FR 70CM (CATHETERS) ×4 IMPLANT
CLOTH BEACON ORANGE TIMEOUT ST (SAFETY) ×4 IMPLANT
COVER SURGICAL LIGHT HANDLE (MISCELLANEOUS) ×4 IMPLANT
COVER WAND RF STERILE (DRAPES) IMPLANT
EXTRACTOR STONE NITINOL NGAGE (UROLOGICAL SUPPLIES) ×2 IMPLANT
FIBER LASER FLEXIVA 1000 (UROLOGICAL SUPPLIES) IMPLANT
FIBER LASER FLEXIVA 365 (UROLOGICAL SUPPLIES) IMPLANT
FIBER LASER FLEXIVA 550 (UROLOGICAL SUPPLIES) IMPLANT
FIBER LASER TRAC TIP (UROLOGICAL SUPPLIES) ×2 IMPLANT
GLOVE BIO SURGEON STRL SZ8 (GLOVE) ×4 IMPLANT
GOWN STRL REUS W/TWL XL LVL3 (GOWN DISPOSABLE) ×4 IMPLANT
GUIDEWIRE ANG ZIPWIRE 038X150 (WIRE) IMPLANT
GUIDEWIRE STR DUAL SENSOR (WIRE) ×4 IMPLANT
IV NS 1000ML (IV SOLUTION) ×4
IV NS 1000ML BAXH (IV SOLUTION) ×2 IMPLANT
KIT TURNOVER KIT A (KITS) IMPLANT
MANIFOLD NEPTUNE II (INSTRUMENTS) ×4 IMPLANT
PENCIL SMOKE EVACUATOR (MISCELLANEOUS) IMPLANT
SHEATH URETERAL 12FRX35CM (MISCELLANEOUS) IMPLANT
STENT URET 6FRX24 CONTOUR (STENTS) ×2 IMPLANT
STENT URET 6FRX26 CONTOUR (STENTS) IMPLANT
TRAY CYSTO PACK (CUSTOM PROCEDURE TRAY) ×4 IMPLANT
TUBE FEEDING 8FR 16IN STR KANG (MISCELLANEOUS) IMPLANT
TUBING CONNECTING 10 (TUBING) ×3 IMPLANT
TUBING CONNECTING 10' (TUBING) ×1
TUBING UROLOGY SET (TUBING) ×2 IMPLANT

## 2020-01-17 NOTE — Anesthesia Procedure Notes (Signed)
Date/Time: 01/17/2020 8:33 AM Performed by: Minerva Ends, CRNA Oxygen Delivery Method: Simple face mask Placement Confirmation: positive ETCO2 and breath sounds checked- equal and bilateral Dental Injury: Teeth and Oropharynx as per pre-operative assessment

## 2020-01-17 NOTE — Anesthesia Procedure Notes (Signed)
Procedure Name: LMA Insertion Date/Time: 01/17/2020 7:43 AM Performed by: Minerva Ends, CRNA Pre-anesthesia Checklist: Patient identified, Emergency Drugs available, Suction available and Patient being monitored Patient Re-evaluated:Patient Re-evaluated prior to induction Oxygen Delivery Method: Circle System Utilized Preoxygenation: Pre-oxygenation with 100% oxygen Induction Type: IV induction Ventilation: Mask ventilation without difficulty LMA: LMA inserted LMA Size: 4.0 Tube type: Oral Number of attempts: 1 Placement Confirmation: positive ETCO2 Tube secured with: Tape Dental Injury: Teeth and Oropharynx as per pre-operative assessment  Comments: Smooth IV induction Hollis-- LMA insertion AM CRNA atraumatic-- teeth and mouth as preop bilat BS

## 2020-01-17 NOTE — Progress Notes (Addendum)
Boyfriend called for transport home, d/c instructions

## 2020-01-17 NOTE — Transfer of Care (Signed)
Immediate Anesthesia Transfer of Care Note  Patient: [name] Bass  Procedure(s) Performed: CYSTOSCOPY WITH RETROGRADE PYELOGRAM, URETEROSCOPY bilateral right double j stent placement (Bilateral Ureter) HOLMIUM LASER APPLICATION (Bilateral )  Patient Location: PACU  Anesthesia Type:General  Level of Consciousness: awake and alert   Airway & Oxygen Therapy: Patient Spontanous Breathing and Patient connected to face mask oxygen  Post-op Assessment: Report given to RN and Post -op Vital signs reviewed and stable  Post vital signs: Reviewed and stable  Last Vitals:  Vitals Value Taken Time  BP 115/61 01/17/20 0837  Temp 36.6 C 01/17/20 0837  Pulse 65 01/17/20 0843  Resp 8 01/17/20 0843  SpO2 100 % 01/17/20 0843  Vitals shown include unvalidated device data.  Last Pain:  Vitals:   01/17/20 0634  TempSrc: Oral  PainSc:       Patients Stated Pain Goal: 4 (01/17/20 9787)  Complications: No apparent anesthesia complications

## 2020-01-17 NOTE — Addendum Note (Signed)
Addendum  created 01/17/20 1114 by Shelton Silvas, MD   Clinical Note Signed

## 2020-01-17 NOTE — Discharge Instructions (Signed)

## 2020-01-17 NOTE — Progress Notes (Signed)
   01/17/20 1254  Vitals  Temp 98.1 F (36.7 C)  Temp Source Oral  BP (!) 141/94  MAP (mmHg) 110  BP Location Right Arm  BP Method Automatic  Patient Position (if appropriate) Lying  Pulse Rate (!) 101  Resp 18  Oxygen Therapy  SpO2 100 %  O2 Device Room Air  MEWS Score  MEWS Temp 0  MEWS Systolic 0  MEWS Pulse 1  MEWS RR 0  MEWS LOC 1  MEWS Score 2  MEWS Score Color Yellow  Patient is post-op in severe pain. Crying out. PRN pain med given. Md notified. Pt fell asleep after prn. Monitoring every 4 hours per protocol.Melton Alar, RN

## 2020-01-17 NOTE — Progress Notes (Signed)
Pt sitting in [facility] waiting on transport home. Begins complaining of dizziness, says she feels she is going to pass out. Placed back in stretcher, on monitors. Dr Ronne Binning notified, will admit overnight. Boyfriend updated.

## 2020-01-17 NOTE — Op Note (Addendum)
.  Preoperative diagnosis: bilateral ureteral calculi  Postoperative diagnosis: Same  Procedure: 1 cystoscopy 2. bilateralretrograde pyelography 3.  Intraoperative fluoroscopy, under one hour, with interpretation 4.  Bilateral ureteroscopic stone manipulation with laser lithotripsy 5.  Right 6x24 JJ ureteral stent placement  Attending: Cleda Mccreedy  Anesthesia: General  Estimated blood loss: None  Drains: right 6 x 24 JJ ureteral stent without tether  Specimens: stone for analysis  Antibiotics: ancef  Findings: right dital ureteral calculus, left proximal ureteral calculus. Mild bilateral hydronephrosis. No masses/lesions in the bladder. Ureteral orifices in normal anatomic location.  Indications: Patient is a 34 year old female with a history of bilateral distal ureteral stone. After discussing treatment options, they decided proceed with bilateral ureteroscopic stone manipulation.  Procedure her in detail: The patient was brought to the operating room and a brief timeout was done to ensure correct patient, correct procedure, correct site.  General anesthesia was administered patient was placed in dorsal lithotomy position.  Her genitalia was then prepped and draped in usual sterile fashion.  A rigid 22 French cystoscope was passed in the urethra and the bladder.  Bladder was inspected free masses or lesions.  the ureteral orifices were in the normal orthotopic locations. a 6 french ureteral catheter was then instilled into the left ureteral orifice.  a gentle retrograde was obtained and findings noted above.  Through the ureteral catheter a zipwire was advanced up to the renal pelvis. The cystoscope was then removed  we then removed the cystoscope and cannulated the left ureteral orifice with a semirigid ureteroscope.  We located the stone in the proximal ureter at the UPJ. Using a NGage basket the stone was removed. We elected to not leave a stent since this was an uncomplicated  ureteroscopy. We then turned out attention to the right side.  a 6 french ureteral catheter was then instilled into the right ureteral orifice.  a gentle retrograde was obtained and findings noted above. Through the ureteral catheter a zipwire was advanced up to the renal pelvis.  we then removed the cystoscope and cannulated the left ureteral orifice with a semirigid ureteroscope.  We located the stone in the distal ureter. Using a 200 nm laser fiber and fragmented the stone into smaller pieces.  the pieces were then removed with a engage basket.   once all stone fragments were removed we then placed a 6 x 24 double-j ureteral stent over the original zip wire.  We then removed the wire and good coil was noted in the the renal pelvis under fluoroscopy and the bladder under direct vision. the stone fragments were then removed from the bladder and sent for analysis.   the bladder was then drained and this concluded the procedure which was well tolerated by patient.  Complications: None  Condition: Stable, extubated, transferred to PACU  Plan: Patient is to be discharged home as to follow-up in 1 weeks for stent removal.

## 2020-01-17 NOTE — Anesthesia Postprocedure Evaluation (Addendum)
Anesthesia Post Note  Patient: [name] Bass  Procedure(s) Performed: CYSTOSCOPY WITH RETROGRADE PYELOGRAM, URETEROSCOPY bilateral right double j stent placement (Bilateral Ureter) HOLMIUM LASER APPLICATION (Bilateral )     Patient location during evaluation: PACU Anesthesia Type: General Level of consciousness: awake and alert Pain management: pain level controlled Vital Signs Assessment: post-procedure vital signs reviewed and stable Respiratory status: spontaneous breathing, nonlabored ventilation, respiratory function stable and patient connected to nasal cannula oxygen Cardiovascular status: blood pressure returned to baseline and stable Postop Assessment: no apparent nausea or vomiting Anesthetic complications: no Comments: Pt complaining of pain of out of proportion to typical post procedure levels despite multi-modal intervention. Surgeon notified admission may be warranted for further monitoring.     Last Vitals:  Vitals:   01/17/20 0930 01/17/20 0945  BP: 126/80 118/76  Pulse: 72 73  Resp: 20 11  Temp:    SpO2: 100% 100%    Last Pain:  Vitals:   01/17/20 0918  TempSrc:   PainSc: 10-Worst pain ever                 Shelton Silvas

## 2020-01-17 NOTE — H&P (Signed)
Urology Admission H&P  Chief Complaint: left flank pain  History of Present Illness: Ms Gettinger is a 34yo with a hx of nephrolithiasis who presented to my office yesterday with a 3 day history of left flank pain. Last stone event was 2-3 years ago. She underwent a CT stone study which showed a right 6-79mm distal ureteral calculus and a left 3-4 mm proximal ureteral calculus. No flank pain currently. She has worsening urinary frequency, urgency. No fevers  Past Medical History:  Diagnosis Date  . Abnormal Pap smear   . Bacterial vaginosis 04/29/04  . Chronic kidney disease    kidney stones  . CIN I (cervical intraepithelial neoplasia I)   . Dyspareunia 05/02/10  . Dysuria 04/04/05  . FHx: suicide   . H/O amenorrhea 02/19/04  . Herpes simplex type 2 infection    "last outbreak 3 weeks ago"  . Herpes simplex without mention of complication   . 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 History of kidney stones   . Hx: UTI (urinary tract infection)   . Hypertension   . Infection    UTI  . Irregular periods/menstrual cycles   . Polycystic ovarian syndrome 05/08/05  . Vaginal Pap smear, abnormal   . Vaginitis and vulvovaginitis 04/29/04   Past Surgical History:  Procedure Laterality Date  . CHOLECYSTECTOMY, LAPAROSCOPIC  2010  . CYSTOSCOPY WITH URETEROSCOPY  03/28/2012   Procedure: CYSTOSCOPY WITH URETEROSCOPY;  Surgeon: Marcine Matar, MD;  Location: [facility];  Service: Urology;  Laterality: Right;  RIGHT URETEROSCOPIC STONE EXTRACTION W/ LASER (Pt is [redacted] wk pregnant)    . EXTRACORPOREAL SHOCK WAVE LITHOTRIPSY Left 11/09/2016   Procedure: EXTRACORPOREAL SHOCK WAVE LITHOTRIPSY (ESWL);  Surgeon: Crist Fat, MD;  Location: [facility];  Service: Urology;  Laterality: Left;  . STONE EXTRACTION WITH BASKET  03/28/2012   Procedure: STONE EXTRACTION WITH BASKET;  Surgeon: Marcine Matar, MD;  Location: [facility];  Service: Urology;  Laterality: Right;  . TONSILLECTOMY  2006  . TUBAL LIGATION   08/04/2012   Procedure: POST PARTUM TUBAL LIGATION;  Surgeon: Hal Morales, MD;  Location: [facility];  Service: Gynecology;  Laterality: Bilateral;  Bilateral post partum tubal ligation  . WISDOM TOOTH EXTRACTION  2001    Home Medications:  Current Facility-Administered Medications  Medication Dose Route Frequency Provider Last Rate Last Admin  . ceFAZolin (ANCEF) 2-4 GM/100ML-% IVPB           . ceFAZolin (ANCEF) IVPB 2g/100 mL premix  2 g Intravenous 30 min Pre-Op Dominik Lauricella, Mardene Celeste, MD      . lactated ringers infusion   Intravenous Continuous Shelton Silvas, MD 50 mL/hr at 01/17/20 9735 New Bag at 01/17/20 3299   Allergies:  Allergies  Allergen Reactions  . Amoxicillin Rash and Other (See Comments)    Has patient had a PCN reaction causing immediate rash, facial/tongue/throat swelling, SOB or lightheadedness with hypotension: No Has patient had a PCN reaction causing severe rash involving mucus membranes or skin necrosis: No Has patient had a PCN reaction that required hospitalization No Has patient had a PCN reaction occurring within the last 10 years: No If all of the above answers are "NO", then may proceed with Cephalosporin use.  . Eggs Or Egg-Derived Products Rash    Family History  Problem Relation Age of Onset  . Heart disease Father        heart attack in oct.  lm  . Cancer Maternal Uncle  bone cancer  . Diabetes Maternal Uncle   . Cancer Maternal Grandmother        ovarian cancer  . Suicidality Mother   . Diabetes Maternal Aunt   . Cancer Maternal Aunt        lung  . Hypertension Paternal Uncle    Social History:  reports that she has quit smoking. She has never used smokeless tobacco. She reports current alcohol use. She reports that she does not use drugs.  Review of Systems  Genitourinary: Positive for flank pain.  All other systems reviewed and are negative.   Physical Exam:  Vital signs in last 24 hours: Temp:  [98.3 F (36.8 C)] 98.3 F  (36.8 C) (05/29 0634) Pulse Rate:  [78] 78 (05/29 0634) Resp:  [18] 18 (05/29 0634) BP: (130)/(83) 130/83 (05/29 0634) SpO2:  [98 %] 98 % (05/29 0634) Weight:  [81.6 kg-82.4 kg] 82.4 kg (05/29 0629) Physical Exam  Constitutional: She is oriented to person, place, and time. She appears well-developed and well-nourished.  HENT:  Head: Normocephalic and atraumatic.  Eyes: Pupils are equal, round, and reactive to light. EOM are normal.  Neck: No thyromegaly present.  Cardiovascular: Normal rate and regular rhythm.  Respiratory: Effort normal. No respiratory distress.  GI: Soft. She exhibits no distension.  Musculoskeletal:        General: No edema. Normal range of motion.     Cervical back: Normal range of motion.  Neurological: She is alert and oriented to person, place, and time.  Skin: Skin is warm and dry.  Psychiatric: She has a normal mood and affect. Her behavior is normal. Judgment and thought content normal.    Laboratory Data:  Results for orders placed or performed during the hospital encounter of 01/17/20 (from the past 24 hour(s))  CBC     Status: Abnormal   Collection Time: 01/17/20  6:22 AM  Result Value Ref Range   WBC 11.0 (H) 4.0 - 10.5 K/uL   RBC 4.73 3.87 - 5.11 MIL/uL   Hemoglobin 15.1 (H) 12.0 - 15.0 g/dL   HCT 44.3 36.0 - 46.0 %   MCV 93.7 80.0 - 100.0 fL   MCH 31.9 26.0 - 34.0 pg   MCHC 34.1 30.0 - 36.0 g/dL   RDW 12.2 11.5 - 15.5 %   Platelets 302 150 - 400 K/uL   nRBC 0.0 0.0 - 0.2 %  hCG, serum, qualitative     Status: None   Collection Time: 01/17/20  6:22 AM  Result Value Ref Range   Preg, Serum NEGATIVE NEGATIVE   Recent Results (from the past 240 hour(s))  SARS CORONAVIRUS 2 (TAT 6-24 HRS) Nasopharyngeal Nasopharyngeal Swab     Status: None   Collection Time: 01/16/20  2:49 PM   Specimen: Nasopharyngeal Swab  Result Value Ref Range Status   SARS Coronavirus 2 NEGATIVE NEGATIVE Final    Comment: (NOTE) SARS-CoV-2 target nucleic acids are  NOT DETECTED. The SARS-CoV-2 RNA is generally detectable in upper and lower respiratory specimens during the acute phase of infection. Negative results do not preclude SARS-CoV-2 infection, do not rule out co-infections with other pathogens, and should not be used as the sole basis for treatment or other patient management decisions. Negative results must be combined with clinical observations, patient history, and epidemiological information. The expected result is Negative. Fact Sheet for Patients: SugarRoll.be Fact Sheet for Healthcare Providers: https://www.woods-mathews.com/ This test is not yet approved or cleared by the [place] FDA and  has been  authorized for detection and/or diagnosis of SARS-CoV-2 by FDA under an Emergency Use Authorization (EUA). This EUA will remain  in effect (meaning this test can be used) for the duration of the COVID-19 declaration under Section 56 4(b)(1) of the Act, 21 U.S.C. section 360bbb-3(b)(1), unless the authorization is terminated or revoked sooner. Performed at [facility], [address] [place], [place] [address]    Creatinine: No results for input(s): CREATININE in the last 168 hours. Baseline Creatinine: unknown  Impression/Assessment:  34yo with bilateral ureteral calculi  Plan:  The risks/benefits/alternatives to bilateral ureteroscopic stone extraction was explained to the patient and she understands and wishes to proceed with surgery  Wilkie Aye 01/17/2020, 7:12 AM

## 2020-01-17 NOTE — Progress Notes (Addendum)
Pt calling out in pain.  Complaining of severe pain on left flank and left abdomen upon arrival to department.  Stent placement was on right.  Pt attempted to urinate and continued to cry out in pain.  Assisted pt back to bed and called MD.  Warm compresses applied to areas. Bladder scanned pt and max scan amount was 252 ml.  Orders received for B&O suppository. Nino Parsley

## 2020-01-17 NOTE — Progress Notes (Addendum)
EKG placed in surgical chart.  Normal sinus rhythm.  Angie, CRNA and Dr. Hart Rochester, Anesthesiologist notified.

## 2020-01-17 NOTE — Anesthesia Preprocedure Evaluation (Signed)
Anesthesia Evaluation  Patient identified by MRN, date of birth, ID band Patient awake    Reviewed: Allergy & Precautions, NPO status , Patient's Chart, lab work & pertinent test results  Airway Mallampati: II  TM Distance: >3 FB Neck ROM: Full    Dental  (+) Teeth Intact, Dental Advisory Given   Pulmonary former smoker,    breath sounds clear to auscultation       Cardiovascular hypertension, Pt. on medications  Rhythm:Regular Rate:Normal     Neuro/Psych  Headaches, negative psych ROS   GI/Hepatic negative GI ROS, Neg liver ROS,   Endo/Other  negative endocrine ROS  Renal/GU      Musculoskeletal negative musculoskeletal ROS (+)   Abdominal Normal abdominal exam  (+)   Peds  Hematology negative hematology ROS (+)   Anesthesia Other Findings   Reproductive/Obstetrics                             Anesthesia Physical Anesthesia Plan  ASA: II  Anesthesia Plan: General   Post-op Pain Management:    Induction: Intravenous  PONV Risk Score and Plan: 4 or greater and Ondansetron, Dexamethasone, Midazolam and Scopolamine patch - Pre-op  Airway Management Planned: LMA  Additional Equipment: None  Intra-op Plan:   Post-operative Plan: Extubation in OR  Informed Consent: I have reviewed the patients History and Physical, chart, labs and discussed the procedure including the risks, benefits and alternatives for the proposed anesthesia with the patient or authorized representative who has indicated his/her understanding and acceptance.     Dental advisory given  Plan Discussed with: CRNA  Anesthesia Plan Comments:         Anesthesia Quick Evaluation

## 2020-01-18 NOTE — Discharge Summary (Signed)
Date of admission: 01/17/2020  Date of discharge: 01/18/2020  Admission diagnosis: Bilateral ureteral stones  Discharge diagnosis: Same  Secondary diagnoses:  Patient Active Problem List   Diagnosis Date Noted  . Nephrolithiasis 01/17/2020  . Constipation 03/04/2015  . Enteritis 03/03/2015  . Hypokalemia 03/03/2015  . Ileus ([place]) 03/03/2015  . Abdominal pain 03/03/2015  . Headache 03/03/2015  . Vaginal delivery 08/04/2012  . Bilateral kidney stones 07/08/2012  . Abnormal glucose tolerance test in pregnancy, antepartum 07/08/2012  . Unsure of LMP  07/08/2012  . Normal pregnancy 01/17/2012  . Abnormal Pap smear   . Polycystic ovarian syndrome   . CIN I (cervical intraepithelial neoplasia I)   . Herpes simplex without mention of complication   . FHx: suicide   . HSV (herpes simplex virus) anogenital infection 12/02/2011  . High risk HPV infection 11/28/2011  . History of PCOS 11/28/2011  . Cervical intraepithelial neoplasia I 11/28/2011  . Lower abdominal pain 02/28/2011    Procedures performed: Procedure(s): CYSTOSCOPY WITH RETROGRADE PYELOGRAM, URETEROSCOPY bilateral right double j stent placement HOLMIUM LASER APPLICATION  History and Physical: For full details, please see admission history and physical. Briefly, [name] Bass is a 34 y.o. year old patient with bilateral obstructing renal stones and poorly controlled pain.   Hospital Course: Patient tolerated the procedure well.  The patient was scheduled to be discharged home following the surgery, but was admitted for observation because of persistent left-sided flank pain.  She was treated with IV pain medication which improved her pain significantly.  She was then transferred to the floor after an uneventful PACU stay.  Her hospital course was uncomplicated.  On POD#1 she had met discharge criteria: was eating a regular diet, was up and ambulating independently,  pain was well controlled, was voiding without a catheter,  and was ready to for discharge.  PE: No acute distress Vitals with BMI 01/18/2020 01/18/2020 01/17/2020  Height - - -  Weight - - -  BMI - - -  Systolic [phone]  Diastolic 69 60 64  Pulse 78 75 64  Nonlabored breathing Abdomen soft Extremities symmetric  Laboratory values:  Recent Labs    01/17/20 0622  WBC 11.0*  HGB 15.1*  HCT 44.3   Recent Labs    01/17/20 0622  NA 136  K 3.5  CL 103  CO2 27  GLUCOSE 101*  BUN 14  CREATININE 0.77  CALCIUM 9.1   No results for input(s): LABPT, INR in the last 72 hours. No results for input(s): LABURIN in the last 72 hours. Results for orders placed or performed during the hospital encounter of 01/16/20  SARS CORONAVIRUS 2 (TAT 6-24 HRS) Nasopharyngeal Nasopharyngeal Swab     Status: None   Collection Time: 01/16/20  2:49 PM   Specimen: Nasopharyngeal Swab  Result Value Ref Range Status   SARS Coronavirus 2 NEGATIVE NEGATIVE Final    Comment: (NOTE) SARS-CoV-2 target nucleic acids are NOT DETECTED. The SARS-CoV-2 RNA is generally detectable in upper and lower respiratory specimens during the acute phase of infection. Negative results do not preclude SARS-CoV-2 infection, do not rule out co-infections with other pathogens, and should not be used as the sole basis for treatment or other patient management decisions. Negative results must be combined with clinical observations, patient history, and epidemiological information. The expected result is Negative. Fact Sheet for Patients: SugarRoll.be Fact Sheet for Healthcare Providers: https://www.woods-mathews.com/ This test is not yet approved or cleared by the [place] FDA and  has been authorized for detection and/or diagnosis of SARS-CoV-2 by FDA under an Emergency Use Authorization (EUA). This EUA will remain  in effect (meaning this test can be used) for the duration of the COVID-19 declaration under Section 56 4(b)(1) of the  Act, 21 U.S.C. section 360bbb-3(b)(1), unless the authorization is terminated or revoked sooner. Performed at [facility], [place] [address], [place], [place] [address]     Disposition: Home  Discharge instruction: The patient was instructed to be ambulatory but told to refrain from heavy lifting, strenuous activity, or driving.   Discharge medications:  Allergies as of 01/18/2020      Reactions   Amoxicillin Rash, Other (See Comments)   Has patient had a PCN reaction causing immediate rash, facial/tongue/throat swelling, SOB or lightheadedness with hypotension: No Has patient had a PCN reaction causing severe rash involving mucus membranes or skin necrosis: No Has patient had a PCN reaction that required hospitalization No Has patient had a PCN reaction occurring within the last 10 years: No If all of the above answers are "NO", then may proceed with Cephalosporin use.   Eggs Or Egg-derived Products Rash      Medication List    TAKE these medications   acetaminophen 500 MG tablet Commonly known as: TYLENOL Take 1,500 mg by mouth every 6 (six) hours as needed for mild pain, moderate pain or headache.   ciprofloxacin 500 MG tablet Commonly known as: CIPRO Take 500 mg by mouth 2 (two) times daily. Pt takes with food   metroNIDAZOLE 500 MG tablet Commonly known as: FLAGYL Take 500 mg by mouth 2 (two) times daily. Pt takes with food   multivitamin with minerals tablet Take 1 tablet by mouth daily.   NIFEdipine 30 MG 24 hr tablet Commonly known as: PROCARDIA-XL/NIFEDICAL-XL Take 30 mg by mouth daily. Pt takes in the pm   ondansetron 4 MG tablet Commonly known as: Zofran Take 1 tablet (4 mg total) by mouth every 8 (eight) hours as needed for nausea or vomiting.   oxyCODONE-acetaminophen 5-325 MG tablet Commonly known as: Percocet Take 1 tablet by mouth every 4 (four) hours as needed for severe pain.   phenazopyridine 100 MG tablet Commonly known as: Pyridium Take  1 tablet (100 mg total) by mouth 3 (three) times daily as needed for pain.   Vitamin D3 50 MCG (2000 UT) Tabs Take 2,000 Units by mouth every other day.       Followup:  Follow-up Information    McKenzie, Candee Furbish, MD. Call in 1 week.   Specialty: Urology Contact information: [address] [place] [address] [phone]

## 2020-01-19 ENCOUNTER — Emergency Department (HOSPITAL_COMMUNITY)
Admission: EM | Admit: 2020-01-19 | Discharge: 2020-01-19 | Disposition: A | Discharge status: Home / Self Care | Payer: Self-pay | Attending: Emergency Medicine | Admitting: Emergency Medicine

## 2020-01-19 ENCOUNTER — Other Ambulatory Visit: Payer: Self-pay

## 2020-01-19 ENCOUNTER — Encounter (HOSPITAL_COMMUNITY): Payer: Self-pay

## 2020-01-19 ENCOUNTER — Emergency Department (HOSPITAL_COMMUNITY): Payer: Self-pay

## 2020-01-19 ENCOUNTER — Telehealth: Payer: Self-pay | Admitting: Urology

## 2020-01-19 DIAGNOSIS — I10 Essential (primary) hypertension: Secondary | ICD-10-CM | POA: Insufficient documentation

## 2020-01-19 DIAGNOSIS — Z87891 Personal history of nicotine dependence: Secondary | ICD-10-CM | POA: Insufficient documentation

## 2020-01-19 DIAGNOSIS — N200 Calculus of kidney: Secondary | ICD-10-CM

## 2020-01-19 DIAGNOSIS — Z20822 Contact with and (suspected) exposure to covid-19: Secondary | ICD-10-CM | POA: Insufficient documentation

## 2020-01-19 LAB — URINALYSIS, ROUTINE W REFLEX MICROSCOPIC
Bilirubin Urine: NEGATIVE
Glucose, UA: NEGATIVE mg/dL
Ketones, ur: NEGATIVE mg/dL
Nitrite: POSITIVE — AB
Protein, ur: 100 mg/dL — AB
RBC / HPF: >50 RBC/hpf — ABNORMAL HIGH (ref 0–5)
Specific Gravity, Urine: 1.014 (ref 1.005–1.030)
pH: 7 (ref 5.0–8.0)

## 2020-01-19 LAB — BASIC METABOLIC PANEL
Anion gap: 7 (ref 5–15)
BUN: 14 mg/dL (ref 6–20)
CO2: 26 mmol/L (ref 22–32)
Calcium: 8.8 mg/dL — ABNORMAL LOW (ref 8.9–10.3)
Chloride: 104 mmol/L (ref 98–111)
Creatinine, Ser: 0.85 mg/dL (ref 0.44–1.00)
GFR calc Af Amer: >60 mL/min (ref >60)
GFR calc non Af Amer: >60 mL/min (ref >60)
Glucose, Bld: 92 mg/dL (ref 70–99)
Potassium: 3.7 mmol/L (ref 3.5–5.1)
Sodium: 137 mmol/L (ref 135–145)

## 2020-01-19 LAB — CBC
HCT: 42.2 % (ref 36.0–46.0)
Hemoglobin: 14.2 g/dL (ref 12.0–15.0)
MCH: 31.8 pg (ref 26.0–34.0)
MCHC: 33.6 g/dL (ref 30.0–36.0)
MCV: 94.4 fL (ref 80.0–100.0)
Platelets: 276 10*3/uL (ref 150–400)
RBC: 4.47 MIL/uL (ref 3.87–5.11)
RDW: 12.1 % (ref 11.5–15.5)
WBC: 11.4 10*3/uL — ABNORMAL HIGH (ref 4.0–10.5)
nRBC: 0 % (ref 0.0–0.2)

## 2020-01-19 LAB — I-STAT BETA HCG BLOOD, ED (MC, WL, AP ONLY): I-stat hCG, quantitative: <5 m[IU]/mL (ref <5)

## 2020-01-19 LAB — SARS CORONAVIRUS 2 BY RT PCR (HOSPITAL ORDER, PERFORMED IN ~~LOC~~ HOSPITAL LAB): SARS Coronavirus 2: NEGATIVE

## 2020-01-19 MED ORDER — KETOROLAC TROMETHAMINE 15 MG/ML IJ SOLN
15.0000 mg | Freq: Once | INTRAMUSCULAR | Status: AC
  Administered 2020-01-19: 15 mg via INTRAVENOUS
  Filled 2020-01-19: qty 1

## 2020-01-19 MED ORDER — HYDROMORPHONE HCL 1 MG/ML IJ SOLN
0.5000 mg | Freq: Once | INTRAMUSCULAR | Status: AC
  Administered 2020-01-19: 0.5 mg via INTRAVENOUS
  Filled 2020-01-19: qty 1

## 2020-01-19 MED ORDER — TAMSULOSIN HCL 0.4 MG PO CAPS
0.4000 mg | ORAL_CAPSULE | Freq: Once | ORAL | Status: AC
  Administered 2020-01-19: 0.4 mg via ORAL
  Filled 2020-01-19: qty 1

## 2020-01-19 MED ORDER — HYDROMORPHONE HCL 1 MG/ML IJ SOLN
1.0000 mg | Freq: Once | INTRAMUSCULAR | Status: AC
  Administered 2020-01-19: 1 mg via INTRAVENOUS
  Filled 2020-01-19: qty 1

## 2020-01-19 MED ORDER — SODIUM CHLORIDE 0.9 % IV BOLUS
1000.0000 mL | Freq: Once | INTRAVENOUS | Status: AC
  Administered 2020-01-19: 1000 mL via INTRAVENOUS

## 2020-01-19 MED ORDER — TAMSULOSIN HCL 0.4 MG PO CAPS
0.4000 mg | ORAL_CAPSULE | Freq: Every day | ORAL | 0 refills | Status: DC
Start: 2020-01-19 — End: 2021-10-05

## 2020-01-19 NOTE — ED Triage Notes (Signed)
Pt c/o Left flank pain since Sat after having kidney stone surgery at [facility] by Dr. Ronne Binning. Pt endorses increased pain, bloody urine, and nausea.

## 2020-01-19 NOTE — Consult Note (Signed)
Patient was discharged home yesterday after undergoing bilateral ureteroscopy.  She had a difficult time getting the pain controlled on her left side immediately postoperatively.  However, on the day of discharge her pain was significantly better controlled.  She called me today complaining of severe and worsening left-sided flank pain.  She then presented to the emergency department where CT scan demonstrated a small 2 mm residual UVJ stone.  The patient was in extreme pain initially, but was given Toradol and the pain subsequently receded.  Given the size of the stone, I advised the emergency room physician that this would likely be something that the patient could pass, and if given adequate pain control, she could be discharged home.  Otherwise, the patient would need to be taken to the operating room for stone extraction.  If the patient is indeed discharged home, would recommend that she be given strong pain medication to prevent her from returning to the emergency room for pain.  She also would benefit from Flomax.

## 2020-01-19 NOTE — Discharge Instructions (Addendum)
You were evaluated in the Emergency Department and after careful evaluation, we did not find any emergent condition requiring admission or further testing in the hospital.  Your exam/testing today was overall reassuring.  You should be able to pass his kidney stone on your own.  Please take the pain medicine you have at home as we discussed.  Use the Flomax daily.  Continue taking your antibiotics.  Please return to the Emergency Department if you experience any worsening of your condition.  We encourage you to follow up with a primary care provider.  Thank you for allowing [place] to be a part of your care.

## 2020-01-19 NOTE — Telephone Encounter (Signed)
The patient was discharged yesterday after undergoing bilateral ureteroscopy, left stent placement.  She was doing well but today developed severe right-sided pain.  She did have a large right ureteral stone that was treated with ureteroscopy, laser lithotripsy and stone removal.  She does not have a stent on that side.  She contacted me complaining of severe pain.  By the time I called her back she was on her way to the emergency department.  The stone is radiopaque enough that it would be easily visualized with an x-ray.  This may be a good start for her evaluation to see if she got any residual stones.  Renal ultrasound would also be helpful to see if she is got hydronephrosis.  Giving her Toradol would be helpful for inflammation as well as pain.

## 2020-01-19 NOTE — ED Provider Notes (Signed)
[facility]EMERGENCY DEPT [facility] Emergency Department Provider Note MRN:  [id number]  Arrival date & time: 01/19/20     Chief Complaint   Flank Pain (Left)   History of Present Illness   [name] Bass is a 34 y.o. year-old female with a history of kidney stones presenting to the ED with chief complaint of flank pain.  Patient explains that she had surgery for her kidney stones 2 days ago.  She had adequate control of her pain and was discharged.  Pain is getting much worse.  Pain prior to the surgery was in the bilateral flanks but more recently is now only on the left side.  Radiates down to her left lower quadrant.  Denies dysuria.  Endorsing hematuria.  No fever, no chest pain or shortness of breath.  Pain is severe, 9 out of 10.  Not improved with home oxycodone.  Has been taking antibiotics as directed.  Review of Systems  A complete 10 system review of systems was obtained and all systems are negative except as noted in the HPI and PMH.   Patient's Health History    Past Medical History:  Diagnosis Date  . Abnormal Pap smear   . Bacterial vaginosis 04/29/04  . Chronic kidney disease    kidney stones  . CIN I (cervical intraepithelial neoplasia I)   . Dyspareunia 05/02/10  . Dysuria 04/04/05  . FHx: suicide   . H/O amenorrhea 02/19/04  . Herpes simplex type 2 infection    "last outbreak 3 weeks ago"  . Herpes simplex without mention of complication   . 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 History of kidney stones   . Hx: UTI (urinary tract infection)   . Hypertension   . Infection    UTI  . Irregular periods/menstrual cycles   . Polycystic ovarian syndrome 05/08/05  . Vaginal Pap smear, abnormal   . Vaginitis and vulvovaginitis 04/29/04    Past Surgical History:  Procedure Laterality Date  . CHOLECYSTECTOMY, LAPAROSCOPIC  2010  . CYSTOSCOPY WITH RETROGRADE PYELOGRAM, URETEROSCOPY AND STENT PLACEMENT Bilateral 01/17/2020   Procedure: CYSTOSCOPY WITH RETROGRADE  PYELOGRAM, URETEROSCOPY bilateral right double j stent placement;  Surgeon: Malen Gauze, MD;  Location: [facility];  Service: Urology;  Laterality: Bilateral;  1 HR  . CYSTOSCOPY WITH URETEROSCOPY  03/28/2012   Procedure: CYSTOSCOPY WITH URETEROSCOPY;  Surgeon: Marcine Matar, MD;  Location: [facility];  Service: Urology;  Laterality: Right;  RIGHT URETEROSCOPIC STONE EXTRACTION W/ LASER (Pt is [redacted] wk pregnant)    . EXTRACORPOREAL SHOCK WAVE LITHOTRIPSY Left 11/09/2016   Procedure: EXTRACORPOREAL SHOCK WAVE LITHOTRIPSY (ESWL);  Surgeon: Crist Fat, MD;  Location: [facility];  Service: Urology;  Laterality: Left;  . HOLMIUM LASER APPLICATION Bilateral 01/17/2020   Procedure: HOLMIUM LASER APPLICATION;  Surgeon: Malen Gauze, MD;  Location: [facility];  Service: Urology;  Laterality: Bilateral;  . STONE EXTRACTION WITH BASKET  03/28/2012   Procedure: STONE EXTRACTION WITH BASKET;  Surgeon: Marcine Matar, MD;  Location: [facility];  Service: Urology;  Laterality: Right;  . TONSILLECTOMY  2006  . TUBAL LIGATION  08/04/2012   Procedure: POST PARTUM TUBAL LIGATION;  Surgeon: Hal Morales, MD;  Location: [facility];  Service: Gynecology;  Laterality: Bilateral;  Bilateral post partum tubal ligation  . WISDOM TOOTH EXTRACTION  2001    Family History  Problem Relation Age of Onset  . Heart disease Father        heart attack in oct.  lm  .  Cancer Maternal Uncle        bone cancer  . Diabetes Maternal Uncle   . Cancer Maternal Grandmother        ovarian cancer  . Suicidality Mother   . Diabetes Maternal Aunt   . Cancer Maternal Aunt        lung  . Hypertension Paternal Uncle     Social History   Socioeconomic History  . Marital status: Single    Spouse name: Not on file  . Number of children: 3  . Years of education: 87  . Highest education level: Not on file  Occupational History  . Not on file  Tobacco Use  . Smoking status: Former Research scientist (life sciences)  . Smokeless tobacco: Never Used  .  Tobacco comment: Jan 2015  Substance and Sexual Activity  . Alcohol use: Yes    Alcohol/week: 0.0 standard drinks    Comment: occasional  . Drug use: No  . Sexual activity: Yes    Partners: Male    Birth control/protection: Surgical  Other Topics Concern  . Not on file  Social History Narrative  . Not on file   Social Determinants of Health   Financial Resource Strain:   . Difficulty of Paying Living Expenses:   Food Insecurity:   . Worried About Charity fundraiser in the Last Year:   . Arboriculturist in the Last Year:   Transportation Needs:   . Film/video editor (Medical):   Marland Kitchen Lack of Transportation (Non-Medical):   Physical Activity:   . Days of Exercise per Week:   . Minutes of Exercise per Session:   Stress:   . Feeling of Stress :   Social Connections:   . Frequency of Communication with Friends and Family:   . Frequency of Social Gatherings with Friends and Family:   . Attends Religious Services:   . Active Member of Clubs or Organizations:   . Attends Archivist Meetings:   Marland Kitchen Marital Status:   Intimate Partner Violence:   . Fear of Current or Ex-Partner:   . Emotionally Abused:   Marland Kitchen Physically Abused:   . Sexually Abused:      Physical Exam   Vitals:   01/19/20 1145 01/19/20 1345  BP: 125/75 128/78  Pulse: 79 77  Resp: 16 16  Temp:    SpO2: 97% 98%    CONSTITUTIONAL: Well-appearing, in moderate distress due to pain NEURO:  Alert and oriented x 3, no focal deficits EYES:  eyes equal and reactive ENT/NECK:  no LAD, no JVD CARDIO: Regular rate, well-perfused, normal S1 and S2 PULM:  CTAB no wheezing or rhonchi GI/GU:  normal bowel sounds, non-distended, non-tender; moderate left CVA tenderness MSK/SPINE:  No gross deformities, no edema SKIN:  no rash, atraumatic PSYCH:  Appropriate speech and behavior  *Additional and/or pertinent findings included in MDM below  Diagnostic and Interventional Summary    EKG  Interpretation  Date/Time:    Ventricular Rate:    PR Interval:    QRS Duration:   QT Interval:    QTC Calculation:   R Axis:     Text Interpretation:        Labs Reviewed  URINALYSIS, ROUTINE W REFLEX MICROSCOPIC - Abnormal; Notable for the following components:      Result Value   Color, Urine RED (*)    APPearance CLOUDY (*)    Hgb urine dipstick LARGE (*)    Protein, ur 100 (*)    Nitrite  POSITIVE (*)    Leukocytes,Ua SMALL (*)    RBC / HPF >50 (*)    Bacteria, UA RARE (*)    All other components within normal limits  CBC - Abnormal; Notable for the following components:   WBC 11.4 (*)    All other components within normal limits  BASIC METABOLIC PANEL - Abnormal; Notable for the following components:   Calcium 8.8 (*)    All other components within normal limits  SARS CORONAVIRUS 2 BY RT PCR (HOSPITAL ORDER, PERFORMED IN [facility])  I-STAT BETA HCG BLOOD, ED ([facility], [facility], [facility] ONLY)    CT RENAL STONE STUDY  Final Result      Medications  HYDROmorphone (DILAUDID) injection 1 mg (1 mg Intravenous Given 01/19/20 1108)  sodium chloride 0.9 % bolus 1,000 mL (1,000 mLs Intravenous New Bag/Given 01/19/20 1108)  ketorolac (TORADOL) 15 MG/ML injection 15 mg (15 mg Intravenous Given 01/19/20 1325)  HYDROmorphone (DILAUDID) injection 0.5 mg (0.5 mg Intravenous Given 01/19/20 1325)  tamsulosin (FLOMAX) capsule 0.4 mg (0.4 mg Oral Given 01/19/20 1325)     Procedures  /  Critical Care Procedures  ED Course and Medical Decision Making  I have reviewed the triage vital signs, the nursing notes, and pertinent available records from the EMR.  Listed above are laboratory and imaging tests that I personally ordered, reviewed, and interpreted and then considered in my medical decision making (see below for details).      Upon chart review, it seems that patient is postop day 2 from ureteroscopic stone manipulation bilaterally.  She also had a stent placed in the right  ureter.  She does not have right-sided pain, there is suspicion that she has continued obstruction on the left.  Hemodynamically stable, afebrile, will provide pain control, will obtain repeat CT imaging and likely consult [facility].  CT reveals 2 mm obstructing stone on the left.  Dr. Marlou Porch of [facility] was consulted.  After another round of pain medication patient was feeling much better, looks well, normal vital signs.  Per Dr. Marlou Porch this stone should easily pass on its own, patient is appropriate for discharge with symptomatic management, will continue outpatient antibiotics.  Elmer Sow. Pilar Plate, MD [facility] Emergency [facility] [email or web address]  Final Clinical Impressions(s) / ED Diagnoses     ICD-10-CM   1. Kidney stone  N20.0     ED Discharge Orders         Ordered    tamsulosin (FLOMAX) 0.4 MG CAPS capsule  Daily     01/19/20 1419           Discharge Instructions Discussed with and Provided to Patient:     Discharge Instructions     You were evaluated in the Emergency Department and after careful evaluation, we did not find any emergent condition requiring admission or further testing in the hospital.  Your exam/testing today was overall reassuring.  You should be able to pass his kidney stone on your own.  Please take the pain medicine you have at home as we discussed.  Use the Flomax daily.  Continue taking your antibiotics.  Please return to the Emergency Department if you experience any worsening of your condition.  We encourage you to follow up with a primary care provider.  Thank you for allowing [place] to be a part of your care.        Sabas Sous, MD 01/19/20 1421

## 2020-01-22 ENCOUNTER — Emergency Department (HOSPITAL_COMMUNITY): Payer: Self-pay

## 2020-01-22 ENCOUNTER — Emergency Department (HOSPITAL_COMMUNITY)
Admission: EM | Admit: 2020-01-22 | Discharge: 2020-01-22 | Disposition: A | Discharge status: Home / Self Care | Payer: Self-pay | Attending: Emergency Medicine | Admitting: Emergency Medicine

## 2020-01-22 ENCOUNTER — Encounter (HOSPITAL_COMMUNITY): Payer: Self-pay | Admitting: Emergency Medicine

## 2020-01-22 ENCOUNTER — Other Ambulatory Visit: Payer: Self-pay

## 2020-01-22 DIAGNOSIS — Z87891 Personal history of nicotine dependence: Secondary | ICD-10-CM | POA: Insufficient documentation

## 2020-01-22 DIAGNOSIS — Z960 Presence of urogenital implants: Secondary | ICD-10-CM | POA: Insufficient documentation

## 2020-01-22 DIAGNOSIS — N1330 Unspecified hydronephrosis: Secondary | ICD-10-CM

## 2020-01-22 DIAGNOSIS — I10 Essential (primary) hypertension: Secondary | ICD-10-CM | POA: Insufficient documentation

## 2020-01-22 DIAGNOSIS — R109 Unspecified abdominal pain: Secondary | ICD-10-CM

## 2020-01-22 DIAGNOSIS — E119 Type 2 diabetes mellitus without complications: Secondary | ICD-10-CM | POA: Insufficient documentation

## 2020-01-22 DIAGNOSIS — R300 Dysuria: Secondary | ICD-10-CM | POA: Insufficient documentation

## 2020-01-22 DIAGNOSIS — N200 Calculus of kidney: Secondary | ICD-10-CM

## 2020-01-22 LAB — COMPREHENSIVE METABOLIC PANEL
ALT: 34 U/L (ref 0–44)
AST: 20 U/L (ref 15–41)
Albumin: 4 g/dL (ref 3.5–5.0)
Alkaline Phosphatase: 53 U/L (ref 38–126)
Anion gap: 8 (ref 5–15)
BUN: 15 mg/dL (ref 6–20)
CO2: 27 mmol/L (ref 22–32)
Calcium: 8.9 mg/dL (ref 8.9–10.3)
Chloride: 104 mmol/L (ref 98–111)
Creatinine, Ser: 0.76 mg/dL (ref 0.44–1.00)
GFR calc Af Amer: >60 mL/min (ref >60)
GFR calc non Af Amer: >60 mL/min (ref >60)
Glucose, Bld: 111 mg/dL — ABNORMAL HIGH (ref 70–99)
Potassium: 3.8 mmol/L (ref 3.5–5.1)
Sodium: 139 mmol/L (ref 135–145)
Total Bilirubin: 0.7 mg/dL (ref 0.3–1.2)
Total Protein: 6.7 g/dL (ref 6.5–8.1)

## 2020-01-22 LAB — CBC WITH DIFFERENTIAL/PLATELET
Abs Immature Granulocytes: 0.03 10*3/uL (ref 0.00–0.07)
Basophils Absolute: 0 10*3/uL (ref 0.0–0.1)
Basophils Relative: 0 %
Eosinophils Absolute: 0.3 10*3/uL (ref 0.0–0.5)
Eosinophils Relative: 3 %
HCT: 38.8 % (ref 36.0–46.0)
Hemoglobin: 13.4 g/dL (ref 12.0–15.0)
Immature Granulocytes: 0 %
Lymphocytes Relative: 15 %
Lymphs Abs: 1.5 10*3/uL (ref 0.7–4.0)
MCH: 32.1 pg (ref 26.0–34.0)
MCHC: 34.5 g/dL (ref 30.0–36.0)
MCV: 93 fL (ref 80.0–100.0)
Monocytes Absolute: 0.5 10*3/uL (ref 0.1–1.0)
Monocytes Relative: 5 %
Neutro Abs: 7.9 10*3/uL — ABNORMAL HIGH (ref 1.7–7.7)
Neutrophils Relative %: 77 %
Platelets: 276 10*3/uL (ref 150–400)
RBC: 4.17 MIL/uL (ref 3.87–5.11)
RDW: 11.9 % (ref 11.5–15.5)
WBC: 10.3 10*3/uL (ref 4.0–10.5)
nRBC: 0 % (ref 0.0–0.2)

## 2020-01-22 LAB — I-STAT BETA HCG BLOOD, ED (MC, WL, AP ONLY): I-stat hCG, quantitative: <5 m[IU]/mL (ref <5)

## 2020-01-22 MED ORDER — OXYCODONE-ACETAMINOPHEN 5-325 MG PO TABS: 1.0000 | ORAL_TABLET | Freq: Four times a day (QID) | ORAL | 0 refills | Status: AC | PRN

## 2020-01-22 MED ORDER — KETOROLAC TROMETHAMINE 15 MG/ML IJ SOLN
15.0000 mg | Freq: Once | INTRAMUSCULAR | Status: AC
  Administered 2020-01-22: 15 mg via INTRAVENOUS
  Filled 2020-01-22: qty 1

## 2020-01-22 MED ORDER — NAPROXEN 500 MG PO TABS
500.0000 mg | ORAL_TABLET | Freq: Two times a day (BID) | ORAL | 0 refills | Status: DC | PRN
Start: 2020-01-22 — End: 2024-02-25

## 2020-01-22 MED ORDER — HYDROMORPHONE HCL 1 MG/ML IJ SOLN
0.5000 mg | Freq: Once | INTRAMUSCULAR | Status: AC
  Administered 2020-01-22: 0.5 mg via INTRAVENOUS
  Filled 2020-01-22: qty 1

## 2020-01-22 NOTE — ED Triage Notes (Signed)
Pt seen for kidney stones on Saturday and got a stent placed. Still having lots of bilat flank pain and pain with stent.

## 2020-01-22 NOTE — Discharge Instructions (Signed)
Please go to your urology clinic within the next 24 to 48 hours for recheck and to have your stent removed.  If you develop fever, uncontrolled pain, vomiting, return to ER for reassessment.  For pain recommend using naproxen and Percocet.  Note the Percocet can make him drowsy and should not be taken while driving or operating heavy machinery.

## 2020-01-22 NOTE — ED Provider Notes (Signed)
[facility]-EMERGENCY DEPT Provider Note   CSN: [id number] Arrival date & time: 01/22/20  0820     History Chief Complaint  Patient presents with  . Flank Pain    [name] Bass is a 34 y.o. female.  Presents to ER with concern for flank pain.  Patient reports that since Saturday she was having relatively constant left-sided pain however today started having worsening right-sided pain, right flank, right lower abdomen.  States also has significant pain with urination.  Denies any fever, no vomiting but has had nausea.  No pelvic pain, no vaginal bleeding or vaginal discharge.  Chart review, recent procedure with Dr. Ronne Binning from urology, placed double J ureteral stent on right side.  HPI     Past Medical History:  Diagnosis Date  . Abnormal Pap smear   . Bacterial vaginosis 04/29/04  . Chronic kidney disease    kidney stones  . CIN I (cervical intraepithelial neoplasia I)   . Dyspareunia 05/02/10  . Dysuria 04/04/05  . FHx: suicide   . H/O amenorrhea 02/19/04  . Herpes simplex type 2 infection    "last outbreak 3 weeks ago"  . Herpes simplex without mention of complication   . 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 History of kidney stones   . Hx: UTI (urinary tract infection)   . Hypertension   . Infection    UTI  . Irregular periods/menstrual cycles   . Polycystic ovarian syndrome 05/08/05  . Vaginal Pap smear, abnormal   . Vaginitis and vulvovaginitis 04/29/04    Patient Active Problem List   Diagnosis Date Noted  . Nephrolithiasis 01/17/2020  . Constipation 03/04/2015  . Enteritis 03/03/2015  . Hypokalemia 03/03/2015  . Ileus (HCC) 03/03/2015  . Abdominal pain 03/03/2015  . Headache 03/03/2015  . Vaginal delivery 08/04/2012  . Bilateral kidney stones 07/08/2012  . Abnormal glucose tolerance test in pregnancy, antepartum 07/08/2012  . Unsure of LMP  07/08/2012  . Normal pregnancy 01/17/2012  . Abnormal Pap smear   . Polycystic ovarian syndrome    . CIN I (cervical intraepithelial neoplasia I)   . Herpes simplex without mention of complication   . FHx: suicide   . HSV (herpes simplex virus) anogenital infection 12/02/2011  . High risk HPV infection 11/28/2011  . History of PCOS 11/28/2011  . Cervical intraepithelial neoplasia I 11/28/2011  . Lower abdominal pain 02/28/2011    Class: Acute    Past Surgical History:  Procedure Laterality Date  . CHOLECYSTECTOMY, LAPAROSCOPIC  2010  . CYSTOSCOPY WITH RETROGRADE PYELOGRAM, URETEROSCOPY AND STENT PLACEMENT Bilateral 01/17/2020   Procedure: CYSTOSCOPY WITH RETROGRADE PYELOGRAM, URETEROSCOPY bilateral right double j stent placement;  Surgeon: Malen Gauze, MD;  Location: [facility];  Service: Urology;  Laterality: Bilateral;  1 HR  . CYSTOSCOPY WITH URETEROSCOPY  03/28/2012   Procedure: CYSTOSCOPY WITH URETEROSCOPY;  Surgeon: Marcine Matar, MD;  Location: [facility];  Service: Urology;  Laterality: Right;  RIGHT URETEROSCOPIC STONE EXTRACTION W/ LASER (Pt is [redacted] wk pregnant)    . EXTRACORPOREAL SHOCK WAVE LITHOTRIPSY Left 11/09/2016   Procedure: EXTRACORPOREAL SHOCK WAVE LITHOTRIPSY (ESWL);  Surgeon: Crist Fat, MD;  Location: [facility];  Service: Urology;  Laterality: Left;  . HOLMIUM LASER APPLICATION Bilateral 01/17/2020   Procedure: HOLMIUM LASER APPLICATION;  Surgeon: Malen Gauze, MD;  Location: [facility];  Service: Urology;  Laterality: Bilateral;  . STONE EXTRACTION WITH BASKET  03/28/2012   Procedure: STONE EXTRACTION WITH BASKET;  Surgeon: Marcine Matar, MD;  Location: [facility];  Service: Urology;  Laterality: Right;  . TONSILLECTOMY  2006  . TUBAL LIGATION  08/04/2012   Procedure: POST PARTUM TUBAL LIGATION;  Surgeon: Hal Morales, MD;  Location: [facility];  Service: Gynecology;  Laterality: Bilateral;  Bilateral post partum tubal ligation  . WISDOM TOOTH EXTRACTION  2001     OB History    Gravida  3   Para  3   Term  3   Preterm      AB      Living    3     SAB      TAB      Ectopic      Multiple      Live Births  3           Family History  Problem Relation Age of Onset  . Heart disease Father        heart attack in oct.  lm  . Cancer Maternal Uncle        bone cancer  . Diabetes Maternal Uncle   . Cancer Maternal Grandmother        ovarian cancer  . Suicidality Mother   . Diabetes Maternal Aunt   . Cancer Maternal Aunt        lung  . Hypertension Paternal Uncle     Social History   Tobacco Use  . Smoking status: Former Games developer  . Smokeless tobacco: Never Used  . Tobacco comment: Jan 2015  Substance Use Topics  . Alcohol use: Yes    Alcohol/week: 0.0 standard drinks    Comment: occasional  . Drug use: No    Home Medications Prior to Admission medications   Medication Sig Start Date End Date Taking? Authorizing Provider  acetaminophen (TYLENOL) 500 MG tablet Take 1,500 mg by mouth every 6 (six) hours as needed for mild pain, moderate pain or headache.    Yes [provider]  Cholecalciferol (VITAMIN D3) 50 MCG (2000 UT) TABS Take 2,000 Units by mouth every other day.   Yes [provider]  Multiple Vitamins-Minerals (MULTIVITAMIN WITH MINERALS) tablet Take 1 tablet by mouth daily.   Yes [provider]  NIFEdipine (PROCARDIA-XL/NIFEDICAL-XL) 30 MG 24 hr tablet Take 30 mg by mouth every evening. Pt takes in the pm 12/23/19  Yes [provider]  ondansetron (ZOFRAN) 4 MG tablet Take 1 tablet (4 mg total) by mouth every 8 (eight) hours as needed for nausea or vomiting. 01/17/20 01/16/21 Yes McKenzie, Mardene Celeste, MD  phenazopyridine (PYRIDIUM) 100 MG tablet Take 1 tablet (100 mg total) by mouth 3 (three) times daily as needed for pain. 01/17/20  Yes McKenzie, Mardene Celeste, MD  tamsulosin (FLOMAX) 0.4 MG CAPS capsule Take 1 capsule (0.4 mg total) by mouth daily. Patient taking differently: Take 0.4 mg by mouth at bedtime.  01/19/20  Yes Sabas Sous, MD  ciprofloxacin (CIPRO) 500 MG  tablet Take 500 mg by mouth 2 (two) times daily. Pt takes with food    [provider]  metroNIDAZOLE (FLAGYL) 500 MG tablet Take 500 mg by mouth 2 (two) times daily. Pt takes with food 01/09/20   [provider]  naproxen (NAPROSYN) 500 MG tablet Take 1 tablet (500 mg total) by mouth 2 (two) times daily as needed for moderate pain. 01/22/20   Milagros Loll, MD  oxyCODONE-acetaminophen (PERCOCET) 5-325 MG tablet Take 1 tablet by mouth every 6 (six) hours as needed for up to 3 days for severe  pain. 01/22/20 01/25/20  Lucrezia Starch, MD    Allergies    Amoxicillin and Eggs or egg-derived products  Review of Systems   Review of Systems  Constitutional: Negative for chills and fever.  HENT: Negative for ear pain and sore throat.   Eyes: Negative for pain and visual disturbance.  Respiratory: Negative for cough and shortness of breath.   Cardiovascular: Negative for chest pain and palpitations.  Gastrointestinal: Positive for abdominal pain. Negative for vomiting.  Genitourinary: Positive for dysuria and flank pain. Negative for hematuria.  Musculoskeletal: Negative for arthralgias and back pain.  Skin: Negative for color change and rash.  Neurological: Negative for seizures and syncope.  All other systems reviewed and are negative.   Physical Exam Updated Vital Signs BP 119/67   Pulse 79   Temp 98.3 F (36.8 C)   Resp 19   LMP 12/24/2019 (Exact Date) Comment: tubal ligation  SpO2 98%   Physical Exam Vitals and nursing note reviewed.  Constitutional:      General: She is not in acute distress.    Appearance: She is well-developed.  HENT:     Head: Normocephalic and atraumatic.  Eyes:     Conjunctiva/sclera: Conjunctivae normal.  Cardiovascular:     Rate and Rhythm: Normal rate and regular rhythm.     Heart sounds: No murmur.  Pulmonary:     Effort: Pulmonary effort is normal. No respiratory distress.     Breath sounds: Normal breath sounds.  Abdominal:       Palpations: Abdomen is soft.     Comments: Tenderness to palpation in right lower quadrant, right flank, left flank  Musculoskeletal:        General: No deformity or signs of injury.     Cervical back: Neck supple.  Skin:    General: Skin is warm and dry.     Capillary Refill: Capillary refill takes less than 2 seconds.  Neurological:     General: No focal deficit present.     Mental Status: She is alert.  Psychiatric:        Mood and Affect: Mood normal.        Behavior: Behavior normal.     ED Results / Procedures / Treatments   Labs (all labs ordered are listed, but only abnormal results are displayed) Labs Reviewed  CBC WITH DIFFERENTIAL/PLATELET - Abnormal; Notable for the following components:      Result Value   Neutro Abs 7.9 (*)    All other components within normal limits  COMPREHENSIVE METABOLIC PANEL - Abnormal; Notable for the following components:   Glucose, Bld 111 (*)    All other components within normal limits  I-STAT BETA HCG BLOOD, ED ([facility], [facility], [facility] ONLY)    EKG None  Radiology CT Renal Stone Study  Result Date: 01/22/2020 CLINICAL DATA:  Right flank pain. History of recent stent placement. EXAM: CT ABDOMEN AND PELVIS WITHOUT CONTRAST TECHNIQUE: Multidetector CT imaging of the abdomen and pelvis was performed following the standard protocol without IV contrast. COMPARISON:  CT scan 01/19/2020 FINDINGS: Lower chest: Minimal streaky dependent subpleural atelectasis. No infiltrates or effusions. The heart is normal in size. No pericardial effusion. Hepatobiliary: No hepatic lesions are identified without contrast. The gallbladder is surgically absent. No intra or extrahepatic biliary dilatation. Pancreas: No mass, inflammation or ductal dilatation. Spleen: Normal size.  No focal lesions. Adrenals/Urinary Tract: The adrenal glands are unremarkable. Stable small bilateral renal calculi. Persistent moderate to marked right-sided hydroureteronephrosis. There is a  double-J ureteral stent in place but the proximal loop is in the mid to upper right ureter. The lower loop is in the bladder. This appears stable. Moderate left-sided hydronephrosis is also again demonstrated. No left-sided hydroureter. No definite ureteral obstruction. No significant bladder findings. Stomach/Bowel: The stomach, duodenum, small bowel and colon are grossly normal without oral contrast. No inflammatory changes, mass lesions or obstructive findings. The appendix is normal. Vascular/Lymphatic: The aorta is normal in caliber. No atheroscerlotic calcifications. No mesenteric of retroperitoneal mass or adenopathy. Small scattered lymph nodes are noted. Reproductive: The uterus and ovaries are unremarkable and stable. Other: No free pelvic fluid collections.  No abdominal wall hernia. Musculoskeletal: No significant bony findings. IMPRESSION: 1. Persistent moderate to marked right-sided hydroureteronephrosis despite the presence of a double-J ureteral stent. The proximal loop is in the mid to upper right ureter. The lower loop is in the bladder. 2. Stable moderate left-sided hydronephrosis. No definite ureteral obstruction. 3. No acute abdominal/pelvic findings, mass lesions or adenopathy. 4. Status post cholecystectomy. No biliary dilatation. Electronically Signed   By: Rudie Meyer M.D.   On: 01/22/2020 10:58    Procedures Procedures (including critical care time)  Medications Ordered in ED Medications  ketorolac (TORADOL) 15 MG/ML injection 15 mg (15 mg Intravenous Given 01/22/20 0922)  HYDROmorphone (DILAUDID) injection 0.5 mg (0.5 mg Intravenous Given 01/22/20 4696)    ED Course  I have reviewed the triage vital signs and the nursing notes.  Pertinent labs & imaging results that were available during my care of the patient were reviewed by me and considered in my medical decision making (see chart for details).  Clinical Course as of Jan 21 1238  Thu Jan 22, 2020  1131 D/w [facility],  rec close f/u in office for recheck, stent removal   [RD]    Clinical Course User Index [RD] Milagros Loll, MD   MDM Rules/Calculators/A&P                      35 year old female who presents to ER with concern for right flank pain.  Recent stent placement on right side by urology.  Given symptoms, check labs, urine, CT to further evaluate.  Labs normal, no elevation in creatinine, no UTI.  No fever.  CT without, redemonstrated right moderate to marked hydronephrosis, double-J stent is in bladder and extends to mid to distal upper ureter but not into kidney. May be source for pain today. Discusesd case in detail with [facility], rec close f/u in office. D/w patient. Pain is well controlled currently, no vomiting, no fever. Stable for out pt management. Discharged home.    After the discussed management above, the patient was determined to be safe for discharge.  The patient was in agreement with this plan and all questions regarding their care were answered.  ED return precautions were discussed and the patient will return to the ED with any significant worsening of condition.   Final Clinical Impression(s) / ED Diagnoses Final diagnoses:  Hydronephrosis, unspecified hydronephrosis type  Right flank pain  Nephrolithiasis    Rx / DC Orders ED Discharge Orders         Ordered    oxyCODONE-acetaminophen (PERCOCET) 5-325 MG tablet  Every 6 hours PRN     01/22/20 1138    naproxen (NAPROSYN) 500 MG tablet  2 times daily PRN     01/22/20 1138           Milagros Loll, MD 01/22/20 1239

## 2020-01-22 NOTE — ED Notes (Signed)
Pt aware urine sample is needed 

## 2020-10-05 ENCOUNTER — Emergency Department (HOSPITAL_COMMUNITY): Payer: Self-pay

## 2020-10-05 ENCOUNTER — Other Ambulatory Visit: Payer: Self-pay

## 2020-10-05 ENCOUNTER — Emergency Department (HOSPITAL_COMMUNITY)
Admission: EM | Admit: 2020-10-05 | Discharge: 2020-10-05 | Disposition: A | Discharge status: Home / Self Care | Payer: Self-pay | Attending: Emergency Medicine | Admitting: Emergency Medicine

## 2020-10-05 ENCOUNTER — Encounter (HOSPITAL_COMMUNITY): Payer: Self-pay

## 2020-10-05 DIAGNOSIS — Z955 Presence of coronary angioplasty implant and graft: Secondary | ICD-10-CM | POA: Insufficient documentation

## 2020-10-05 DIAGNOSIS — Z79899 Other long term (current) drug therapy: Secondary | ICD-10-CM | POA: Insufficient documentation

## 2020-10-05 DIAGNOSIS — R1031 Right lower quadrant pain: Secondary | ICD-10-CM | POA: Insufficient documentation

## 2020-10-05 DIAGNOSIS — K529 Noninfective gastroenteritis and colitis, unspecified: Secondary | ICD-10-CM

## 2020-10-05 DIAGNOSIS — I129 Hypertensive chronic kidney disease with stage 1 through stage 4 chronic kidney disease, or unspecified chronic kidney disease: Secondary | ICD-10-CM | POA: Insufficient documentation

## 2020-10-05 DIAGNOSIS — N189 Chronic kidney disease, unspecified: Secondary | ICD-10-CM | POA: Insufficient documentation

## 2020-10-05 DIAGNOSIS — Z87891 Personal history of nicotine dependence: Secondary | ICD-10-CM | POA: Insufficient documentation

## 2020-10-05 DIAGNOSIS — R110 Nausea: Secondary | ICD-10-CM | POA: Insufficient documentation

## 2020-10-05 DIAGNOSIS — R1033 Periumbilical pain: Secondary | ICD-10-CM | POA: Insufficient documentation

## 2020-10-05 LAB — COMPREHENSIVE METABOLIC PANEL
ALT: 22 U/L (ref 0–44)
AST: 19 U/L (ref 15–41)
Albumin: 4.2 g/dL (ref 3.5–5.0)
Alkaline Phosphatase: 51 U/L (ref 38–126)
Anion gap: 10 (ref 5–15)
BUN: 11 mg/dL (ref 6–20)
CO2: 21 mmol/L — ABNORMAL LOW (ref 22–32)
Calcium: 8.9 mg/dL (ref 8.9–10.3)
Chloride: 107 mmol/L (ref 98–111)
Creatinine, Ser: 0.66 mg/dL (ref 0.44–1.00)
GFR, Estimated: >60 mL/min (ref >60)
Glucose, Bld: 129 mg/dL — ABNORMAL HIGH (ref 70–99)
Potassium: 3.6 mmol/L (ref 3.5–5.1)
Sodium: 138 mmol/L (ref 135–145)
Total Bilirubin: 0.5 mg/dL (ref 0.3–1.2)
Total Protein: 7.1 g/dL (ref 6.5–8.1)

## 2020-10-05 LAB — URINALYSIS, ROUTINE W REFLEX MICROSCOPIC
Bilirubin Urine: NEGATIVE
Glucose, UA: NEGATIVE mg/dL
Ketones, ur: NEGATIVE mg/dL
Leukocytes,Ua: NEGATIVE
Nitrite: NEGATIVE
Protein, ur: NEGATIVE mg/dL
Specific Gravity, Urine: 1.017 (ref 1.005–1.030)
pH: 6 (ref 5.0–8.0)

## 2020-10-05 LAB — CBC
HCT: 40.6 % (ref 36.0–46.0)
Hemoglobin: 14 g/dL (ref 12.0–15.0)
MCH: 31.3 pg (ref 26.0–34.0)
MCHC: 34.5 g/dL (ref 30.0–36.0)
MCV: 90.6 fL (ref 80.0–100.0)
Platelets: 305 10*3/uL (ref 150–400)
RBC: 4.48 MIL/uL (ref 3.87–5.11)
RDW: 12.3 % (ref 11.5–15.5)
WBC: 12.2 10*3/uL — ABNORMAL HIGH (ref 4.0–10.5)
nRBC: 0 % (ref 0.0–0.2)

## 2020-10-05 LAB — I-STAT BETA HCG BLOOD, ED (MC, WL, AP ONLY): I-stat hCG, quantitative: <5 m[IU]/mL (ref <5)

## 2020-10-05 LAB — LIPASE, BLOOD: Lipase: 33 U/L (ref 11–51)

## 2020-10-05 MED ORDER — FENTANYL CITRATE (PF) 100 MCG/2ML IJ SOLN
50.0000 ug | Freq: Once | INTRAMUSCULAR | Status: DC
  Filled 2020-10-05: qty 2

## 2020-10-05 MED ORDER — SODIUM CHLORIDE 0.9 % IV BOLUS: 500.0000 mL | Freq: Once | INTRAVENOUS | Status: DC

## 2020-10-05 MED ORDER — HYOSCYAMINE SULFATE 0.125 MG SL SUBL
0.2500 mg | SUBLINGUAL_TABLET | Freq: Once | SUBLINGUAL | Status: AC
  Administered 2020-10-05: 0.25 mg via SUBLINGUAL
  Filled 2020-10-05: qty 2

## 2020-10-05 MED ORDER — IOHEXOL 300 MG/ML  SOLN
100.0000 mL | Freq: Once | INTRAMUSCULAR | Status: AC | PRN
  Administered 2020-10-05: 100 mL via INTRAVENOUS

## 2020-10-05 MED ORDER — ALUM & MAG HYDROXIDE-SIMETH 200-200-20 MG/5ML PO SUSP
30.0000 mL | Freq: Once | ORAL | Status: AC
  Administered 2020-10-05: 30 mL via ORAL
  Filled 2020-10-05: qty 30

## 2020-10-05 MED ORDER — ONDANSETRON 4 MG PO TBDP: 4.0000 mg | ORAL_TABLET | Freq: Three times a day (TID) | ORAL | 0 refills | Status: AC | PRN

## 2020-10-05 MED ORDER — ONDANSETRON 4 MG PO TBDP
4.0000 mg | ORAL_TABLET | Freq: Once | ORAL | Status: AC
  Administered 2020-10-05: 4 mg via ORAL
  Filled 2020-10-05: qty 1

## 2020-10-05 MED ORDER — HYOSCYAMINE SULFATE SL 0.125 MG SL SUBL: 1.0000 | SUBLINGUAL_TABLET | Freq: Four times a day (QID) | SUBLINGUAL | 0 refills | Status: DC | PRN

## 2020-10-05 MED ORDER — KETOROLAC TROMETHAMINE 60 MG/2ML IM SOLN
30.0000 mg | Freq: Once | INTRAMUSCULAR | Status: AC
  Administered 2020-10-05: 30 mg via INTRAMUSCULAR
  Filled 2020-10-05: qty 2

## 2020-10-05 NOTE — ED Provider Notes (Signed)
[facility]-EMERGENCY DEPT Provider Note  CSN: [id number] Arrival date & time: 10/05/20 0346  Chief Complaint(s) Abdominal Pain  HPI [name] Bass is a 35 y.o. female with a past medical history listed below who presents to the emergency department with several days of right flank pain which is consistent with her prior renal colic however last night she began having generalized abdominal discomfort mostly in the periumbilical region prompting her visit.  No alleviating aggravating factors.  She is endorsing nausea without emesis.  No fevers or chills.  No coughing or congestion.  She denies any urinary symptoms.  No vaginal discharge or bleeding.  HPI  Past Medical History Past Medical History:  Diagnosis Date  . Abnormal Pap smear   . Bacterial vaginosis 04/29/04  . Chronic kidney disease    kidney stones  . CIN I (cervical intraepithelial neoplasia I)   . Dyspareunia 05/02/10  . Dysuria 04/04/05  . FHx: suicide   . H/O amenorrhea 02/19/04  . Herpes simplex type 2 infection    "last outbreak 3 weeks ago"  . Herpes simplex without mention of complication   . 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 History of kidney stones   . Hx: UTI (urinary tract infection)   . Hypertension   . Infection    UTI  . Irregular periods/menstrual cycles   . Polycystic ovarian syndrome 05/08/05  . Vaginal Pap smear, abnormal   . Vaginitis and vulvovaginitis 04/29/04   Patient Active Problem List   Diagnosis Date Noted  . Nephrolithiasis 01/17/2020  . Constipation 03/04/2015  . Enteritis 03/03/2015  . Hypokalemia 03/03/2015  . Ileus (HCC) 03/03/2015  . Abdominal pain 03/03/2015  . Headache 03/03/2015  . Vaginal delivery 08/04/2012  . Bilateral kidney stones 07/08/2012  . Abnormal glucose tolerance test in pregnancy, antepartum 07/08/2012  . Unsure of LMP  07/08/2012  . Normal pregnancy 01/17/2012  . Abnormal Pap smear   . Polycystic ovarian syndrome   . CIN I (cervical  intraepithelial neoplasia I)   . Herpes simplex without mention of complication   . FHx: suicide   . HSV (herpes simplex virus) anogenital infection 12/02/2011  . High risk HPV infection 11/28/2011  . History of PCOS 11/28/2011  . Cervical intraepithelial neoplasia I 11/28/2011  . Lower abdominal pain 02/28/2011    Class: Acute   Home Medication(s) Prior to Admission medications   Medication Sig Start Date End Date Taking? Authorizing Provider  acetaminophen (TYLENOL) 500 MG tablet Take 1,500 mg by mouth every 6 (six) hours as needed for mild pain, moderate pain or headache.     [provider]  Cholecalciferol (VITAMIN D3) 50 MCG (2000 UT) TABS Take 2,000 Units by mouth every other day.    [provider]  ciprofloxacin (CIPRO) 500 MG tablet Take 500 mg by mouth 2 (two) times daily. Pt takes with food    [provider]  metroNIDAZOLE (FLAGYL) 500 MG tablet Take 500 mg by mouth 2 (two) times daily. Pt takes with food 01/09/20   [provider]  Multiple Vitamins-Minerals (MULTIVITAMIN WITH MINERALS) tablet Take 1 tablet by mouth daily.    [provider]  naproxen (NAPROSYN) 500 MG tablet Take 1 tablet (500 mg total) by mouth 2 (two) times daily as needed for moderate pain. 01/22/20   Milagros Loll, MD  NIFEdipine (PROCARDIA-XL/NIFEDICAL-XL) 30 MG 24 hr tablet Take 30 mg by mouth every evening. Pt takes in the pm 12/23/19   [provider]  ondansetron (ZOFRAN) 4 MG tablet Take 1 tablet (4 mg total) by mouth every 8 (eight) hours as needed for nausea or vomiting. 01/17/20 01/16/21  McKenzie, Mardene Celeste, MD  phenazopyridine (PYRIDIUM) 100 MG tablet Take 1 tablet (100 mg total) by mouth 3 (three) times daily as needed for pain. 01/17/20   McKenzie, Mardene Celeste, MD  tamsulosin (FLOMAX) 0.4 MG CAPS capsule Take 1 capsule (0.4 mg total) by mouth daily. Patient taking differently: Take 0.4 mg by mouth at bedtime.  01/19/20   Sabas Sous, MD                                                                                                                                     Past Surgical History Past Surgical History:  Procedure Laterality Date  . CHOLECYSTECTOMY, LAPAROSCOPIC  2010  . CYSTOSCOPY WITH RETROGRADE PYELOGRAM, URETEROSCOPY AND STENT PLACEMENT Bilateral 01/17/2020   Procedure: CYSTOSCOPY WITH RETROGRADE PYELOGRAM, URETEROSCOPY bilateral right double j stent placement;  Surgeon: Malen Gauze, MD;  Location: [facility];  Service: Urology;  Laterality: Bilateral;  1 HR  . CYSTOSCOPY WITH URETEROSCOPY  03/28/2012   Procedure: CYSTOSCOPY WITH URETEROSCOPY;  Surgeon: Marcine Matar, MD;  Location: [facility];  Service: Urology;  Laterality: Right;  RIGHT URETEROSCOPIC STONE EXTRACTION W/ LASER (Pt is [redacted] wk pregnant)    . EXTRACORPOREAL SHOCK WAVE LITHOTRIPSY Left 11/09/2016   Procedure: EXTRACORPOREAL SHOCK WAVE LITHOTRIPSY (ESWL);  Surgeon: Crist Fat, MD;  Location: [facility];  Service: Urology;  Laterality: Left;  . HOLMIUM LASER APPLICATION Bilateral 01/17/2020   Procedure: HOLMIUM LASER APPLICATION;  Surgeon: Malen Gauze, MD;  Location: [facility];  Service: Urology;  Laterality: Bilateral;  . STONE EXTRACTION WITH BASKET  03/28/2012   Procedure: STONE EXTRACTION WITH BASKET;  Surgeon: Marcine Matar, MD;  Location: [facility];  Service: Urology;  Laterality: Right;  . TONSILLECTOMY  2006  . TUBAL LIGATION  08/04/2012   Procedure: POST PARTUM TUBAL LIGATION;  Surgeon: Hal Morales, MD;  Location: [facility];  Service: Gynecology;  Laterality: Bilateral;  Bilateral post partum tubal ligation  . WISDOM TOOTH EXTRACTION  2001   Family History Family History  Problem Relation Age of Onset  . Heart disease Father        heart attack in oct.  lm  . Cancer Maternal Uncle        bone cancer  . Diabetes Maternal Uncle   . Cancer Maternal Grandmother        ovarian cancer  . Suicidality Mother   . Diabetes Maternal Aunt   .  Cancer Maternal Aunt        lung  . Hypertension Paternal Uncle     Social History Social History   Tobacco Use  . Smoking status: Former Games developer  . Smokeless tobacco: Never Used  . Tobacco comment: Jan 2015  Vaping Use  . Vaping Use: Never used  Substance Use Topics  . Alcohol use: Yes    Alcohol/week: 0.0 standard drinks    Comment: occasional  . Drug use: No   Allergies Amoxicillin and Eggs or egg-derived products  Review of Systems Review of Systems All other systems are reviewed and are negative for acute change except as noted in the HPI  Physical Exam Vital Signs  I have reviewed the triage vital signs BP (!) 155/92 (BP Location: Left Arm)   Pulse 80   Temp 97.8 F (36.6 C) (Oral)   Resp 19   LMP 09/04/2020 Comment: negative beta HCG 10/05/20  SpO2 100%   Physical Exam Vitals reviewed.  Constitutional:      General: She is not in acute distress.    Appearance: She is well-developed and well-nourished. She is not diaphoretic.  HENT:     Head: Normocephalic and atraumatic.     Right Ear: External ear normal.     Left Ear: External ear normal.     Nose: Nose normal.  Eyes:     General: No scleral icterus.    Extraocular Movements: EOM normal.     Conjunctiva/sclera: Conjunctivae normal.  Neck:     Trachea: Phonation normal.  Cardiovascular:     Rate and Rhythm: Normal rate and regular rhythm.  Pulmonary:     Effort: Pulmonary effort is normal. No respiratory distress.     Breath sounds: No stridor.  Abdominal:     General: There is no distension.     Tenderness: There is abdominal tenderness in the right lower quadrant and periumbilical area. There is right CVA tenderness.  Musculoskeletal:        General: No edema. Normal range of motion.     Cervical back: Normal range of motion.  Neurological:     Mental Status: She is alert and oriented to person, place, and time.  Psychiatric:        Mood and Affect: Mood and affect normal.        Behavior:  Behavior normal.     ED Results and Treatments Labs (all labs ordered are listed, but only abnormal results are displayed) Labs Reviewed  COMPREHENSIVE METABOLIC PANEL - Abnormal; Notable for the following components:      Result Value   CO2 21 (*)    Glucose, Bld 129 (*)    All other components within normal limits  CBC - Abnormal; Notable for the following components:   WBC 12.2 (*)    All other components within normal limits  URINALYSIS, ROUTINE W REFLEX MICROSCOPIC - Abnormal; Notable for the following components:   Hgb urine dipstick SMALL (*)    Bacteria, UA FEW (*)    All other components within normal limits  LIPASE, BLOOD  I-STAT BETA HCG BLOOD, ED ([facility], [facility], [facility] ONLY)  EKG  EKG Interpretation  Date/Time:    Ventricular Rate:    PR Interval:    QRS Duration:   QT Interval:    QTC Calculation:   R Axis:     Text Interpretation:        Radiology No results found.  Pertinent labs & imaging results that were available during my care of the patient were reviewed by me and considered in my medical decision making (see chart for details).  Medications Ordered in ED Medications  sodium chloride 0.9 % bolus 500 mL (has no administration in time range)  fentaNYL (SUBLIMAZE) injection 50 mcg (has no administration in time range)  ketorolac (TORADOL) injection 30 mg (30 mg Intramuscular Given 10/05/20 0432)  ondansetron (ZOFRAN-ODT) disintegrating tablet 4 mg (4 mg Oral Given 10/05/20 0432)  iohexol (OMNIPAQUE) 300 MG/ML solution 100 mL (100 mLs Intravenous Contrast Given 10/05/20 0641)                                                                                                                                    Procedures Procedures  (including critical care time)  Medical Decision Making / ED Course I have reviewed the nursing notes for this  encounter and the patient's prior records (if available in EHR or on provided paperwork).   Myangel Summons was evaluated in Emergency Department on 10/05/2020 for the symptoms described in the history of present illness. She was evaluated in the context of the global COVID-19 pandemic, which necessitated consideration that the patient might be at risk for infection with the SARS-CoV-2 virus that causes COVID-19. Institutional protocols and algorithms that pertain to the evaluation of patients at risk for COVID-19 are in a state of rapid change based on information released by regulatory bodies including the CDC and federal and state organizations. These policies and algorithms were followed during the patient's care in the ED.  Initially suspicious for renal colic. Patient provided with IM Toradol which she reported provided some relief. Screening labs notable for leukocytosis. No significant electrolyte derangements or renal insufficiency.  No evidence of bili obstruction or pancreatitis. UA with small amount of blood but no infection.  On reassessment patient reports that her pain is improved however she is having right lower quadrant tenderness.  Given the leukocytosis and right lower quadrant tenderness, will obtain a CT scan to rule out appendicitis.  CT notable for possible enteritis. Tolerating PO intake.      Final Clinical Impression(s) / ED Diagnoses Final diagnoses:  Right lower quadrant abdominal pain    The patient appears reasonably screened and/or stabilized for discharge and I doubt any other medical condition or other [facility] requiring further screening, evaluation, or treatment in the ED at this time prior to discharge. Safe for discharge with strict return precautions.  Disposition: Discharge  Condition: Good  I have discussed the results, Dx and Tx plan with the patient/family who expressed understanding and agree(s)  with the plan. Discharge instructions discussed at  length. The patient/family was given strict return precautions who verbalized understanding of the instructions. No further questions at time of discharge.    ED Discharge Orders         Ordered    Hyoscyamine Sulfate SL (LEVSIN/SL) 0.125 MG SUBL  4 times daily PRN        10/05/20 0733    ondansetron (ZOFRAN ODT) 4 MG disintegrating tablet  Every 8 hours PRN        10/05/20 [id number]           Follow Up: Primary care provider  Schedule an appointment as soon as possible for a visit  if you do not have a primary care physician, contact [facility] at [phone] for referral     This chart was dictated using voice recognition software.  Despite best efforts to proofread,  errors can occur which can change the documentation meaning.   Nira Connardama, Jonia Oakey Eduardo, MD 10/05/20 [phone]

## 2020-10-05 NOTE — ED Triage Notes (Signed)
Patient arrived with complaints of generalized abdominal pain and nausea that started tonight. Also reports mid-right sided back pain over the last few days.

## 2020-11-01 ENCOUNTER — Encounter: Payer: Self-pay | Admitting: Urology

## 2021-07-07 ENCOUNTER — Other Ambulatory Visit: Payer: Self-pay

## 2021-07-07 ENCOUNTER — Encounter (HOSPITAL_BASED_OUTPATIENT_CLINIC_OR_DEPARTMENT_OTHER): Payer: Self-pay

## 2021-07-07 ENCOUNTER — Emergency Department (HOSPITAL_BASED_OUTPATIENT_CLINIC_OR_DEPARTMENT_OTHER)
Admission: EM | Admit: 2021-07-07 | Discharge: 2021-07-07 | Disposition: A | Discharge status: Home / Self Care | Payer: Self-pay | Attending: Emergency Medicine | Admitting: Emergency Medicine

## 2021-07-07 DIAGNOSIS — I129 Hypertensive chronic kidney disease with stage 1 through stage 4 chronic kidney disease, or unspecified chronic kidney disease: Secondary | ICD-10-CM | POA: Insufficient documentation

## 2021-07-07 DIAGNOSIS — L739 Follicular disorder, unspecified: Secondary | ICD-10-CM | POA: Insufficient documentation

## 2021-07-07 DIAGNOSIS — N189 Chronic kidney disease, unspecified: Secondary | ICD-10-CM | POA: Insufficient documentation

## 2021-07-07 DIAGNOSIS — Z87891 Personal history of nicotine dependence: Secondary | ICD-10-CM | POA: Insufficient documentation

## 2021-07-07 MED ORDER — DOXYCYCLINE HYCLATE 100 MG PO CAPS: 100.0000 mg | ORAL_CAPSULE | Freq: Two times a day (BID) | ORAL | 0 refills | Status: AC

## 2021-07-07 NOTE — Discharge Instructions (Signed)
You are diagnosed with an inflamed or infected hair gland on your scalp, which is called folliculitis.  You can apply warm hot compresses 3 times a day onto this area to see if this helps with the swelling.  These will often go away after several days on their own.  If the abscess is getting worse, or you begin having fevers, I gave you a prescription for an antibiotic called doxycycline which you can take for 7 days.

## 2021-07-07 NOTE — ED Notes (Signed)
Has bump to right back of head raised area about size of a quarter.  No marks on scalp noted.  Denies injury or fever.

## 2021-07-07 NOTE — ED Triage Notes (Signed)
Patient here POV from Home with Swelling noted to Posterior Head.  Swelling is localized to Posterior Head and Patietn states it has been present for approximately 3-4 days but has worsened in Pain and Size since.  NAD Noted during Triage. A&Ox4. GCS 15. Ambulatory. No Fevers.

## 2021-07-07 NOTE — ED Provider Notes (Signed)
[facility] EMERGENCY DEPT Provider Note   CSN: [id number] Arrival date & time: 07/07/21  1623     History Chief Complaint  Patient presents with   Head Swelling    [name] Bass is a 35 y.o. female presenting to the emergency department with a lesion on the top of her head.  She says she noticed several days ago a small painful bump near the crown of her head.  She is never had this before.  It is about the size of a pea and tender to the touch.  She denies any injuries to her head.  She denies any neurological deficits.  She denies any history of MRSA or abscess.  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   History of kidney stones    Hx: UTI (urinary tract infection)    Hypertension    Infection    UTI   Irregular periods/menstrual cycles    Polycystic ovarian syndrome 05/08/05   Vaginal Pap smear, abnormal    Vaginitis and vulvovaginitis 04/29/04    Patient Active Problem List   Diagnosis Date Noted   Nephrolithiasis 01/17/2020   Constipation 03/04/2015   Enteritis 03/03/2015   Hypokalemia 03/03/2015   Ileus (HCC) 03/03/2015   Abdominal pain 03/03/2015   Headache 03/03/2015   Vaginal delivery 08/04/2012   Bilateral kidney stones 07/08/2012   Abnormal glucose tolerance test in pregnancy, antepartum 07/08/2012   Unsure of LMP  07/08/2012   Normal pregnancy 01/17/2012   Abnormal Pap smear    Polycystic ovarian syndrome    CIN I (cervical intraepithelial neoplasia I)    Herpes simplex without mention of complication    FHx: suicide    HSV (herpes simplex virus)  anogenital infection 12/02/2011   High risk HPV infection 11/28/2011   History of PCOS 11/28/2011   Cervical intraepithelial neoplasia I 11/28/2011   Lower abdominal pain 02/28/2011    Class: Acute    Past Surgical History:  Procedure Laterality Date   CHOLECYSTECTOMY, LAPAROSCOPIC  2010   CYSTOSCOPY WITH RETROGRADE PYELOGRAM, URETEROSCOPY AND STENT PLACEMENT Bilateral 01/17/2020   Procedure: CYSTOSCOPY WITH RETROGRADE PYELOGRAM, URETEROSCOPY bilateral right double j stent placement;  Surgeon: Malen Gauze, MD;  Location: [facility];  Service: Urology;  Laterality: Bilateral;  1 HR   CYSTOSCOPY WITH URETEROSCOPY  03/28/2012   Procedure: CYSTOSCOPY WITH URETEROSCOPY;  Surgeon: Marcine Matar, MD;  Location: [facility];  Service: Urology;  Laterality: Right;  RIGHT URETEROSCOPIC STONE EXTRACTION W/ LASER (Pt is [redacted] wk pregnant)     EXTRACORPOREAL SHOCK WAVE LITHOTRIPSY Left 11/09/2016   Procedure: EXTRACORPOREAL SHOCK WAVE LITHOTRIPSY (ESWL);  Surgeon: Crist Fat, MD;  Location: [facility];  Service: Urology;  Laterality: Left;   HOLMIUM LASER APPLICATION Bilateral 01/17/2020   Procedure: HOLMIUM LASER APPLICATION;  Surgeon: Malen Gauze, MD;  Location: [facility];  Service: Urology;  Laterality: Bilateral;   STONE EXTRACTION WITH BASKET  03/28/2012   Procedure: STONE EXTRACTION WITH BASKET;  Surgeon: Marcine Matar, MD;  Location: [facility];  Service: Urology;  Laterality: Right;   TONSILLECTOMY  2006   TUBAL LIGATION  08/04/2012   Procedure: POST PARTUM TUBAL LIGATION;  Surgeon: Hal Morales, MD;  Location: [facility];  Service: Gynecology;  Laterality: Bilateral;  Bilateral post partum tubal ligation   WISDOM TOOTH EXTRACTION  2001     OB History     Gravida  3   Para  3   Term  3   Preterm      AB      Living  3      SAB      IAB      Ectopic      Multiple      Live Births  3           Family History  Problem Relation Age of Onset   Heart disease  Father        heart attack in 72.  lm   Cancer Maternal Uncle        bone cancer   Diabetes Maternal Uncle    Cancer Maternal Grandmother        ovarian cancer   Suicidality Mother    Diabetes Maternal Aunt    Cancer Maternal Aunt        lung   Hypertension Paternal Uncle     Social History   Tobacco Use   Smoking status: Former   Smokeless tobacco: Never   Tobacco comments:    Jan 2015  Vaping Use   Vaping Use: Never used  Substance Use Topics   Alcohol use: Yes    Alcohol/week: 0.0 standard drinks    Comment: occasional   Drug use: No    Home Medications Prior to Admission medications   Medication Sig Start Date End Date Taking? Authorizing Provider  doxycycline (VIBRAMYCIN) 100 MG capsule Take 1 capsule (100 mg total) by mouth 2 (two) times daily for 7 doses. 07/07/21 07/10/21 Yes Georganna Maxson, Kermit Balo, MD  acetaminophen (TYLENOL) 500 MG tablet Take 1,500 mg by mouth every 6 (six) hours as needed for mild pain, moderate pain or headache.     [provider]  Cholecalciferol (VITAMIN D3) 50 MCG (2000 UT) TABS Take 2,000 Units by mouth every other day.    [provider]  ciprofloxacin (CIPRO) 500 MG tablet Take 500 mg by mouth 2 (two) times daily. Pt takes with food    [provider]  Hyoscyamine Sulfate SL (LEVSIN/SL) 0.125 MG SUBL Place 1 each under the tongue 4 (four) times daily as needed for up to 5 days. 10/05/20 10/10/20  Nira Conn, MD  metroNIDAZOLE (FLAGYL) 500 MG tablet Take 500 mg by mouth 2 (two) times daily. Pt takes with food 01/09/20   [provider]  Multiple Vitamins-Minerals (MULTIVITAMIN WITH MINERALS) tablet Take 1 tablet by mouth daily.    [provider]  naproxen (NAPROSYN) 500 MG tablet Take 1 tablet (500 mg total) by mouth 2 (two) times daily as needed for moderate pain. 01/22/20   Milagros Loll, MD  NIFEdipine (PROCARDIA-XL/NIFEDICAL-XL) 30 MG 24 hr tablet Take 30 mg by mouth every evening.  Pt takes in the pm 12/23/19   [provider]  phenazopyridine (PYRIDIUM) 100 MG tablet Take 1 tablet (100 mg total) by mouth 3 (three) times daily as needed for pain. 01/17/20   McKenzie, Mardene Celeste, MD  tamsulosin (FLOMAX) 0.4 MG CAPS capsule Take 1 capsule (0.4 mg total) by mouth daily. Patient taking differently: Take 0.4 mg  by mouth at bedtime.  01/19/20   Sabas Sous, MD    Allergies    Amoxicillin and Eggs or egg-derived products  Review of Systems   Review of Systems  Constitutional:  Negative for chills and fever.  Respiratory:  Negative for cough and shortness of breath.   Cardiovascular:  Negative for chest pain and palpitations.  Gastrointestinal:  Negative for abdominal pain and vomiting.  Skin:  Negative for rash and wound.  Neurological:  Negative for syncope, light-headedness and headaches.  All other systems reviewed and are negative.  Physical Exam Updated Vital Signs BP (!) 151/99 (BP Location: Right Arm)   Pulse 83   Temp 98.3 F (36.8 C) (Oral)   Resp 16   Ht 5\' 10"  (1.778 m)   Wt 86.2 kg   SpO2 99%   BMI 27.26 kg/m   Physical Exam Constitutional:      General: She is not in acute distress. HENT:     Head: Normocephalic and atraumatic.     Comments: Small 0.5 cm region of induration at the occipital crown Eyes:     Conjunctiva/sclera: Conjunctivae normal.     Pupils: Pupils are equal, round, and reactive to light.  Cardiovascular:     Rate and Rhythm: Normal rate and regular rhythm.  Skin:    General: Skin is warm and dry.  Neurological:     Mental Status: She is alert and oriented to person, place, and time. Mental status is at baseline.  Psychiatric:        Mood and Affect: Mood normal.        Behavior: Behavior normal.    ED Results / Procedures / Treatments   Labs (all labs ordered are listed, but only abnormal results are displayed) Labs Reviewed - No data to display  EKG None  Radiology No results  found.  Procedures Procedures   Medications Ordered in ED Medications - No data to display  ED Course  I have reviewed the triage vital signs and the nursing notes.  Pertinent labs & imaging results that were available during my care of the patient were reviewed by me and considered in my medical decision making (see chart for details).  Patient is here with suspected folliculitis versus sebaceous cyst.  No evidence of enlarging abscess or intracranial injury.  Doubt meningitis.  I discussed warm compresses on the head.  I will offer her a watch and wait prescription for doxycycline, although I explained at this time do not see clear evidence of cellulitis and I think it would be completely reasonable for her to treat this conservatively without antibiotics for several days.  She would prefer the prescription and verbalizes understanding.  Okay for discharge    Final Clinical Impression(s) / ED Diagnoses Final diagnoses:  Folliculitis    Rx / DC Orders ED Discharge Orders          Ordered    doxycycline (VIBRAMYCIN) 100 MG capsule  2 times daily        07/07/21 1734             Adysson Revelle, 07/09/21, MD 07/08/21 [phone]

## 2021-10-05 ENCOUNTER — Encounter (HOSPITAL_COMMUNITY): Payer: Self-pay

## 2021-10-05 ENCOUNTER — Emergency Department (HOSPITAL_COMMUNITY): Payer: Self-pay

## 2021-10-05 ENCOUNTER — Emergency Department (HOSPITAL_COMMUNITY)
Admission: EM | Admit: 2021-10-05 | Discharge: 2021-10-05 | Disposition: A | Discharge status: Home / Self Care | Payer: Self-pay | Attending: Emergency Medicine | Admitting: Emergency Medicine

## 2021-10-05 DIAGNOSIS — N201 Calculus of ureter: Secondary | ICD-10-CM

## 2021-10-05 DIAGNOSIS — N189 Chronic kidney disease, unspecified: Secondary | ICD-10-CM | POA: Insufficient documentation

## 2021-10-05 DIAGNOSIS — I129 Hypertensive chronic kidney disease with stage 1 through stage 4 chronic kidney disease, or unspecified chronic kidney disease: Secondary | ICD-10-CM | POA: Insufficient documentation

## 2021-10-05 DIAGNOSIS — N132 Hydronephrosis with renal and ureteral calculous obstruction: Secondary | ICD-10-CM | POA: Insufficient documentation

## 2021-10-05 LAB — CBC WITH DIFFERENTIAL/PLATELET
Abs Immature Granulocytes: 0.06 10*3/uL (ref 0.00–0.07)
Basophils Absolute: 0.1 10*3/uL (ref 0.0–0.1)
Basophils Relative: 1 %
Eosinophils Absolute: 0.4 10*3/uL (ref 0.0–0.5)
Eosinophils Relative: 2 %
HCT: 43.7 % (ref 36.0–46.0)
Hemoglobin: 15 g/dL (ref 12.0–15.0)
Immature Granulocytes: 0 %
Lymphocytes Relative: 21 %
Lymphs Abs: 3.3 10*3/uL (ref 0.7–4.0)
MCH: 31.2 pg (ref 26.0–34.0)
MCHC: 34.3 g/dL (ref 30.0–36.0)
MCV: 90.9 fL (ref 80.0–100.0)
Monocytes Absolute: 0.9 10*3/uL (ref 0.1–1.0)
Monocytes Relative: 6 %
Neutro Abs: 11.2 10*3/uL — ABNORMAL HIGH (ref 1.7–7.7)
Neutrophils Relative %: 70 %
Platelets: 350 10*3/uL (ref 150–400)
RBC: 4.81 MIL/uL (ref 3.87–5.11)
RDW: 12 % (ref 11.5–15.5)
WBC: 16 10*3/uL — ABNORMAL HIGH (ref 4.0–10.5)
nRBC: 0 % (ref 0.0–0.2)

## 2021-10-05 LAB — URINALYSIS, ROUTINE W REFLEX MICROSCOPIC
Bilirubin Urine: NEGATIVE
Glucose, UA: NEGATIVE mg/dL
Ketones, ur: NEGATIVE mg/dL
Leukocytes,Ua: NEGATIVE
Nitrite: NEGATIVE
Protein, ur: NEGATIVE mg/dL
Specific Gravity, Urine: 1.011 (ref 1.005–1.030)
pH: 7 (ref 5.0–8.0)

## 2021-10-05 LAB — LIPASE, BLOOD: Lipase: 31 U/L (ref 11–51)

## 2021-10-05 LAB — COMPREHENSIVE METABOLIC PANEL
ALT: 31 U/L (ref 0–44)
AST: 26 U/L (ref 15–41)
Albumin: 4.3 g/dL (ref 3.5–5.0)
Alkaline Phosphatase: 60 U/L (ref 38–126)
Anion gap: 9 (ref 5–15)
BUN: 11 mg/dL (ref 6–20)
CO2: 20 mmol/L — ABNORMAL LOW (ref 22–32)
Calcium: 9.2 mg/dL (ref 8.9–10.3)
Chloride: 107 mmol/L (ref 98–111)
Creatinine, Ser: 0.81 mg/dL (ref 0.44–1.00)
GFR, Estimated: >60 mL/min (ref >60)
Glucose, Bld: 114 mg/dL — ABNORMAL HIGH (ref 70–99)
Potassium: 3.7 mmol/L (ref 3.5–5.1)
Sodium: 136 mmol/L (ref 135–145)
Total Bilirubin: 0.7 mg/dL (ref 0.3–1.2)
Total Protein: 7.4 g/dL (ref 6.5–8.1)

## 2021-10-05 LAB — I-STAT BETA HCG BLOOD, ED (MC, WL, AP ONLY): I-stat hCG, quantitative: <5 m[IU]/mL (ref <5)

## 2021-10-05 MED ORDER — SENNOSIDES-DOCUSATE SODIUM 8.6-50 MG PO TABS: 1.0000 | ORAL_TABLET | Freq: Every evening | ORAL | 0 refills | Status: DC | PRN

## 2021-10-05 MED ORDER — OXYCODONE-ACETAMINOPHEN 5-325 MG PO TABS: 1.0000 | ORAL_TABLET | Freq: Four times a day (QID) | ORAL | 0 refills | Status: DC | PRN

## 2021-10-05 MED ORDER — OXYCODONE-ACETAMINOPHEN 5-325 MG PO TABS
1.0000 | ORAL_TABLET | Freq: Once | ORAL | Status: AC
  Administered 2021-10-05: 1 via ORAL
  Filled 2021-10-05: qty 1

## 2021-10-05 MED ORDER — IBUPROFEN 800 MG PO TABS: 800.0000 mg | ORAL_TABLET | Freq: Four times a day (QID) | ORAL | 0 refills | Status: DC | PRN

## 2021-10-05 MED ORDER — ONDANSETRON HCL 4 MG/2ML IJ SOLN
4.0000 mg | Freq: Once | INTRAMUSCULAR | Status: AC
Start: 2021-10-05 — End: 2021-10-05
  Administered 2021-10-05: 4 mg via INTRAVENOUS
  Filled 2021-10-05: qty 2

## 2021-10-05 MED ORDER — ONDANSETRON HCL 4 MG PO TABS: 4.0000 mg | ORAL_TABLET | Freq: Four times a day (QID) | ORAL | 0 refills | Status: AC

## 2021-10-05 MED ORDER — TAMSULOSIN HCL 0.4 MG PO CAPS: 0.4000 mg | ORAL_CAPSULE | Freq: Every day | ORAL | 0 refills | Status: AC

## 2021-10-05 MED ORDER — HYDROMORPHONE HCL 1 MG/ML IJ SOLN
1.0000 mg | Freq: Once | INTRAMUSCULAR | Status: AC
  Administered 2021-10-05: 1 mg via INTRAVENOUS
  Filled 2021-10-05: qty 1

## 2021-10-05 MED ORDER — HYDROMORPHONE HCL 1 MG/ML IJ SOLN
1.0000 mg | Freq: Once | INTRAMUSCULAR | Status: AC
Start: 2021-10-05 — End: 2021-10-05
  Administered 2021-10-05: 1 mg via INTRAVENOUS
  Filled 2021-10-05: qty 1

## 2021-10-05 MED ORDER — SODIUM CHLORIDE 0.9 % IV BOLUS
500.0000 mL | Freq: Once | INTRAVENOUS | Status: AC
  Administered 2021-10-05: 500 mL via INTRAVENOUS

## 2021-10-05 NOTE — ED Provider Notes (Signed)
Emergency Department Provider Note   I have reviewed the triage vital signs and the nursing notes.   HISTORY  Chief Complaint Flank Pain   HPI [name] Bass is a 36 y.o. female with past medical history reviewed below including prior ureteral stone requiring stenting presents to the emergency department with left flank pain.  She has had more mild to moderate intermittent symptoms over the past week but in the past 24 hours symptoms become severe and unbearable.  She denies any fevers.  She is having some urine hesitancy.  No hematuria.  No pain into the chest or shortness of breath. Notes this feels similar to prior stones.    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 ENT: No sore throat. Cardiovascular: Denies chest pain. Respiratory: Denies shortness of breath. Gastrointestinal: Positive left flank/abdominal pain.  Mild nausea, no vomiting.  No diarrhea.  No constipation. Genitourinary: Negative for dysuria. Musculoskeletal: Negative for back pain. Skin: Negative for rash. Neurological: Negative for headaches, focal weakness or numbness.   ____________________________________________   PHYSICAL EXAM:  VITAL SIGNS: ED Triage Vitals [10/05/21 0829]  Enc Vitals Group      BP (!) 149/96     Pulse Rate 88     Resp 16     Temp 98.5 F (36.9 C)     Temp Source Oral     SpO2 99 %   Constitutional: Alert and oriented. Appears uncomfortable but able to provide a full history.  Eyes: Conjunctivae are normal.  Head: Atraumatic. Nose: No congestion/rhinnorhea. Mouth/Throat: Mucous membranes are moist.  Neck: No stridor. Cardiovascular: Normal rate, regular rhythm. Good peripheral circulation. Grossly normal heart sounds.   Respiratory: Normal respiratory effort.  No retractions. Lungs CTAB. Gastrointestinal: Soft and nontender. No distention.  Musculoskeletal: No lower extremity tenderness nor edema. No gross deformities of extremities. Neurologic:  Normal speech and language. No gross focal neurologic deficits are appreciated.  Skin:  Skin is warm, dry and intact. No rash noted.  ____________________________________________   LABS (all labs ordered are listed, but only abnormal results are displayed)  Labs Reviewed  COMPREHENSIVE METABOLIC PANEL - Abnormal; Notable for the following components:      Result Value   CO2 20 (*)    Glucose, Bld 114 (*)    All other components within normal limits  CBC WITH DIFFERENTIAL/PLATELET - Abnormal; Notable for the following components:   WBC 16.0 (*)    Neutro Abs 11.2 (*)    All other components within normal limits  URINALYSIS, ROUTINE W REFLEX MICROSCOPIC - Abnormal; Notable for the following components:   APPearance CLOUDY (*)    Hgb urine dipstick MODERATE (*)  Bacteria, UA RARE (*)    All other components within normal limits  URINE CULTURE  LIPASE, BLOOD  I-STAT BETA HCG BLOOD, ED ([facility], [facility], [facility] ONLY)   ____________________________________________  RADIOLOGY  CT Renal Stone Study  Result Date: 10/05/2021 CLINICAL DATA:  Acute left flank pain. EXAM: CT ABDOMEN AND PELVIS WITHOUT CONTRAST TECHNIQUE: Multidetector CT imaging of the abdomen and pelvis was performed following the standard protocol  without IV contrast. RADIATION DOSE REDUCTION: This exam was performed according to the departmental dose-optimization program which includes automated exposure control, adjustment of the mA and/or kV according to patient size and/or use of iterative reconstruction technique. COMPARISON:  June 15, 2021. FINDINGS: Lower chest: No acute abnormality. Hepatobiliary: No focal liver abnormality is seen. Status post cholecystectomy. No biliary dilatation. Pancreas: Unremarkable. No pancreatic ductal dilatation or surrounding inflammatory changes. Spleen: Normal in size without focal abnormality. Adrenals/Urinary Tract: Adrenal glands appear normal. Bilateral nephrolithiasis is noted. Moderate left hydronephrosis is noted with perinephric stranding secondary to 9 mm calculus at the left ureteropelvic junction. Urinary bladder is unremarkable. Stomach/Bowel: Stomach is within normal limits. Appendix appears normal. No evidence of bowel wall thickening, distention, or inflammatory changes. Vascular/Lymphatic: No significant vascular findings are present. No enlarged abdominal or pelvic lymph nodes. Reproductive: Uterus and bilateral adnexa are unremarkable. Other: No abdominal wall hernia or abnormality. No abdominopelvic ascites. Musculoskeletal: No acute or significant osseous findings. IMPRESSION: Moderate left hydronephrosis with perinephric stranding is noted secondary to 9 mm calculus at the left ureteropelvic junction. Bilateral nephrolithiasis is noted. Electronically Signed   By: Marijo Conception M.D.   On: 10/05/2021 11:17    ____________________________________________   PROCEDURES  Procedure(s) performed:   Procedures  None  ____________________________________________   INITIAL IMPRESSION / ASSESSMENT AND PLAN / ED COURSE  Pertinent labs & imaging results that were available during my care of the patient were reviewed by me and considered in my medical decision making (see chart for details).    This patient is Presenting for Evaluation of flank pain, which does require a range of treatment options, and is a complaint that involves a high risk of morbidity and mortality.  The Differential Diagnoses includes but is not exclusive to ectopic pregnancy, ovarian cyst, ovarian torsion, acute appendicitis, urinary tract infection, endometriosis, bowel obstruction, hernia, colitis, renal colic, gastroenteritis, volvulus etc.   Critical Interventions- IV pain and nausea medication.   Medications  oxyCODONE-acetaminophen (PERCOCET/ROXICET) 5-325 MG per tablet 1 tablet (has no administration in time range)  oxyCODONE-acetaminophen (PERCOCET/ROXICET) 5-325 MG per tablet 1 tablet (1 tablet Oral Given 10/05/21 0858)  HYDROmorphone (DILAUDID) injection 1 mg (1 mg Intravenous Given 10/05/21 0955)  ondansetron (ZOFRAN) injection 4 mg (4 mg Intravenous Given 10/05/21 0955)  sodium chloride 0.9 % bolus 500 mL (0 mLs Intravenous Stopped 10/05/21 1133)  HYDROmorphone (DILAUDID) injection 1 mg (1 mg Intravenous Given 10/05/21 1133)    Reassessment after intervention: Pain improved.    I decided to review pertinent External Data, and in summary last Urologic intervention was stent placement in 2021.   Clinical Laboratory Tests Ordered, included mild leukocytosis to 16.  No anemia.  Lipase normal.  Kidney function normal.  UA without sign of infection. Preg negative.   Radiologic Tests Ordered, included CT renal. I independently interpreted the images and agree with radiology interpretation.   Social Determinants of Health Risk patient is a prior smoker.   Medical Decision Making: Summary:  Patient presents to the emergency department with left flank pain similar to prior  kidney stone pain.  She has required stenting in the past.  She appears uncomfortable but vital signs are reassuring.  She is afebrile.   CT renal showing 9 mm stone at the left UVJ.  Hydro noted on scan.  No evidence of infection on  UA.  There is moderate hemoglobin which I would expect along with rare bacteria but nitrite and leukocyte negative.  Plan to send this for culture.  Patient's pain is well controlled here in the emergency department.  We discussed that given the size of the stone it could require stenting for passage does have a small chance of passing on its own.  Patient is comfortable and appears appropriate for outpatient follow-up.   Reevaluation with update and discussion with patient. On re-evaluation the patient is feeling better. Pain reduced with meds here. Discussed labs and CT findings. She will call for Urology follow up. Number provided at discharge along with referral but she is known to that practice.   Disposition: discharge  ____________________________________________  FINAL CLINICAL IMPRESSION(S) / ED DIAGNOSES  Final diagnoses:  Left ureteral stone     NEW OUTPATIENT MEDICATIONS STARTED DURING THIS VISIT:  New Prescriptions   IBUPROFEN (ADVIL) 800 MG TABLET    Take 1 tablet (800 mg total) by mouth every 6 (six) hours as needed for moderate pain.   ONDANSETRON (ZOFRAN) 4 MG TABLET    Take 1 tablet (4 mg total) by mouth every 6 (six) hours.   OXYCODONE-ACETAMINOPHEN (PERCOCET/ROXICET) 5-325 MG TABLET    Take 1 tablet by mouth every 6 (six) hours as needed for severe pain.   SENNA-DOCUSATE (SENOKOT-S) 8.6-50 MG TABLET    Take 1 tablet by mouth at bedtime as needed for mild constipation or moderate constipation.   TAMSULOSIN (FLOMAX) 0.4 MG CAPS CAPSULE    Take 1 capsule (0.4 mg total) by mouth daily for 14 days.    Note:  This document was prepared using Dragon voice recognition software and may include unintentional dictation errors.  Nanda Quinton, MD, [facility] Emergency Medicine    Babak Lucus, [facility], MD 10/05/21 1324

## 2021-10-05 NOTE — ED Triage Notes (Signed)
Pt arrived via POV, c/o left sided flank pain for a week, states the pain worsening the past 24 hrs. Denies any dysuria or blood in urine. States hx of kidney stones.

## 2021-10-05 NOTE — ED Notes (Addendum)
This RN called to lobby, pt states she felt as though she was going to pass out. Pt diaphoretic, slow to answer questions. Bp 80s/50s. MD J Long made aware

## 2021-10-05 NOTE — ED Provider Triage Note (Signed)
Emergency Medicine Provider Triage Evaluation Note  [name] Bass , a 36 y.o. female  was evaluated in triage.  Pt complains of left-sided flank pain.  Pain has been constant x1 week and has become worse in the last 24 hours.  Patient reports she has a history of kidney stones and this feels similar.  Pain radiates to left lower quadrant.  Patient denies any nausea, vomiting fever, chills, difficulty urinating, dysuria, hematuria, urinary urgency, vaginal pain, vaginal bleeding, vaginal discharge.  Review of Systems  Positive: Flank pain, abdominal pain Negative: See above  Physical Exam  BP (!) 149/96 (BP Location: Left Arm)    Pulse 88    Temp 98.5 F (36.9 C) (Oral)    Resp 16    SpO2 99%  Gen:   Awake, no distress   Resp:  Normal effort  MSK:   Moves extremities without difficulty  Other:  Abdomen soft, nondistended, nontender, no guarding or rebound tenderness.  Left CVA tenderness  Medical Decision Making  Medically screening exam initiated at 8:56 AM.  Appropriate orders placed.  Camylle Parham was informed that the remainder of the evaluation will be completed by another provider, this initial triage assessment does not replace that evaluation, and the importance of remaining in the ED until their evaluation is complete.     Loni Beckwith, [place] 10/05/21 [phone]

## 2021-10-05 NOTE — Discharge Instructions (Signed)

## 2021-10-06 LAB — URINE CULTURE: Culture: NO GROWTH

## 2021-10-11 ENCOUNTER — Other Ambulatory Visit: Payer: Self-pay | Admitting: Urology

## 2021-10-17 ENCOUNTER — Encounter (HOSPITAL_BASED_OUTPATIENT_CLINIC_OR_DEPARTMENT_OTHER): Payer: Self-pay | Admitting: Urology

## 2021-10-17 ENCOUNTER — Other Ambulatory Visit: Payer: Self-pay

## 2021-10-17 NOTE — Progress Notes (Signed)
Spoke w/ via phone for pre-op interview: patient Lab needs dos: UPT and EKG Lab results: CMP 10/05/21 COVID test: patient states asymptomatic no test needed. Arrive at 0930 NPO after MN except clear liquids.Clear liquids from MN until 08:30 am Med rec completed. Medications to take morning of surgery: none Diabetic medication: NA Patient instructed no nail polish to be worn day of surgery. Patient instructed to bring photo id and insurance card day of surgery. Patient aware to have Driver (ride ) / caregiver for 24 hours after surgery. Patient Special Instructions: NA Pre-Op special Istructions: NA Patient verbalized understanding of instructions that were given at this phone interview. Patient denies shortness of breath, chest pain, fever, cough at this phone interview.

## 2021-10-20 ENCOUNTER — Encounter (HOSPITAL_BASED_OUTPATIENT_CLINIC_OR_DEPARTMENT_OTHER)
Admission: RE | Disposition: A | Discharge status: Home / Self Care | Payer: Self-pay | Source: Home / Self Care | Attending: Urology

## 2021-10-20 ENCOUNTER — Ambulatory Visit (HOSPITAL_BASED_OUTPATIENT_CLINIC_OR_DEPARTMENT_OTHER): Payer: Self-pay | Admitting: Certified Registered"

## 2021-10-20 ENCOUNTER — Encounter (HOSPITAL_BASED_OUTPATIENT_CLINIC_OR_DEPARTMENT_OTHER): Payer: Self-pay | Admitting: Urology

## 2021-10-20 ENCOUNTER — Other Ambulatory Visit: Payer: Self-pay

## 2021-10-20 ENCOUNTER — Ambulatory Visit (HOSPITAL_BASED_OUTPATIENT_CLINIC_OR_DEPARTMENT_OTHER)
Admission: RE | Admit: 2021-10-20 | Discharge: 2021-10-20 | Disposition: A | Discharge status: Home / Self Care | Payer: Self-pay | Attending: Urology | Admitting: Urology

## 2021-10-20 DIAGNOSIS — N201 Calculus of ureter: Secondary | ICD-10-CM

## 2021-10-20 DIAGNOSIS — N132 Hydronephrosis with renal and ureteral calculous obstruction: Secondary | ICD-10-CM | POA: Insufficient documentation

## 2021-10-20 DIAGNOSIS — Z87891 Personal history of nicotine dependence: Secondary | ICD-10-CM | POA: Insufficient documentation

## 2021-10-20 DIAGNOSIS — N200 Calculus of kidney: Secondary | ICD-10-CM

## 2021-10-20 DIAGNOSIS — I10 Essential (primary) hypertension: Secondary | ICD-10-CM | POA: Insufficient documentation

## 2021-10-20 HISTORY — PX: CYSTOSCOPY/URETEROSCOPY/HOLMIUM LASER/STENT PLACEMENT: SHX6546

## 2021-10-20 LAB — POCT PREGNANCY, URINE: Preg Test, Ur: NEGATIVE

## 2021-10-20 SURGERY — CYSTOSCOPY/URETEROSCOPY/HOLMIUM LASER/STENT PLACEMENT
Anesthesia: General | Site: Ureter | Laterality: Left

## 2021-10-20 MED ORDER — FENTANYL CITRATE (PF) 100 MCG/2ML IJ SOLN
25.0000 ug | INTRAMUSCULAR | Status: DC | PRN
  Administered 2021-10-20 (×2): 25 ug via INTRAVENOUS

## 2021-10-20 MED ORDER — ONDANSETRON HCL 4 MG/2ML IJ SOLN
INTRAMUSCULAR | Status: AC
  Filled 2021-10-20: qty 2

## 2021-10-20 MED ORDER — CIPROFLOXACIN IN D5W 400 MG/200ML IV SOLN
INTRAVENOUS | Status: AC
  Filled 2021-10-20: qty 200

## 2021-10-20 MED ORDER — CIPROFLOXACIN HCL 500 MG PO TABS: 500.0000 mg | ORAL_TABLET | Freq: Once | ORAL | 0 refills | Status: AC

## 2021-10-20 MED ORDER — AMISULPRIDE (ANTIEMETIC) 5 MG/2ML IV SOLN: 10.0000 mg | Freq: Once | INTRAVENOUS | Status: DC | PRN

## 2021-10-20 MED ORDER — PHENAZOPYRIDINE HCL 200 MG PO TABS: 200.0000 mg | ORAL_TABLET | Freq: Three times a day (TID) | ORAL | 0 refills | Status: DC | PRN

## 2021-10-20 MED ORDER — FENTANYL CITRATE (PF) 100 MCG/2ML IJ SOLN
INTRAMUSCULAR | Status: AC
  Filled 2021-10-20: qty 2

## 2021-10-20 MED ORDER — SODIUM CHLORIDE 0.9 % IR SOLN
Status: DC | PRN
  Administered 2021-10-20: 3000 mL

## 2021-10-20 MED ORDER — ACETAMINOPHEN 325 MG PO TABS: 325.0000 mg | ORAL_TABLET | ORAL | Status: DC | PRN

## 2021-10-20 MED ORDER — LIDOCAINE HCL (PF) 2 % IJ SOLN
INTRAMUSCULAR | Status: AC
  Filled 2021-10-20: qty 5

## 2021-10-20 MED ORDER — LACTATED RINGERS IV SOLN: INTRAVENOUS | Status: DC

## 2021-10-20 MED ORDER — PHENYLEPHRINE 40 MCG/ML (10ML) SYRINGE FOR IV PUSH (FOR BLOOD PRESSURE SUPPORT)
PREFILLED_SYRINGE | INTRAVENOUS | Status: DC | PRN
  Administered 2021-10-20: 40 ug via INTRAVENOUS

## 2021-10-20 MED ORDER — OXYCODONE HCL 5 MG/5ML PO SOLN: 5.0000 mg | Freq: Once | ORAL | Status: AC | PRN

## 2021-10-20 MED ORDER — OXYCODONE HCL 5 MG PO TABS
ORAL_TABLET | ORAL | Status: AC
  Filled 2021-10-20: qty 1

## 2021-10-20 MED ORDER — PROPOFOL 10 MG/ML IV BOLUS
INTRAVENOUS | Status: DC | PRN
  Administered 2021-10-20: 200 mg via INTRAVENOUS

## 2021-10-20 MED ORDER — CIPROFLOXACIN IN D5W 400 MG/200ML IV SOLN
400.0000 mg | Freq: Two times a day (BID) | INTRAVENOUS | Status: DC
  Administered 2021-10-20: 400 mg via INTRAVENOUS

## 2021-10-20 MED ORDER — MIDAZOLAM HCL 2 MG/2ML IJ SOLN
INTRAMUSCULAR | Status: AC
Start: 2021-10-20 — End: ?
  Filled 2021-10-20: qty 2

## 2021-10-20 MED ORDER — KETOROLAC TROMETHAMINE 30 MG/ML IJ SOLN
INTRAMUSCULAR | Status: DC | PRN
  Administered 2021-10-20: 30 mg via INTRAVENOUS

## 2021-10-20 MED ORDER — IOHEXOL 300 MG/ML  SOLN
INTRAMUSCULAR | Status: DC | PRN
  Administered 2021-10-20: 10 mL

## 2021-10-20 MED ORDER — OXYCODONE HCL 5 MG PO TABS
5.0000 mg | ORAL_TABLET | Freq: Once | ORAL | Status: AC | PRN
  Administered 2021-10-20: 5 mg via ORAL

## 2021-10-20 MED ORDER — KETOROLAC TROMETHAMINE 30 MG/ML IJ SOLN
INTRAMUSCULAR | Status: AC
  Filled 2021-10-20: qty 1

## 2021-10-20 MED ORDER — PHENYLEPHRINE 40 MCG/ML (10ML) SYRINGE FOR IV PUSH (FOR BLOOD PRESSURE SUPPORT)
PREFILLED_SYRINGE | INTRAVENOUS | Status: AC
  Filled 2021-10-20: qty 10

## 2021-10-20 MED ORDER — TRAMADOL HCL 50 MG PO TABS: 50.0000 mg | ORAL_TABLET | Freq: Four times a day (QID) | ORAL | 0 refills | Status: DC | PRN

## 2021-10-20 MED ORDER — MIDAZOLAM HCL 2 MG/2ML IJ SOLN
INTRAMUSCULAR | Status: DC | PRN
  Administered 2021-10-20: 2 mg via INTRAVENOUS

## 2021-10-20 MED ORDER — ACETAMINOPHEN 160 MG/5ML PO SOLN: 325.0000 mg | ORAL | Status: DC | PRN

## 2021-10-20 MED ORDER — ACETAMINOPHEN 10 MG/ML IV SOLN: 1000.0000 mg | Freq: Once | INTRAVENOUS | Status: DC | PRN

## 2021-10-20 MED ORDER — FENTANYL CITRATE (PF) 100 MCG/2ML IJ SOLN
INTRAMUSCULAR | Status: DC | PRN
  Administered 2021-10-20: 75 ug via INTRAVENOUS
  Administered 2021-10-20: 25 ug via INTRAVENOUS

## 2021-10-20 MED ORDER — LIDOCAINE 2% (20 MG/ML) 5 ML SYRINGE
INTRAMUSCULAR | Status: DC | PRN
  Administered 2021-10-20: 30 mg via INTRAVENOUS

## 2021-10-20 MED ORDER — DEXAMETHASONE SODIUM PHOSPHATE 10 MG/ML IJ SOLN
INTRAMUSCULAR | Status: AC
  Filled 2021-10-20: qty 1

## 2021-10-20 MED ORDER — ONDANSETRON HCL 4 MG/2ML IJ SOLN
INTRAMUSCULAR | Status: DC | PRN
Start: 2021-10-20 — End: 2021-10-20
  Administered 2021-10-20: 4 mg via INTRAVENOUS

## 2021-10-20 MED ORDER — DEXAMETHASONE SODIUM PHOSPHATE 10 MG/ML IJ SOLN
INTRAMUSCULAR | Status: DC | PRN
  Administered 2021-10-20: 5 mg via INTRAVENOUS

## 2021-10-20 SURGICAL SUPPLY — 25 items
BAG DRAIN URO-CYSTO SKYTR STRL (DRAIN) ×2 IMPLANT
BAG DRN UROCATH (DRAIN) ×1
BASKET LASER NITINOL 1.9FR (BASKET) IMPLANT
BASKET STONE 1.7 NGAGE (UROLOGICAL SUPPLIES) ×1 IMPLANT
BSKT STON RTRVL 120 1.9FR (BASKET)
CATH URET 5FR 28IN OPEN ENDED (CATHETERS) ×2 IMPLANT
CATH URET DUAL LUMEN 6-10FR 50 (CATHETERS) IMPLANT
CLOTH BEACON ORANGE TIMEOUT ST (SAFETY) ×2 IMPLANT
EXTRACTOR STONE 1.7FRX115CM (UROLOGICAL SUPPLIES) IMPLANT
GLOVE SURG ENC MOIS LTX SZ7.5 (GLOVE) ×2 IMPLANT
GOWN STRL REUS W/TWL XL LVL3 (GOWN DISPOSABLE) ×2 IMPLANT
GUIDEWIRE STR DUAL SENSOR (WIRE) ×3 IMPLANT
IV NS IRRIG 3000ML ARTHROMATIC (IV SOLUTION) ×4 IMPLANT
KIT BALLIN UROMAX 15FX10 (LABEL) IMPLANT
KIT TURNOVER CYSTO (KITS) ×2 IMPLANT
MANIFOLD NEPTUNE II (INSTRUMENTS) ×2 IMPLANT
NS IRRIG 500ML POUR BTL (IV SOLUTION) ×2 IMPLANT
PACK CYSTO (CUSTOM PROCEDURE TRAY) ×2 IMPLANT
SET HIGH PRES BAL DIL (LABEL) ×2
SHEATH URETERAL 12FRX35CM (MISCELLANEOUS) ×1 IMPLANT
STENT URET 6FRX26 CONTOUR (STENTS) ×1 IMPLANT
TRACTIP FLEXIVA PULS ID 200XHI (Laser) IMPLANT
TRACTIP FLEXIVA PULSE ID 200 (Laser)
TUBE CONNECTING 12X1/4 (SUCTIONS) ×1 IMPLANT
TUBING UROLOGY SET (TUBING) ×2 IMPLANT

## 2021-10-20 NOTE — Anesthesia Procedure Notes (Signed)
Procedure Name: LMA Insertion ?Date/Time: 10/20/2021 11:48 AM ?Performed by: Suan Halter, CRNA ?Pre-anesthesia Checklist: Patient identified, Emergency Drugs available, Suction available and Patient being monitored ?Patient Re-evaluated:Patient Re-evaluated prior to induction ?Oxygen Delivery Method: Circle system utilized ?Preoxygenation: Pre-oxygenation with 100% oxygen ?Induction Type: IV induction ?Ventilation: Mask ventilation without difficulty ?LMA: LMA inserted ?LMA Size: 4.0 ?Number of attempts: 1 ?Airway Equipment and Method: Bite block ?Placement Confirmation: positive ETCO2 ?Tube secured with: Tape ?Dental Injury: Teeth and Oropharynx as per pre-operative assessment  ? ? ? ? ?

## 2021-10-20 NOTE — Anesthesia Postprocedure Evaluation (Signed)
Anesthesia Post Note ? ?Patient: [name] Bass ? ?Procedure(s) Performed: CYSTOSCOPY LEFT RETROGRADE PYELOGRAM, URETERAL BALLOON DILATION, URETEROSCOPY/HOLMIUM LASER/STENT PLACEMENT (Left: Ureter) ? ?  ? ?Patient location during evaluation: PACU ?Anesthesia Type: General ?Level of consciousness: awake and alert ?Pain management: pain level controlled ?Vital Signs Assessment: post-procedure vital signs reviewed and stable ?Respiratory status: spontaneous breathing, nonlabored ventilation, respiratory function stable and patient connected to nasal cannula oxygen ?Cardiovascular status: blood pressure returned to baseline and stable ?Postop Assessment: no apparent nausea or vomiting ?Anesthetic complications: no ? ? ?No notable events documented. ? ?Last Vitals:  ?Vitals:  ? 10/20/21 1330 10/20/21 1340  ?BP: 112/87 123/85  ?Pulse: 92 80  ?Resp: 16 13  ?Temp:    ?SpO2: 97% 97%  ?  ?Last Pain:  ?Vitals:  ? 10/20/21 1305  ?TempSrc:   ?PainSc: 0-No pain  ? ? ?  ?  ?  ?  ?  ?  ? ?Effie Berkshire ? ? ? ? ?

## 2021-10-20 NOTE — Discharge Instructions (Addendum)
DISCHARGE INSTRUCTIONS FOR KIDNEY STONE/URETERAL STENT  ? ?MEDICATIONS:  ?1. Resume all your other meds from home - except do not take any extra narcotic pain meds that you may have at home.  ?2. Pyridium is to help with the burning/stinging when you urinate. ?3. Tramadol is for moderate/severe pain, otherwise taking upto 1000 mg every 6 hours of plainTylenol will help treat your pain.   ?4. Take Cipro one hour prior to removal of your stent.  ? ?ACTIVITY:  ?1. No strenuous activity x 1week  ?2. No driving while on narcotic pain medications  ?3. Drink plenty of water  ?4. Continue to walk at home - you can still get blood clots when you are at home, so keep active, but don't over do it.  ?5. May return to work/school tomorrow or when you feel ready  ? ?BATHING:  ?1. You can shower and we recommend daily showers  ?2. You have a string coming from your urethra: The stent string is attached to your ureteral stent. Do not pull on this.  ? ?SIGNS/SYMPTOMS TO CALL:  ?Please call us if you have a fever greater than 101.5, uncontrolled nausea/vomiting, uncontrolled pain, dizziness, unable to urinate, bloody urine, chest pain, shortness of breath, leg swelling, leg pain, redness around wound, drainage from wound, or any other concerns or questions.  ? ?You can reach [place] at [phone].  ? ?FOLLOW-UP:  ?1. You have an appointment in 6 weeks with a ultrasound of your kidneys prior.  ?2. You have a string attached to your stent, you may remove it on Monday, March 6th. To do this, pull the strings until the stents are completely removed. You may feel an odd sensation in your back.  ? ? ?Post Anesthesia Home Care Instructions ? ?Activity: ?Get plenty of rest for the remainder of the day. A responsible individual must stay with you for 24 hours following the procedure.  ?For the next 24 hours, DO NOT: ?-Drive a car ?-Paediatric nurse ?-Drink alcoholic beverages ?-Take any medication unless instructed by your physician ?-Make any  legal decisions or sign important papers. ? ?Meals: ?Start with liquid foods such as gelatin or soup. Progress to regular foods as tolerated. Avoid greasy, spicy, heavy foods. If nausea and/or vomiting occur, drink only clear liquids until the nausea and/or vomiting subsides. Call your physician if vomiting continues. ? ?Special Instructions/Symptoms: ?Your throat may feel dry or sore from the anesthesia or the breathing tube placed in your throat during surgery. If this causes discomfort, gargle with warm salt water. The discomfort should disappear within 24 hours. ? ?If you had a scopolamine patch placed behind your ear for the management of post- operative nausea and/or vomiting: ? ?1. The medication in the patch is effective for 72 hours, after which it should be removed.  Wrap patch in a tissue and discard in the trash. Wash hands thoroughly with soap and water. ?2. You may remove the patch earlier than 72 hours if you experience unpleasant side effects which may include dry mouth, dizziness or visual disturbances. ?3. Avoid touching the patch. Wash your hands with soap and water after contact with the patch. ?    ?No Advil/Motrin/ibuprofen until 7pm or after ?

## 2021-10-20 NOTE — Transfer of Care (Signed)
Immediate Anesthesia Transfer of Care Note ? ?Patient: [name] Bass ? ?Procedure(s) Performed: Procedure(s) (LRB): ?CYSTOSCOPY LEFT RETROGRADE PYELOGRAM, URETERAL BALLOON DILATION, URETEROSCOPY/HOLMIUM LASER/STENT PLACEMENT (Left) ? ?Patient Location: PACU ? ?Anesthesia Type: General ? ?Level of Consciousness: awake, oriented, sedated and patient cooperative ? ?Airway & Oxygen Therapy: Patient Spontanous Breathing and Patient connected to face mask oxygen ? ?Post-op Assessment: Report given to PACU RN and Post -op Vital signs reviewed and stable ? ?Post vital signs: Reviewed and stable ? ?Complications: No apparent anesthesia complications ? ?Last Vitals:  ?Vitals Value Taken Time  ?BP 82/46 10/20/21 1305  ?Temp    ?Pulse 66 10/20/21 1306  ?Resp 11 10/20/21 1306  ?SpO2 97 % 10/20/21 1306  ?Vitals shown include unvalidated device data. ? ?Last Pain:  ?Vitals:  ? 10/20/21 0945  ?TempSrc: Oral  ?PainSc: 0-No pain  ?   ? ?  ? ?Complications: No notable events documented. ?

## 2021-10-20 NOTE — H&P (Signed)
10/06/2021: Patient with past medical history of bilateral nephrolithiasis having been treated in the past with ureteroscopy. We see her here in clinic intermittently due to symptom exacerbations but follow-up has been inconsistent largely due to patient's lack of insurance. CT stone protocol study performed in late October when she was evaluated by the on-call provider indicated bilateral nonobstructing calculi with no good explanation for her acute exacerbation of left-sided pain and discomfort at that time. She was seen yesterday at the emergency department at [facility] for evaluation of left-sided pain and discomfort suggestive of obstructive uropathy. CT imaging obtained did show an obstructing 6 to 7 mm left UPJ stone that is seen on [facility] as well. She had some leukocytosis with white blood cell count of 16 but renal function was preserved. UA not overly concerning for an underlying infectious process but urine culture was sent and as of this time it has resulted negative for bacterial growth.   Patient continues to be intermittently symptomatic complaining of left greater than right lower back pain with radiation into the left flank. Also associated with some nausea but no vomiting. She denies dysuria or gross hematuria and has had no increased difficulty voiding otherwise. She denies fevers or chills.,   Patient continues tamsulosin, as needed Zofran, as needed Percocet which was prescribed at time of ED discharge.     ALLERGIES: Amoxicillin CAPS - Skin Rash    MEDICATIONS: Percocet 5 mg-325 mg tablet 1-2 tablet PO Q 6 H PRN  Procardia     GU PSH: Cysto Remove Stent FB Sim - 01/23/2020 Cysto Uretero Lithotripsy, Left - 01/17/2020 ESWL - 2018 Ureteroscopic laser litho, Right - 01/17/2020 Ureteroscopic stone removal - 2013     NON-GU PSH: Bilateral Tubal Ligation, 2013 Cholecystectomy (open) - 2013 Remove Tonsils - 2013     GU PMH: Flank Pain, Right - 06/15/2021, Right, - 11/01/2020  (Acute), Right, Bilateral renal shadows noted but no definitive right renal or ureteral calculus noted. Stable multiple pelvic calcifications noted but unchanged from previous KUB. Ketorolac 60 mg IM today. Ketorolac 10 mg 1 po Q6 hrs prn. Instructed if pain persists or worsens will need CT urogram. May alternate ice/heat and may also use PRN Tylenol. , - 2018 Renal calculus - 06/15/2021, She has a stable LLP stone on KUB. , - 11/01/2020 Microscopic hematuria - 11/01/2020 Ureteral calculus, She has right flank pain and microhematuria and probably has a small right ureteral stone. I don't see a clear stone on KUB in the ureter but she has multiple phleboliths. I have given her tamsulosin and percocet and will have her return in 2 weeks. If she doesn't improve, she will need a CT. - 11/01/2020, Left, The patient has a left proximal ureteral stone with obstruction and associated renal colic symptoms. Currently, her pain is controlled. She would like to get this taken care of ASAP. We discussed treatment options, and the patient opted for shockwave lithotripsy., - 2018, Calculus of ureter, - 2014 Renal and ureteral calculus - 01/23/2020, - 01/16/2020    NON-GU PMH: Personal history of other specified conditions, History of heartburn - 2014 Encounter for general adult medical examination without abnormal findings, Encounter for preventive health examination Hypercholesterolemia Hypertension    FAMILY HISTORY: 2 daughters - Daughter 1 son - Son Death In The Family Mother - Mother Family Health Status Number - Mother   SOCIAL HISTORY: Marital Status: Single Preferred Language: English; Ethnicity: Not Hispanic Or Latino; Race: White Current Smoking Status: Patient does not  smoke anymore.   Tobacco Use Assessment Completed: Used Tobacco in last 30 days? Does not use smokeless tobacco. Does drink.  Does not use drugs. Drinks 2 caffeinated drinks per day. Patient's occupation Scientist, research (life sciences).     REVIEW OF SYSTEMS:    GU Review Female:   Patient denies frequent urination, hard to postpone urination, burning /pain with urination, get up at night to urinate, leakage of urine, stream starts and stops, trouble starting your stream, have to strain to urinate, and being pregnant.  Gastrointestinal (Upper):   Patient reports nausea. Patient denies vomiting and indigestion/ heartburn.  Gastrointestinal (Lower):   Patient denies diarrhea and constipation.  Constitutional:   Patient denies fever, night sweats, weight loss, and fatigue.  Skin:   Patient denies skin rash/ lesion and itching.  Eyes:   Patient denies blurred vision and double vision.  Ears/ Nose/ Throat:   Patient denies sore throat and sinus problems.  Hematologic/Lymphatic:   Patient denies swollen glands and easy bruising.  Cardiovascular:   Patient denies leg swelling and chest pains.  Respiratory:   Patient denies cough and shortness of breath.  Endocrine:   Patient denies excessive thirst.  Musculoskeletal:   Patient reports back pain. Patient denies joint pain.  Neurological:   Patient denies headaches and dizziness.  Psychologic:   Patient denies depression and anxiety.   VITAL SIGNS:      10/06/2021 03:17 PM  Weight 200 lb / 90.72 kg  Height 70 in / 177.8 cm  BP 119/79 mmHg  Pulse 71 /min  Temperature 98.7 F / 37.0 C  BMI 28.7 kg/m   MULTI-SYSTEM PHYSICAL EXAMINATION:    Constitutional: Well-nourished. No physical deformities. Normally developed. Good grooming.  Neck: Neck symmetrical, not swollen. Normal tracheal position.  Respiratory: No labored breathing, no use of accessory muscles.   Cardiovascular: Normal temperature, normal extremity pulses, no swelling, no varicosities.  Skin: No paleness, no jaundice, no cyanosis. No lesion, no ulcer, no rash.  Neurologic / Psychiatric: Oriented to time, oriented to place, oriented to person. No depression, no anxiety, no agitation.  Gastrointestinal: No mass, no  tenderness, no rigidity, non obese abdomen. Mild left CVA tenderness but no guarding or rebounding appreciated. No palpable right CVA or flank tenderness on today's exam.  Musculoskeletal: Normal gait and station of head and neck.     Complexity of Data:  Source Of History:  Patient, Medical Record Summary  Lab Test Review:   CBC with Diff, CMP  Records Review:   Previous Doctor Records, Previous Hospital Records, Previous Patient Records  Urine Test Review:   Urinalysis, Urine Culture  X-Ray Review: C.T. Abdomen/Pelvis: Reviewed Films. Reviewed Report.    Notes:                     CLINICAL DATA: Acute left flank pain.     EXAM:  CT ABDOMEN AND PELVIS WITHOUT CONTRAST     TECHNIQUE:  Multidetector CT imaging of the abdomen and pelvis was performed  following the standard protocol without IV contrast.     RADIATION DOSE REDUCTION: This exam was performed according to the  departmental dose-optimization program which includes automated  exposure control, adjustment of the mA and/or kV according to  patient size and/or use of iterative reconstruction technique.     COMPARISON: June 15, 2021.     FINDINGS:  Lower chest: No acute abnormality.     Hepatobiliary: No focal liver abnormality is seen. Status post  cholecystectomy. No  biliary dilatation.     Pancreas: Unremarkable. No pancreatic ductal dilatation or  surrounding inflammatory changes.     Spleen: Normal in size without focal abnormality.     Adrenals/Urinary Tract: Adrenal glands appear normal. Bilateral  nephrolithiasis is noted. Moderate left hydronephrosis is noted with  perinephric stranding secondary to 9 mm calculus at the left  ureteropelvic junction. Urinary bladder is unremarkable.     Stomach/Bowel: Stomach is within normal limits. Appendix appears  normal. No evidence of bowel wall thickening, distention, or  inflammatory changes.     Vascular/Lymphatic: No significant vascular findings are present.  No  enlarged abdominal or pelvic lymph nodes.     Reproductive: Uterus and bilateral adnexa are unremarkable.     Other: No abdominal wall hernia or abnormality. No abdominopelvic  ascites.     Musculoskeletal: No acute or significant osseous findings.     IMPRESSION:  Moderate left hydronephrosis with perinephric stranding is noted  secondary to 9 mm calculus at the left ureteropelvic junction.  Bilateral nephrolithiasis is noted.        Electronically Signed  By: Marijo Conception M.D.  On: 10/05/2021 11:17   PROCEDURES: None   ASSESSMENT:      ICD-10 Details  1 GU:   Ureteral calculus - N20.1 Left, Undiagnosed New Problem  2   Renal calculus - N20.0 Chronic, Exacerbation   PLAN:           Schedule Return Visit/Planned Activity: Next Available Appointment - Schedule Surgery, Follow up MD          Document Letter(s):  Created for Patient: Clinical Summary         Notes:   Patient with a symptomatic obstructing left proximal ureteral calculus. Due to the size of the stone, I am not sure if she would be able to successfully pass with medical expulsive therapy. Due to her persistent symptoms she is having to miss work and she is most interested in proceeding with definitive intervention. She does not want to have shockwave lithotripsy although this was discussed due to past experience. She would prefer ureteroscopy.   For ureteroscopy I described the risks which include heart attack, stroke, pulmonary embolus, death, bleeding, infection, damage to contiguous structures, positioning injury, ureteral stricture, ureteral avulsion, ureteral injury, need for ureteral stent, inability to perform ureteroscopy, need for an interval procedure, inability to clear stone burden, stent discomfort and pain.   I will discuss with Dr. Louis Meckel and if agreeable, the patient will be scheduled for next available. ED follow-up instructions given for poorly controlled pain and discomfort refractory to  previously prescribed medication, painful inability to void, uncontrollable nausea/vomiting. She may continue oxycodone with acetaminophen as needed, I also recommended supplementing with ibuprofen as well especially for mild to moderate severity. She will continue tamsulosin as previously prescribed. She has Zofran to take as needed for nausea.

## 2021-10-20 NOTE — Interval H&P Note (Signed)
History and Physical Interval Note: ? ?10/20/2021 ?11:37 AM ? ?[name] Bass  has presented today for surgery, with the diagnosis of LEFT URETERAL STONE.  The various methods of treatment have been discussed with the patient and family. After consideration of risks, benefits and other options for treatment, the patient has consented to  Procedure(s): ?CYSTOSCOPY LEFT RETROGRADE PYELOGRAM URETEROSCOPY/HOLMIUM LASER/STENT PLACEMENT (Left) as a surgical intervention.  The patient's history has been reviewed, patient examined, no change in status, stable for surgery.  I have reviewed the patient's chart and labs.  Questions were answered to the patient's satisfaction.   ? ? ?Crist Fat ? ? ?

## 2021-10-20 NOTE — Op Note (Signed)
Preoperative diagnosis: left ureteral calculus ? ?Postoperative diagnosis: left ureteral calculus ? ?Procedure: ? ?Cystoscopy ?left ureteroscopy and stone removal ?Ureteroscopic laser lithotripsy ?left 74F x 26cm ureteral stent placement  ?left retrograde pyelography with interpretation ? ?Surgeon: Crist Fat, MD ? ?Anesthesia: General ? ?Complications: None ? ?Intraoperative findings: left retrograde pyelography demonstrated a filling defect within the left ureter consistent with the patient?s known calculus without other abnormalities. ? ?EBL: Minimal ? ?Specimens: ?left ureteral calculus ? ?Disposition of specimens: [facility] for stone analysis ? ?Indication: [name] Bass is a 36 y.o.   patient with a left ureteral stone and associated left symptoms. After reviewing the management options for treatment, the patient elected to proceed with the above surgical procedure(s). We have discussed the potential benefits and risks of the procedure, side effects of the proposed treatment, the likelihood of the patient achieving the goals of the procedure, and any potential problems that might occur during the procedure or recuperation. Informed consent has been obtained. ? ? ?Description of procedure: ? ?The patient was taken to the operating room and general anesthesia was induced.  The patient was placed in the dorsal lithotomy position, prepped and draped in the usual sterile fashion, and preoperative antibiotics were administered. A preoperative time-out was performed.  ? ?Cystourethroscopy was performed.  The patient?s urethra was examined and was normal.  There was no evidence for any bladder tumors, stones, or other mucosal pathology.   ? ?Attention then turned to the left ureteral orifice and a ureteral catheter was used to intubate the ureteral orifice.  Omnipaque contrast was injected through the ureteral catheter and a retrograde pyelogram was performed with findings as dictated  above. ? ?A 0.38 sensor guidewire was then advanced up the left ureter into the renal pelvis under fluoroscopic guidance.  I then tried to get the single lumen flexible ureteroscope up the ureter, but was unsuccessful, could not navigate beyond the distal ureter.  I opted at this point to advance a 12/14 French 35 cm ureteral access sheath.  I advanced the inner portion first and then both together, again I was unable to get it past the distal ureter.  As such, I dilated the distal ureter using ureteral balloon dilator, 10 cm.  I dilated twice holding each dilation for approximately 90 seconds.  I then was able to advance the 12-14 ureteral access sheath up easier.  I then remove the inner portion of the sheath and the wire. ? ?I then encountered the patient's stone at the UVJ and pushed it up to the upper pole.  Using a laser I fragmented into small enough pieces to remove all the pieces using a basket.  The patient had numerous small stones.  All stones were removed.  I slowly backed out the ureteral access sheath and reinspection of the ureter revealed no remaining visible stones or fragments.  ? ?The wire was then backloaded through the cystoscope and a ureteral stent was advance over the wire using Seldinger technique.  The stent was positioned appropriately under fluoroscopic and cystoscopic guidance.  The wire was then removed with an adequate stent curl noted in the renal pelvis as well as in the bladder. ? ?The bladder was then emptied and the procedure ended.  The patient appeared to tolerate the procedure well and without complications.  The patient was able to be awakened and transferred to the recovery unit in satisfactory condition.  ? ?Disposition: The tether of the stent was left on and  tucked inside  the patient's vagina.  Instructions for removing the stent have been provided to the patient. The patient has been scheduled for followup in 6 weeks with a renal ultrasound.   ?  ?

## 2021-10-20 NOTE — Anesthesia Preprocedure Evaluation (Addendum)
Anesthesia Evaluation  ?Patient identified by MRN, date of birth, ID band ?Patient awake ? ? ? ?Reviewed: ?Allergy & Precautions, NPO status , Patient's Chart, lab work & pertinent test results ? ?Airway ?Mallampati: I ? ?TM Distance: >3 FB ?Neck ROM: Full ? ? ? Dental ? ?(+) Teeth Intact, Dental Advisory Given ?  ?Pulmonary ?former smoker,  ?  ?breath sounds clear to auscultation ? ? ? ? ? ? Cardiovascular ?hypertension, Pt. on medications ? ?Rhythm:Regular Rate:Normal ? ? ?  ?Neuro/Psych ? Headaches, negative psych ROS  ? GI/Hepatic ?negative GI ROS, Neg liver ROS,   ?Endo/Other  ?negative endocrine ROS ? Renal/GU ?Renal InsufficiencyRenal disease  ? ?  ?Musculoskeletal ?negative musculoskeletal ROS ?(+)  ? Abdominal ?Normal abdominal exam  (+)   ?Peds ? Hematology ?negative hematology ROS ?(+)   ?Anesthesia Other Findings ? ? Reproductive/Obstetrics ? ?  ? ? ? ? ? ? ? ? ? ? ? ? ? ?  ?  ? ? ? ? ? ? ? ?Anesthesia Physical ? ?Anesthesia Plan ? ?ASA: 2 ? ?Anesthesia Plan: General  ? ?Post-op Pain Management:   ? ?Induction: Intravenous ? ?PONV Risk Score and Plan: 4 or greater and Ondansetron, Dexamethasone, Midazolam and Scopolamine patch - Pre-op ? ?Airway Management Planned: LMA ? ?Additional Equipment: None ? ?Intra-op Plan:  ? ?Post-operative Plan: Extubation in OR ? ?Informed Consent: I have reviewed the patients History and Physical, chart, labs and discussed the procedure including the risks, benefits and alternatives for the proposed anesthesia with the patient or authorized representative who has indicated his/her understanding and acceptance.  ? ? ? ?Dental advisory given ? ?Plan Discussed with: CRNA ? ?Anesthesia Plan Comments:   ? ? ? ? ? ?Anesthesia Quick Evaluation ? ?

## 2021-10-21 ENCOUNTER — Encounter (HOSPITAL_BASED_OUTPATIENT_CLINIC_OR_DEPARTMENT_OTHER): Payer: Self-pay | Admitting: Urology

## 2021-10-25 ENCOUNTER — Emergency Department (HOSPITAL_COMMUNITY): Payer: Self-pay

## 2021-10-25 ENCOUNTER — Emergency Department (HOSPITAL_COMMUNITY)
Admission: EM | Admit: 2021-10-25 | Discharge: 2021-10-25 | Disposition: A | Discharge status: Home / Self Care | Payer: Self-pay | Attending: Emergency Medicine | Admitting: Emergency Medicine

## 2021-10-25 ENCOUNTER — Other Ambulatory Visit: Payer: Self-pay

## 2021-10-25 ENCOUNTER — Encounter (HOSPITAL_COMMUNITY): Payer: Self-pay

## 2021-10-25 DIAGNOSIS — N202 Calculus of kidney with calculus of ureter: Secondary | ICD-10-CM | POA: Insufficient documentation

## 2021-10-25 DIAGNOSIS — N200 Calculus of kidney: Secondary | ICD-10-CM

## 2021-10-25 DIAGNOSIS — D72829 Elevated white blood cell count, unspecified: Secondary | ICD-10-CM | POA: Insufficient documentation

## 2021-10-25 DIAGNOSIS — R8289 Other abnormal findings on cytological and histological examination of urine: Secondary | ICD-10-CM | POA: Insufficient documentation

## 2021-10-25 LAB — COMPREHENSIVE METABOLIC PANEL
ALT: 27 U/L (ref 0–44)
AST: 23 U/L (ref 15–41)
Albumin: 4 g/dL (ref 3.5–5.0)
Alkaline Phosphatase: 51 U/L (ref 38–126)
Anion gap: 9 (ref 5–15)
BUN: 15 mg/dL (ref 6–20)
CO2: 25 mmol/L (ref 22–32)
Calcium: 8.8 mg/dL — ABNORMAL LOW (ref 8.9–10.3)
Chloride: 99 mmol/L (ref 98–111)
Creatinine, Ser: 0.95 mg/dL (ref 0.44–1.00)
GFR, Estimated: >60 mL/min (ref >60)
Glucose, Bld: 130 mg/dL — ABNORMAL HIGH (ref 70–99)
Potassium: 3.4 mmol/L — ABNORMAL LOW (ref 3.5–5.1)
Sodium: 133 mmol/L — ABNORMAL LOW (ref 135–145)
Total Bilirubin: 1.1 mg/dL (ref 0.3–1.2)
Total Protein: 6.9 g/dL (ref 6.5–8.1)

## 2021-10-25 LAB — CBC
HCT: 39.2 % (ref 36.0–46.0)
Hemoglobin: 13.8 g/dL (ref 12.0–15.0)
MCH: 31.3 pg (ref 26.0–34.0)
MCHC: 35.2 g/dL (ref 30.0–36.0)
MCV: 88.9 fL (ref 80.0–100.0)
Platelets: 366 10*3/uL (ref 150–400)
RBC: 4.41 MIL/uL (ref 3.87–5.11)
RDW: 11.8 % (ref 11.5–15.5)
WBC: 14 10*3/uL — ABNORMAL HIGH (ref 4.0–10.5)
nRBC: 0 % (ref 0.0–0.2)

## 2021-10-25 LAB — URINALYSIS, ROUTINE W REFLEX MICROSCOPIC
Bilirubin Urine: NEGATIVE
Glucose, UA: NEGATIVE mg/dL
Ketones, ur: NEGATIVE mg/dL
Leukocytes,Ua: NEGATIVE
Nitrite: NEGATIVE
Protein, ur: >=300 mg/dL — AB
RBC / HPF: >50 RBC/hpf — ABNORMAL HIGH (ref 0–5)
Specific Gravity, Urine: 1.024 (ref 1.005–1.030)
WBC, UA: >50 WBC/hpf — ABNORMAL HIGH (ref 0–5)
pH: 5 (ref 5.0–8.0)

## 2021-10-25 LAB — I-STAT BETA HCG BLOOD, ED (MC, WL, AP ONLY): I-stat hCG, quantitative: <5 m[IU]/mL (ref <5)

## 2021-10-25 LAB — LIPASE, BLOOD: Lipase: 29 U/L (ref 11–51)

## 2021-10-25 MED ORDER — ONDANSETRON HCL 4 MG/2ML IJ SOLN
4.0000 mg | Freq: Once | INTRAMUSCULAR | Status: AC
  Administered 2021-10-25: 4 mg via INTRAVENOUS
  Filled 2021-10-25: qty 2

## 2021-10-25 MED ORDER — LACTATED RINGERS IV BOLUS
1000.0000 mL | Freq: Once | INTRAVENOUS | Status: AC
Start: 2021-10-25 — End: 2021-10-25
  Administered 2021-10-25: 1000 mL via INTRAVENOUS

## 2021-10-25 MED ORDER — MORPHINE SULFATE (PF) 4 MG/ML IV SOLN
4.0000 mg | Freq: Once | INTRAVENOUS | Status: AC
  Administered 2021-10-25: 4 mg via INTRAVENOUS
  Filled 2021-10-25: qty 1

## 2021-10-25 MED ORDER — OXYCODONE-ACETAMINOPHEN 5-325 MG PO TABS: 1.0000 | ORAL_TABLET | Freq: Four times a day (QID) | ORAL | 0 refills | Status: DC | PRN

## 2021-10-25 MED ORDER — SULFAMETHOXAZOLE-TRIMETHOPRIM 800-160 MG PO TABS: 1.0000 | ORAL_TABLET | Freq: Two times a day (BID) | ORAL | 0 refills | Status: AC

## 2021-10-25 NOTE — Discharge Instructions (Signed)
Your CT shows that you have a 3 mm stone obstructing in your left ureter.  Fortunately, this is quite small and should pass on its own, likely today.  I sent you in some antibiotics that you need to start today as well and additional pain medication since you are almost out.  Continue taking the Flomax as directed and drinking plenty of water.  Follow-up outpatient at the urology clinic. ? ?If you become painful or are having symptoms again, you can give a call to the urology clinic and they may be able to help you without you having to come to the emergency department.  Please return to the emergency department though if you develop fever. ?

## 2021-10-25 NOTE — ED Provider Notes (Signed)
[facility] DEPT Provider Note   CSN: [id number] Arrival date & time: 10/25/21  [id number]     History  Chief Complaint  Patient presents with   Abdominal Pain    Delijah Cashman is a 36 y.o. female with frequent histories of kidney stones who presents to the ED for evaluation for urinary symptoms and left-sided flank pain after removing sided ureteral stent from ureteroscopy with laser lithotripsy performed on 10/20/2021.  Patient states that she was feeling fine shortly after the stent was removed yesterday however she progressively developed symptoms of kidney stone blockage again.  She is having dysuria, hematuria as well as obstructive symptoms of feeling sleepy without producing stream.  She has been taking Flomax, tramadol, Percocet and Pyridium at home for symptoms which she states does help, however she is concerned about the severity of her left flank pain and inability to pee despite urge.  She also endorses nausea.  She denies fever, chills, shortness of breath, diarrhea   Abdominal Pain     Home Medications Prior to Admission medications   Medication Sig Start Date End Date Taking? Authorizing Provider  oxyCODONE-acetaminophen (PERCOCET/ROXICET) 5-325 MG tablet Take 1 tablet by mouth every 6 (six) hours as needed for severe pain. 10/25/21  Yes Kathe Becton R, PA-C  sulfamethoxazole-trimethoprim (BACTRIM DS) 800-160 MG tablet Take 1 tablet by mouth 2 (two) times daily for 7 days. 10/25/21 11/01/21 Yes Tonye Pearson, PA-C  acetaminophen (TYLENOL) 500 MG tablet Take 1,500 mg by mouth every 6 (six) hours as needed for mild pain, moderate pain or headache.     [provider]  Cholecalciferol (VITAMIN D3) 50 MCG (2000 UT) TABS Take 2,000 Units by mouth every other day.    [provider]  Multiple Vitamins-Minerals (MULTIVITAMIN WITH MINERALS) tablet Take 1 tablet by mouth daily.    [provider]  naproxen (NAPROSYN) 500 MG  tablet Take 1 tablet (500 mg total) by mouth 2 (two) times daily as needed for moderate pain. 01/22/20   Lucrezia Starch, MD  NIFEdipine (PROCARDIA-XL/NIFEDICAL-XL) 30 MG 24 hr tablet Take 30 mg by mouth every evening. Pt takes in the pm 12/23/19   [provider]  ondansetron (ZOFRAN) 4 MG tablet Take 1 tablet (4 mg total) by mouth every 6 (six) hours. 10/05/21   Long, Wonda Olds, MD  phenazopyridine (PYRIDIUM) 200 MG tablet Take 1 tablet (200 mg total) by mouth 3 (three) times daily as needed for pain. 10/20/21   Ardis Hughs, MD  senna-docusate (SENOKOT-S) 8.6-50 MG tablet Take 1 tablet by mouth at bedtime as needed for mild constipation or moderate constipation. 10/05/21   Long, Wonda Olds, MD  traMADol (ULTRAM) 50 MG tablet Take 1-2 tablets (50-100 mg total) by mouth every 6 (six) hours as needed for moderate pain. 10/20/21   Ardis Hughs, MD      Allergies    Amoxicillin and Eggs or egg-derived products    Review of Systems   Review of Systems  Gastrointestinal:  Positive for abdominal pain.   Physical Exam Updated Vital Signs BP 117/82 (BP Location: Right Arm)    Pulse 78    Temp 97.9 F (36.6 C) (Oral)    Resp 18    Ht 5\' 10"  (1.778 m)    Wt 90.7 kg    SpO2 98%    BMI 28.70 kg/m  Physical Exam Vitals and nursing note reviewed.  Constitutional:      General: She is not in  acute distress.    Appearance: She is not ill-appearing.  HENT:     Head: Atraumatic.  Eyes:     Conjunctiva/sclera: Conjunctivae normal.  Cardiovascular:     Rate and Rhythm: Normal rate and regular rhythm.     Pulses: Normal pulses.     Heart sounds: No murmur heard. Pulmonary:     Effort: Pulmonary effort is normal. No respiratory distress.     Breath sounds: Normal breath sounds.  Abdominal:     General: Abdomen is flat. There is no distension.     Palpations: Abdomen is soft.     Tenderness: There is abdominal tenderness in the left lower quadrant. There is no right CVA tenderness or  left CVA tenderness.  Musculoskeletal:        General: Normal range of motion.     Cervical back: Normal range of motion.  Skin:    General: Skin is warm and dry.     Capillary Refill: Capillary refill takes less than 2 seconds.  Neurological:     General: No focal deficit present.     Mental Status: She is alert.  Psychiatric:        Mood and Affect: Mood normal.    ED Results / Procedures / Treatments   Labs (all labs ordered are listed, but only abnormal results are displayed) Labs Reviewed  URINALYSIS, ROUTINE W REFLEX MICROSCOPIC - Abnormal; Notable for the following components:      Result Value   Color, Urine AMBER (*)    APPearance CLOUDY (*)    Hgb urine dipstick LARGE (*)    Protein, ur >=300 (*)    RBC / HPF >50 (*)    WBC, UA >50 (*)    Bacteria, UA FEW (*)    All other components within normal limits  COMPREHENSIVE METABOLIC PANEL - Abnormal; Notable for the following components:   Sodium 133 (*)    Potassium 3.4 (*)    Glucose, Bld 130 (*)    Calcium 8.8 (*)    All other components within normal limits  CBC - Abnormal; Notable for the following components:   WBC 14.0 (*)    All other components within normal limits  URINE CULTURE  LIPASE, BLOOD  I-STAT BETA HCG BLOOD, ED ([facility], [facility], [facility] ONLY)  I-STAT BETA HCG BLOOD, ED ([facility], [facility], [facility] ONLY)    EKG None  Radiology CT Renal Stone Study  Result Date: 10/25/2021 CLINICAL DATA:  Nephrolithiasis.  Left abdominal pain. EXAM: CT ABDOMEN AND PELVIS WITHOUT CONTRAST TECHNIQUE: Multidetector CT imaging of the abdomen and pelvis was performed following the standard protocol without IV contrast. RADIATION DOSE REDUCTION: This exam was performed according to the departmental dose-optimization program which includes automated exposure control, adjustment of the mA and/or kV according to patient size and/or use of iterative reconstruction technique. COMPARISON:  10/05/2021 FINDINGS: Lower chest:  No contributory findings.  Hepatobiliary: No focal liver abnormality.Cholecystectomy. No bile duct dilatation. Pancreas: Unremarkable. Spleen: Unremarkable. Adrenals/Urinary Tract: Negative adrenals. Left hydroureteronephrosis and low-density renal expansion due to a 3 mm stone at the distal left ureter. Bilateral nephrolithiasis, at least 4 on the left and 3 on the right. The intrarenal calculi all measure 3 mm or less. Collapsed bladder with mild left-sided stranding near the offending calculi. Stomach/Bowel:  No obstruction. No appendicitis. Vascular/Lymphatic: No acute vascular abnormality. No mass or adenopathy. Reproductive:No pathologic findings. Other: No ascites or pneumoperitoneum. Musculoskeletal: No acute abnormalities. IMPRESSION: 1. Obstructing 3 mm stone in the distal left  ureter. 2. Small bilateral renal calculi. Electronically Signed   By: Jorje Guild M.D.   On: 10/25/2021 07:39    Procedures Procedures   Medications Ordered in ED Medications  lactated ringers bolus 1,000 mL (1,000 mLs Intravenous New Bag/Given 10/25/21 0758)  ondansetron (ZOFRAN) injection 4 mg (4 mg Intravenous Given 10/25/21 0759)  morphine (PF) 4 MG/ML injection 4 mg (4 mg Intravenous Given 10/25/21 0816)    ED Course/ Medical Decision Making/ A&P                           Medical Decision Making Amount and/or Complexity of Data Reviewed Labs: ordered. Radiology: ordered.  Risk Prescription drug management.   History:  Per HPI Social determinants of health: Patient is health insurance and is self-pay  Initial impression:  This patient presents to the ED for concern of left-sided flank pain and decreased urinary output, this involves an extensive number of treatment options, and is a complaint that carries with it a high risk of complications and morbidity.    36 year old female in no acute distress, nontoxic-appearing.  She does seem slightly uncomfortable due to left-sided flank/right lower quadrant pain.  Negative CVA  tenderness bilaterally.  Will obtain urinalysis and repeat CT renal stone study.   Lab Tests and EKG:  I Ordered, reviewed, and interpreted labs and EKG.  The pertinent results include:  CBC with leukocytosis of 14 Metabolic panel without evidence of AKI UA with white blood cells and red blood cells without evidence of infection.  There is some bacteria, however there were abundant squamous cells indicating contamination.   Imaging Studies ordered:  I ordered imaging studies including  CT renal stone study with 3 mm obstructing stone in the left distal ureter I independently visualized and interpreted imaging and I agree with the radiologist interpretation.    Cardiac Monitoring:  The patient was maintained on a cardiac monitor.  I personally viewed and interpreted the cardiac monitored which showed an underlying rhythm of: NSR   Medicines ordered and prescription drug management:  I ordered medication including: 1 L lactated ringer bolus for hydration Zofran 4 mg IV Morphine 4 mg IV for pain Reevaluation of the patient after these medicines showed that the patient improved I have reviewed the patients home medicines and have made adjustments as needed  Consultations Obtained:  I requested consultation with urology and spoke with Dr. Gloriann Loan and discussed lab and imaging findings as well as pertinent plan - they recommend: Start patient on Bactrim for her elevated white blood cell count.  Also ensure she has plenty of Flomax and pain medication.  They suspect given the location and size of the stone that this will pass on its own, likely today.  They also recommended I advised patient that should she become symptomatic again, she should call the urology clinic for recommendations as she may be able to avoid future ER visits.  Although she should return to the ED if she develops a fever.   Disposition:  After consideration of the diagnostic results, physical exam, history and the  patients response to treatment feel that the patent would benefit from discharge with outpatient follow up.   Nephrolithiasis: Patient sent home with Bactrim and a refill of her Percocet as she only had 2 remaining.  Recommendations and conversation with urologist discussed with her.  She is to follow-up outpatient.  All questions were asked and answered and she was discharged home in good  condition.  Return precautions were discussed    Final Clinical Impression(s) / ED Diagnoses Final diagnoses:  Nephrolithiasis    Rx / DC Orders ED Discharge Orders          Ordered    sulfamethoxazole-trimethoprim (BACTRIM DS) 800-160 MG tablet  2 times daily        10/25/21 0821    oxyCODONE-acetaminophen (PERCOCET/ROXICET) 5-325 MG tablet  Every 6 hours PRN        10/25/21 0821              Tonye Pearson, PA-C 10/25/21 [id number]    Lucrezia Starch, MD 10/25/21 1521

## 2021-10-25 NOTE — ED Triage Notes (Signed)
Pt reports with left sided abdominal pain. She states that she had a kidney stone on Thursday and a stent was removed yesterday. Pt reports being in severe pain since then.  ?

## 2021-10-26 LAB — URINE CULTURE: Culture: NO GROWTH

## 2024-02-20 ENCOUNTER — Emergency Department (HOSPITAL_COMMUNITY)
Admission: EM | Admit: 2024-02-20 | Discharge: 2024-02-21 | Disposition: A | Discharge status: Home / Self Care | Payer: Self-pay

## 2024-02-20 ENCOUNTER — Other Ambulatory Visit: Payer: Self-pay

## 2024-02-20 ENCOUNTER — Encounter (HOSPITAL_COMMUNITY): Payer: Self-pay

## 2024-02-20 ENCOUNTER — Emergency Department (HOSPITAL_COMMUNITY): Payer: Self-pay

## 2024-02-20 DIAGNOSIS — E871 Hypo-osmolality and hyponatremia: Secondary | ICD-10-CM | POA: Insufficient documentation

## 2024-02-20 DIAGNOSIS — D72829 Elevated white blood cell count, unspecified: Secondary | ICD-10-CM | POA: Insufficient documentation

## 2024-02-20 DIAGNOSIS — D649 Anemia, unspecified: Secondary | ICD-10-CM | POA: Insufficient documentation

## 2024-02-20 DIAGNOSIS — R000 Tachycardia, unspecified: Secondary | ICD-10-CM | POA: Insufficient documentation

## 2024-02-20 DIAGNOSIS — L0291 Cutaneous abscess, unspecified: Secondary | ICD-10-CM

## 2024-02-20 DIAGNOSIS — D75839 Thrombocytosis, unspecified: Secondary | ICD-10-CM | POA: Insufficient documentation

## 2024-02-20 DIAGNOSIS — Y828 Other medical devices associated with adverse incidents: Secondary | ICD-10-CM | POA: Insufficient documentation

## 2024-02-20 DIAGNOSIS — R7309 Other abnormal glucose: Secondary | ICD-10-CM | POA: Insufficient documentation

## 2024-02-20 DIAGNOSIS — T8140XA Infection following a procedure, unspecified, initial encounter: Secondary | ICD-10-CM | POA: Insufficient documentation

## 2024-02-20 LAB — CBC WITH DIFFERENTIAL/PLATELET
Abs Immature Granulocytes: 0.06 10*3/uL (ref 0.00–0.07)
Basophils Absolute: 0.1 10*3/uL (ref 0.0–0.1)
Basophils Relative: 0 %
Eosinophils Absolute: 0.2 10*3/uL (ref 0.0–0.5)
Eosinophils Relative: 1 %
HCT: 32 % — ABNORMAL LOW (ref 36.0–46.0)
Hemoglobin: 10.8 g/dL — ABNORMAL LOW (ref 12.0–15.0)
Immature Granulocytes: 0 %
Lymphocytes Relative: 6 %
Lymphs Abs: 1 10*3/uL (ref 0.7–4.0)
MCH: 31.3 pg (ref 26.0–34.0)
MCHC: 33.8 g/dL (ref 30.0–36.0)
MCV: 92.8 fL (ref 80.0–100.0)
Monocytes Absolute: 1 10*3/uL (ref 0.1–1.0)
Monocytes Relative: 6 %
Neutro Abs: 14.3 10*3/uL — ABNORMAL HIGH (ref 1.7–7.7)
Neutrophils Relative %: 87 %
Platelets: 500 10*3/uL — ABNORMAL HIGH (ref 150–400)
RBC: 3.45 MIL/uL — ABNORMAL LOW (ref 3.87–5.11)
RDW: 11.9 % (ref 11.5–15.5)
WBC: 16.6 10*3/uL — ABNORMAL HIGH (ref 4.0–10.5)
nRBC: 0 % (ref 0.0–0.2)

## 2024-02-20 LAB — COMPREHENSIVE METABOLIC PANEL WITH GFR
ALT: 16 U/L (ref 0–44)
AST: 17 U/L (ref 15–41)
Albumin: 3.3 g/dL — ABNORMAL LOW (ref 3.5–5.0)
Alkaline Phosphatase: 62 U/L (ref 38–126)
Anion gap: 12 (ref 5–15)
BUN: 10 mg/dL (ref 6–20)
CO2: 23 mmol/L (ref 22–32)
Calcium: 8.8 mg/dL — ABNORMAL LOW (ref 8.9–10.3)
Chloride: 98 mmol/L (ref 98–111)
Creatinine, Ser: 0.61 mg/dL (ref 0.44–1.00)
GFR, Estimated: >60 mL/min (ref >60)
Glucose, Bld: 126 mg/dL — ABNORMAL HIGH (ref 70–99)
Potassium: 3.5 mmol/L (ref 3.5–5.1)
Sodium: 133 mmol/L — ABNORMAL LOW (ref 135–145)
Total Bilirubin: 0.6 mg/dL (ref 0.0–1.2)
Total Protein: 6.8 g/dL (ref 6.5–8.1)

## 2024-02-20 LAB — HCG, SERUM, QUALITATIVE: Preg, Serum: NEGATIVE

## 2024-02-20 LAB — I-STAT CG4 LACTIC ACID, ED: Lactic Acid, Venous: 1.4 mmol/L (ref 0.5–1.9)

## 2024-02-20 MED ORDER — SODIUM CHLORIDE 0.9 % IV SOLN
2.0000 g | Freq: Once | INTRAVENOUS | Status: AC
  Administered 2024-02-20: 2 g via INTRAVENOUS
  Filled 2024-02-20: qty 12.5

## 2024-02-20 MED ORDER — ACETAMINOPHEN 10 MG/ML IV SOLN
1000.0000 mg | Freq: Four times a day (QID) | INTRAVENOUS | Status: DC
  Administered 2024-02-21: 1000 mg via INTRAVENOUS
  Filled 2024-02-20: qty 100

## 2024-02-20 MED ORDER — SODIUM CHLORIDE 0.9 % IV BOLUS
1000.0000 mL | Freq: Once | INTRAVENOUS | Status: AC
  Administered 2024-02-20: 1000 mL via INTRAVENOUS

## 2024-02-20 MED ORDER — VANCOMYCIN HCL IN DEXTROSE 1-5 GM/200ML-% IV SOLN
1000.0000 mg | Freq: Once | INTRAVENOUS | Status: AC
  Administered 2024-02-20: 1000 mg via INTRAVENOUS
  Filled 2024-02-20: qty 200

## 2024-02-20 MED ORDER — FENTANYL CITRATE PF 50 MCG/ML IJ SOSY
50.0000 ug | PREFILLED_SYRINGE | Freq: Once | INTRAMUSCULAR | Status: AC
  Administered 2024-02-20: 50 ug via INTRAVENOUS
  Filled 2024-02-20: qty 1

## 2024-02-20 MED ORDER — SODIUM CHLORIDE 0.9 % IV BOLUS
1500.0000 mL | Freq: Once | INTRAVENOUS | Status: AC
  Administered 2024-02-20: 1500 mL via INTRAVENOUS

## 2024-02-20 MED ORDER — IOHEXOL 300 MG/ML  SOLN
100.0000 mL | Freq: Once | INTRAMUSCULAR | Status: AC | PRN
  Administered 2024-02-20: 100 mL via INTRAVENOUS

## 2024-02-20 NOTE — ED Triage Notes (Signed)
 Pt arrives with fever after having a BBL in [place]. Pt endorses,dizziness,h/a, fever, chills, nausea that started on Sunday.Denies vomiting or SOB. Had surgical site drained on Monday and site has gotten worse per Pt.

## 2024-02-20 NOTE — ED Provider Notes (Signed)
 [place] EMERGENCY DEPARTMENT AT [facility] Provider Note   CSN: [id number] Arrival date & time: 02/20/24  1952     Patient presents with: Fever   [name] Bass is a 38 y.o. female.   Patient to ED complains of right buttock pain, redness and swelling associated with fever, feeling unwell, lightheadedness, nausea preventing her from eating/drinking. Symptoms started 4 days ago. She reports having a [place] butt lift on 6/17 in [place]. She did well until 4 days ago when symptoms started. She was seen Monday by her surgeon who, patient reports, drained an area to the right buttock and started her on Bactrim . She reports symptoms worsening since that time, now with reddened area to the abdomen that is painful.   The history is provided by the patient. No language interpreter was used.  Fever      Prior to Admission medications   Medication Sig Start Date End Date Taking? Authorizing Provider  acetaminophen  (TYLENOL ) 500 MG tablet Take 1,500 mg by mouth every 6 (six) hours as needed for mild pain, moderate pain or headache.     [provider]  Cholecalciferol (VITAMIN D3) 50 MCG (2000 UT) TABS Take 2,000 Units by mouth every other day.    [provider]  Multiple Vitamins-Minerals (MULTIVITAMIN WITH MINERALS) tablet Take 1 tablet by mouth daily.    [provider]  naproxen  (NAPROSYN ) 500 MG tablet Take 1 tablet (500 mg total) by mouth 2 (two) times daily as needed for moderate pain. 01/22/20   Schuyler Charlie RAMAN, MD  NIFEdipine  (PROCARDIA -XL/NIFEDICAL-XL) 30 MG 24 hr tablet Take 30 mg by mouth every evening. Pt takes in the pm 12/23/19   [provider]  ondansetron  (ZOFRAN ) 4 MG tablet Take 1 tablet (4 mg total) by mouth every 6 (six) hours. 10/05/21   Long, Joshua G, MD  oxyCODONE -acetaminophen  (PERCOCET/ROXICET) 5-325 MG tablet Take 1 tablet by mouth every 6 (six) hours as needed for severe pain. 10/25/21   Conklin, Erica R, PA-C   phenazopyridine  (PYRIDIUM ) 200 MG tablet Take 1 tablet (200 mg total) by mouth 3 (three) times daily as needed for pain. 10/20/21   Cam Morene ORN, MD  senna-docusate (SENOKOT-S) 8.6-50 MG tablet Take 1 tablet by mouth at bedtime as needed for mild constipation or moderate constipation. 10/05/21   Long, Fonda MATSU, MD  traMADol  (ULTRAM ) 50 MG tablet Take 1-2 tablets (50-100 mg total) by mouth every 6 (six) hours as needed for moderate pain. 10/20/21   Cam Morene ORN, MD    Allergies: Amoxicillin and Egg-derived products    Review of Systems  Constitutional:  Positive for fever.    Updated Vital Signs BP 138/71 (BP Location: Left Arm)   Pulse (!) 110   Temp (!) 100.5 F (38.1 C) (Oral)   Resp 18   Ht 5' 10 (1.778 m)   Wt 79.4 kg   SpO2 99%   BMI 25.11 kg/m   Physical Exam Vitals and nursing note reviewed.  Cardiovascular:     Rate and Rhythm: Tachycardia present.  Pulmonary:     Effort: Pulmonary effort is normal.  Abdominal:     Comments: There is a shallow ulceration in lower central abdomen without drainage. The abdomen is guarded, generally tender. There is erythema predominant across the lower abdomen that is warm to touch.   Musculoskeletal:     Comments: Right lateral upper buttock red, warm to touch. No fluctuance. Residual ecchy  Skin:    Findings: Erythema present.  Neurological:     Mental Status: She is alert and oriented to person, place, and time.     (all labs ordered are listed, but only abnormal results are displayed) Labs Reviewed  CBC WITH DIFFERENTIAL/PLATELET - Abnormal; Notable for the following components:      Result Value   WBC 16.6 (*)    RBC 3.45 (*)    Hemoglobin 10.8 (*)    HCT 32.0 (*)    Platelets 500 (*)    Neutro Abs 14.3 (*)    All other components within normal limits  COMPREHENSIVE METABOLIC PANEL WITH GFR - Abnormal; Notable for the following components:   Sodium 133 (*)    Glucose, Bld 126 (*)    Calcium  8.8 (*)     Albumin 3.3 (*)    All other components within normal limits  CULTURE, BLOOD (ROUTINE X 2)  CULTURE, BLOOD (ROUTINE X 2)  HCG, SERUM, QUALITATIVE  URINALYSIS, ROUTINE W REFLEX MICROSCOPIC  I-STAT CG4 LACTIC ACID, ED  I-STAT CG4 LACTIC ACID, ED   Results for orders placed or performed during the hospital encounter of 02/20/24  CBC with Differential   Collection Time: 02/20/24  9:09 PM  Result Value Ref Range   WBC 16.6 (H) 4.0 - 10.5 K/uL   RBC 3.45 (L) 3.87 - 5.11 MIL/uL   Hemoglobin 10.8 (L) 12.0 - 15.0 g/dL   HCT 67.9 (L) 63.9 - 53.9 %   MCV 92.8 80.0 - 100.0 fL   MCH 31.3 26.0 - 34.0 pg   MCHC 33.8 30.0 - 36.0 g/dL   RDW 88.0 88.4 - 84.4 %   Platelets 500 (H) 150 - 400 K/uL   nRBC 0.0 0.0 - 0.2 %   Neutrophils Relative % 87 %   Neutro Abs 14.3 (H) 1.7 - 7.7 K/uL   Lymphocytes Relative 6 %   Lymphs Abs 1.0 0.7 - 4.0 K/uL   Monocytes Relative 6 %   Monocytes Absolute 1.0 0.1 - 1.0 K/uL   Eosinophils Relative 1 %   Eosinophils Absolute 0.2 0.0 - 0.5 K/uL   Basophils Relative 0 %   Basophils Absolute 0.1 0.0 - 0.1 K/uL   Immature Granulocytes 0 %   Abs Immature Granulocytes 0.06 0.00 - 0.07 K/uL  Comprehensive metabolic panel   Collection Time: 02/20/24  9:09 PM  Result Value Ref Range   Sodium 133 (L) 135 - 145 mmol/L   Potassium 3.5 3.5 - 5.1 mmol/L   Chloride 98 98 - 111 mmol/L   CO2 23 22 - 32 mmol/L   Glucose, Bld 126 (H) 70 - 99 mg/dL   BUN 10 6 - 20 mg/dL   Creatinine, Ser 9.38 0.44 - 1.00 mg/dL   Calcium  8.8 (L) 8.9 - 10.3 mg/dL   Total Protein 6.8 6.5 - 8.1 g/dL   Albumin 3.3 (L) 3.5 - 5.0 g/dL   AST 17 15 - 41 U/L   ALT 16 0 - 44 U/L   Alkaline Phosphatase 62 38 - 126 U/L   Total Bilirubin 0.6 0.0 - 1.2 mg/dL   GFR, Estimated >39 >39 mL/min   Anion gap 12 5 - 15  hCG, serum, qualitative   Collection Time: 02/20/24  9:09 PM  Result Value Ref Range   Preg, Serum NEGATIVE NEGATIVE  I-Stat Lactic Acid   Collection Time: 02/20/24  9:18 PM  Result Value  Ref Range   Lactic Acid, Venous 1.4 0.5 - 1.9 mmol/L     EKG: None  Radiology: No  results found.   Procedures   Medications Ordered in the ED  vancomycin (VANCOCIN) IVPB 1000 mg/200 mL premix (1,000 mg Intravenous New Bag/Given 02/20/24 2207)  fentaNYL  (SUBLIMAZE ) injection 50 mcg (50 mcg Intravenous Given 02/20/24 2141)  sodium chloride  0.9 % bolus 1,000 mL (1,000 mLs Intravenous New Bag/Given 02/20/24 2139)  ceFEPIme (MAXIPIME) 2 g in sodium chloride  0.9 % 100 mL IVPB (0 g Intravenous Stopped 02/20/24 2204)  sodium chloride  0.9 % bolus 1,500 mL (1,500 mLs Intravenous New Bag/Given 02/20/24 2205)    Clinical Course as of 02/20/24 2222  Wed Feb 20, 2024  2221 [facility] surgeon, Dr. Aisha, [phone].  [SU]    Clinical Course User Index [SU] Odell Balls, PA-C                                 Medical Decision Making This patient presents to the ED for concern of post-op infection, this involves an extensive number of treatment options, and is a complaint that carries with it a high risk of complications and morbidity.    Co morbidities that complicate the patient evaluation  HTN   Additional history obtained:  Additional history and/or information obtained from chart review, notable for Discussed the patient's presentation with surgeon who performed the surgery in [facility], Dr. Aisha.   Lab Tests:  I Ordered, and personally interpreted labs.  The pertinent results include:   CBC: WBC 16.6, hgb 11.8, plts 500 Cmet: Na 133, glu 126 Pregnancy negative Lactic acid 1.4 Blood cultures pending Urine cultures pending.  UA - needs collection   Imaging Studies ordered:  I ordered imaging studies including ct abd/pel I independently visualized and interpreted imaging which showed pending at the time of sign out to oncoming provider   Cardiac Monitoring:  The patient was maintained on a cardiac monitor.  I personally viewed and interpreted the cardiac monitored which  showed an underlying rhythm of: na   Medicines ordered and prescription drug management:  I ordered medication including fentanyl   for pain Reevaluation of the patient after these medicines showed that the patient improved I have reviewed the patients home medicines and have made adjustments as needed   Test Considered:  Na/   Critical Interventions:  N/a   Consultations Obtained:  I requested consultation with the na/,  and discussed lab and imaging findings as well as pertinent plan - they recommend: na   Problem List / ED Course:  Here with post-op infection after liposuction and butt lift 6/17 Fever, buttock pain, now abdominal pain x 4 days Spoke with Dr. Aisha who reports he drained a small amount of purulent material via ultrasound 2 days ago. Started Abx. REcheck appointment was for tomorrow morning.   Broad spectrum abx started. Pain managed CT abd/pel pending Anticipate admission   Reevaluation:  After the interventions noted above, I reevaluated the patient and found that they have :pending reassessment   Social Determinants of Health:  Former smoker   Disposition:  After consideration of the diagnostic results and the patients response to treatment, I feel that the patient would benefit from anticipate admission.   Amount and/or Complexity of Data Reviewed Labs: ordered. Radiology: ordered.  Risk Prescription drug management.        Final diagnoses:  Postoperative infection, unspecified type, initial encounter    ED Discharge Orders     None          Odell Balls, DEVONNA 02/20/24  2222    Gennaro Bouchard L, DO 02/21/24 1836

## 2024-02-20 NOTE — ED Provider Notes (Signed)
 Patient signed out to me at shift change.  Here meeting SIRS criteria with suspected cellulitis and possible abscess to abdomen and buttock after [place] butt lift in [place].  [facility], Dr. Aisha, [phone].   I spoke with Dr. Aisha following CT result.  He recommends aspiration.  Recommends incision for Penrose drain or iodoform gauze if possible.  Patient seen by and discussed with Dr. Bari, we both attempted incision and drainage and we successfully evacuated 10 cc of purulent discharge.  The pocket of pus was approximately 6 cm deep, and felt to be too deep for further emergency department dissection with scalpel.  In speaking with the patient's surgeon, he said that he could see her in the clinic tomorrow.  I feel that this would be the best plan for the patient.  Patient seen by and discussed with Dr. Bari, who agrees.     Vicky Charleston, PA-C 02/21/24 0136    Bari Charmaine FALCON, MD 02/21/24 [phone]

## 2024-02-20 NOTE — ED Notes (Signed)
 Unable to obtain second set of cultures due to hard IV stick. Will continue sepsis protocol. MD notified

## 2024-02-21 ENCOUNTER — Emergency Department (HOSPITAL_COMMUNITY): Payer: MEDICAID

## 2024-02-21 ENCOUNTER — Encounter (HOSPITAL_COMMUNITY): Payer: Self-pay

## 2024-02-21 ENCOUNTER — Other Ambulatory Visit: Payer: Self-pay

## 2024-02-21 ENCOUNTER — Inpatient Hospital Stay (HOSPITAL_COMMUNITY)
Admission: EM | Admit: 2024-02-21 | Discharge: 2024-02-25 | DRG: 863 | Disposition: A | Discharge status: Home / Self Care | Payer: MEDICAID | Attending: Internal Medicine | Admitting: Internal Medicine

## 2024-02-21 DIAGNOSIS — Z1152 Encounter for screening for COVID-19: Secondary | ICD-10-CM

## 2024-02-21 DIAGNOSIS — Z8041 Family history of malignant neoplasm of ovary: Secondary | ICD-10-CM

## 2024-02-21 DIAGNOSIS — Y848 Other medical procedures as the cause of abnormal reaction of the patient, or of later complication, without mention of misadventure at the time of the procedure: Secondary | ICD-10-CM | POA: Diagnosis present

## 2024-02-21 DIAGNOSIS — I10 Essential (primary) hypertension: Secondary | ICD-10-CM | POA: Diagnosis present

## 2024-02-21 DIAGNOSIS — L0231 Cutaneous abscess of buttock: Secondary | ICD-10-CM | POA: Diagnosis present

## 2024-02-21 DIAGNOSIS — Z91012 Allergy to eggs: Secondary | ICD-10-CM

## 2024-02-21 DIAGNOSIS — Z880 Allergy status to penicillin: Secondary | ICD-10-CM

## 2024-02-21 DIAGNOSIS — T819XXS Unspecified complication of procedure, sequela: Secondary | ICD-10-CM

## 2024-02-21 DIAGNOSIS — Z8249 Family history of ischemic heart disease and other diseases of the circulatory system: Secondary | ICD-10-CM

## 2024-02-21 DIAGNOSIS — D509 Iron deficiency anemia, unspecified: Secondary | ICD-10-CM | POA: Diagnosis present

## 2024-02-21 DIAGNOSIS — Z87891 Personal history of nicotine dependence: Secondary | ICD-10-CM

## 2024-02-21 DIAGNOSIS — Z833 Family history of diabetes mellitus: Secondary | ICD-10-CM

## 2024-02-21 DIAGNOSIS — T819XXA Unspecified complication of procedure, initial encounter: Secondary | ICD-10-CM | POA: Diagnosis present

## 2024-02-21 DIAGNOSIS — G8918 Other acute postprocedural pain: Secondary | ICD-10-CM

## 2024-02-21 DIAGNOSIS — Z9049 Acquired absence of other specified parts of digestive tract: Secondary | ICD-10-CM

## 2024-02-21 DIAGNOSIS — A419 Sepsis, unspecified organism: Principal | ICD-10-CM

## 2024-02-21 DIAGNOSIS — T8141XA Infection following a procedure, superficial incisional surgical site, initial encounter: Principal | ICD-10-CM | POA: Diagnosis present

## 2024-02-21 DIAGNOSIS — J982 Interstitial emphysema: Secondary | ICD-10-CM | POA: Diagnosis present

## 2024-02-21 LAB — COMPREHENSIVE METABOLIC PANEL WITH GFR
ALT: 20 U/L (ref 0–44)
AST: 16 U/L (ref 15–41)
Albumin: 3.1 g/dL — ABNORMAL LOW (ref 3.5–5.0)
Alkaline Phosphatase: 75 U/L (ref 38–126)
Anion gap: 9 (ref 5–15)
BUN: 10 mg/dL (ref 6–20)
CO2: 21 mmol/L — ABNORMAL LOW (ref 22–32)
Calcium: 8.6 mg/dL — ABNORMAL LOW (ref 8.9–10.3)
Chloride: 105 mmol/L (ref 98–111)
Creatinine, Ser: 0.54 mg/dL (ref 0.44–1.00)
GFR, Estimated: >60 mL/min (ref >60)
Glucose, Bld: 119 mg/dL — ABNORMAL HIGH (ref 70–99)
Potassium: 3.6 mmol/L (ref 3.5–5.1)
Sodium: 135 mmol/L (ref 135–145)
Total Bilirubin: 0.7 mg/dL (ref 0.0–1.2)
Total Protein: 6.3 g/dL — ABNORMAL LOW (ref 6.5–8.1)

## 2024-02-21 LAB — CBC WITH DIFFERENTIAL/PLATELET
Abs Immature Granulocytes: 0.07 10*3/uL (ref 0.00–0.07)
Basophils Absolute: 0.1 10*3/uL (ref 0.0–0.1)
Basophils Relative: 1 %
Eosinophils Absolute: 0.3 10*3/uL (ref 0.0–0.5)
Eosinophils Relative: 1 %
HCT: 29.5 % — ABNORMAL LOW (ref 36.0–46.0)
Hemoglobin: 10 g/dL — ABNORMAL LOW (ref 12.0–15.0)
Immature Granulocytes: 0 %
Lymphocytes Relative: 5 %
Lymphs Abs: 1 10*3/uL (ref 0.7–4.0)
MCH: 31.5 pg (ref 26.0–34.0)
MCHC: 33.9 g/dL (ref 30.0–36.0)
MCV: 93.1 fL (ref 80.0–100.0)
Monocytes Absolute: 1.2 10*3/uL — ABNORMAL HIGH (ref 0.1–1.0)
Monocytes Relative: 6 %
Neutro Abs: 16.8 10*3/uL — ABNORMAL HIGH (ref 1.7–7.7)
Neutrophils Relative %: 87 %
Platelets: 431 10*3/uL — ABNORMAL HIGH (ref 150–400)
RBC: 3.17 MIL/uL — ABNORMAL LOW (ref 3.87–5.11)
RDW: 11.9 % (ref 11.5–15.5)
WBC: 19.5 10*3/uL — ABNORMAL HIGH (ref 4.0–10.5)
nRBC: 0 % (ref 0.0–0.2)

## 2024-02-21 LAB — I-STAT CG4 LACTIC ACID, ED: Lactic Acid, Venous: 0.8 mmol/L (ref 0.5–1.9)

## 2024-02-21 LAB — HCG, SERUM, QUALITATIVE: Preg, Serum: NEGATIVE

## 2024-02-21 MED ORDER — CEFAZOLIN SODIUM-DEXTROSE 2-4 GM/100ML-% IV SOLN: 2.0000 g | INTRAVENOUS | Status: DC

## 2024-02-21 MED ORDER — VANCOMYCIN HCL IN DEXTROSE 1-5 GM/200ML-% IV SOLN
1000.0000 mg | Freq: Once | INTRAVENOUS | Status: AC
Start: 2024-02-21 — End: 2024-02-22
  Administered 2024-02-21: 1000 mg via INTRAVENOUS
  Filled 2024-02-21 (×2): qty 200

## 2024-02-21 MED ORDER — ONDANSETRON HCL 4 MG/2ML IJ SOLN
4.0000 mg | Freq: Four times a day (QID) | INTRAMUSCULAR | Status: DC | PRN
  Administered 2024-02-22: 4 mg via INTRAVENOUS

## 2024-02-21 MED ORDER — ACETAMINOPHEN 500 MG PO TABS: 1000.0000 mg | ORAL_TABLET | ORAL | Status: DC

## 2024-02-21 MED ORDER — PHENOL 1.4 % MT LIQD: 2.0000 | OROMUCOSAL | Status: DC | PRN

## 2024-02-21 MED ORDER — LORAZEPAM 0.5 MG PO TABS: 0.5000 mg | ORAL_TABLET | Freq: Four times a day (QID) | ORAL | Status: DC | PRN

## 2024-02-21 MED ORDER — GABAPENTIN 300 MG PO CAPS
300.0000 mg | ORAL_CAPSULE | ORAL | Status: AC
  Administered 2024-02-22: 300 mg via ORAL

## 2024-02-21 MED ORDER — CHLORHEXIDINE GLUCONATE CLOTH 2 % EX PADS
6.0000 | MEDICATED_PAD | Freq: Once | CUTANEOUS | Status: AC
  Administered 2024-02-22: 6 via TOPICAL

## 2024-02-21 MED ORDER — METHOCARBAMOL 500 MG PO TABS
1000.0000 mg | ORAL_TABLET | Freq: Four times a day (QID) | ORAL | Status: DC | PRN
  Filled 2024-02-21: qty 2

## 2024-02-21 MED ORDER — DIPHENHYDRAMINE HCL 50 MG/ML IJ SOLN: 12.5000 mg | Freq: Four times a day (QID) | INTRAMUSCULAR | Status: DC | PRN

## 2024-02-21 MED ORDER — NAPHAZOLINE-GLYCERIN 0.012-0.25 % OP SOLN: 1.0000 [drp] | Freq: Four times a day (QID) | OPHTHALMIC | Status: DC | PRN

## 2024-02-21 MED ORDER — LACTATED RINGERS IV SOLN: INTRAVENOUS | Status: AC

## 2024-02-21 MED ORDER — LACTATED RINGERS IV BOLUS: 1000.0000 mL | Freq: Three times a day (TID) | INTRAVENOUS | Status: AC | PRN

## 2024-02-21 MED ORDER — ONDANSETRON HCL 4 MG/2ML IJ SOLN
4.0000 mg | Freq: Once | INTRAMUSCULAR | Status: AC
  Administered 2024-02-21: 4 mg via INTRAVENOUS
  Filled 2024-02-21: qty 2

## 2024-02-21 MED ORDER — HYDROMORPHONE HCL 1 MG/ML IJ SOLN
0.5000 mg | INTRAMUSCULAR | Status: DC | PRN
  Administered 2024-02-22 – 2024-02-24 (×4): 1 mg via INTRAVENOUS
  Filled 2024-02-21 (×6): qty 1

## 2024-02-21 MED ORDER — ACETAMINOPHEN 500 MG PO TABS
1000.0000 mg | ORAL_TABLET | Freq: Four times a day (QID) | ORAL | Status: DC
  Administered 2024-02-22 – 2024-02-24 (×10): 1000 mg via ORAL
  Filled 2024-02-21 (×12): qty 2

## 2024-02-21 MED ORDER — METOPROLOL TARTRATE 5 MG/5ML IV SOLN: 5.0000 mg | Freq: Four times a day (QID) | INTRAVENOUS | Status: DC | PRN

## 2024-02-21 MED ORDER — ALUM & MAG HYDROXIDE-SIMETH 200-200-20 MG/5ML PO SUSP: 30.0000 mL | Freq: Four times a day (QID) | ORAL | Status: DC | PRN

## 2024-02-21 MED ORDER — CEFAZOLIN SODIUM-DEXTROSE 2-4 GM/100ML-% IV SOLN
2.0000 g | Freq: Three times a day (TID) | INTRAVENOUS | Status: DC
  Administered 2024-02-22 – 2024-02-25 (×10): 2 g via INTRAVENOUS
  Filled 2024-02-21 (×11): qty 100

## 2024-02-21 MED ORDER — SALINE SPRAY 0.65 % NA SOLN: 1.0000 | Freq: Four times a day (QID) | NASAL | Status: DC | PRN

## 2024-02-21 MED ORDER — MAGIC MOUTHWASH: 15.0000 mL | Freq: Four times a day (QID) | ORAL | Status: DC | PRN

## 2024-02-21 MED ORDER — LACTATED RINGERS IV BOLUS (SEPSIS)
500.0000 mL | Freq: Once | INTRAVENOUS | Status: AC
  Administered 2024-02-22: 500 mL via INTRAVENOUS

## 2024-02-21 MED ORDER — ACETAMINOPHEN 500 MG PO TABS
1000.0000 mg | ORAL_TABLET | Freq: Once | ORAL | Status: DC
  Filled 2024-02-21: qty 2

## 2024-02-21 MED ORDER — LACTATED RINGERS IV BOLUS: 1000.0000 mL | Freq: Once | INTRAVENOUS | Status: DC

## 2024-02-21 MED ORDER — SODIUM CHLORIDE 0.9 % IV SOLN
2.0000 g | Freq: Once | INTRAVENOUS | Status: DC
  Administered 2024-02-21: 2 g via INTRAVENOUS
  Filled 2024-02-21: qty 12.5

## 2024-02-21 MED ORDER — HYDROCODONE-ACETAMINOPHEN 5-325 MG PO TABS: 1.0000 | ORAL_TABLET | ORAL | Status: DC | PRN

## 2024-02-21 MED ORDER — SIMETHICONE 40 MG/0.6ML PO SUSP: 80.0000 mg | Freq: Four times a day (QID) | ORAL | Status: DC | PRN

## 2024-02-21 MED ORDER — LACTATED RINGERS IV BOLUS (SEPSIS)
1000.0000 mL | Freq: Once | INTRAVENOUS | Status: AC
  Administered 2024-02-22: 1000 mL via INTRAVENOUS

## 2024-02-21 MED ORDER — MENTHOL 3 MG MT LOZG: 1.0000 | LOZENGE | OROMUCOSAL | Status: DC | PRN

## 2024-02-21 MED ORDER — CHLORHEXIDINE GLUCONATE CLOTH 2 % EX PADS: 6.0000 | MEDICATED_PAD | Freq: Once | CUTANEOUS | Status: DC

## 2024-02-21 MED ORDER — LIDOCAINE-EPINEPHRINE (PF) 2 %-1:200000 IJ SOLN
10.0000 mL | Freq: Once | INTRAMUSCULAR | Status: AC
  Administered 2024-02-21: 10 mL
  Filled 2024-02-21: qty 20

## 2024-02-21 MED ORDER — CELECOXIB 200 MG PO CAPS
200.0000 mg | ORAL_CAPSULE | ORAL | Status: AC
  Administered 2024-02-22: 200 mg via ORAL

## 2024-02-21 MED ORDER — METRONIDAZOLE 500 MG/100ML IV SOLN
500.0000 mg | Freq: Two times a day (BID) | INTRAVENOUS | Status: DC
  Administered 2024-02-22 – 2024-02-24 (×5): 500 mg via INTRAVENOUS
  Filled 2024-02-21 (×5): qty 100

## 2024-02-21 MED ORDER — LACTATED RINGERS IV BOLUS (SEPSIS)
1000.0000 mL | Freq: Once | INTRAVENOUS | Status: AC
Start: 2024-02-21 — End: 2024-02-22
  Administered 2024-02-21: 1000 mL via INTRAVENOUS

## 2024-02-21 MED ORDER — METHOCARBAMOL 1000 MG/10ML IJ SOLN
1000.0000 mg | Freq: Four times a day (QID) | INTRAMUSCULAR | Status: DC | PRN
  Filled 2024-02-21: qty 10

## 2024-02-21 MED ORDER — MORPHINE SULFATE (PF) 4 MG/ML IV SOLN
4.0000 mg | Freq: Once | INTRAVENOUS | Status: AC
  Administered 2024-02-21: 4 mg via INTRAVENOUS
  Filled 2024-02-21: qty 1

## 2024-02-21 NOTE — H&P (Incomplete)
 History and Physical    [name] Bass FMW:[id number] DOB: [birth year] DOA: 02/21/2024  PCP: Patient, No Pcp Per   Patient coming from: Home   Chief Complaint:  Chief Complaint  Patient presents with  . Post-op Problem  . Fever   ED TRIAGE note:  HPI:  [name] Bass is a 38 y.o. female with medical history significant of Brazilian butt lift procedure postop day 16 presented to emergency for the second time to bed complaining of worsening fever, persistent pain. Patient had recent [place] butt lift procedure done by Dr. Aisha at outpatient surgical center in [place] [facility] 01/1719 25.  5 days ago patient had a pain started on the right buttock with associated fever.  She was seen by her surgeon reported aspirated the area of the right buttock and started on Bactrim  however presented to ED for the second time today for persistent pain.  In the ED yesterday patient found to have suspected cellulitis and questionable abscess of the right buttock confirmed on the CT imaging subsequently underwent aspiration around 10 cc purulent discharge and patient was sent home to follow-up with surgeon in the office 7/5. However further workup in the ED today revealed concern for abscess formation. ED physician initially spoke with patient's or general surgeon Dr. Aisha who stated that he does not have any hospital privilege to do any surgical procedure and recommended to speak with our inpatient surgeon for further evaluation.   Later on Dr. Garnette Schultze has been consulted..  Per general surgery no suspicion for Fournier's gangrene/necrotic fasciitis at this time.  Recommended IV antibiotic coverage and plan to take her OR in the morning plan for I&D.   At presentation to ED patient is tachycardic otherwise hemodynamically stable. CBC showing leukocytosis 19.5, stable H&H normal platelet count. CMP showing low bicarb 21, low albumin 3.1 otherwise unremarkable. Lactic acid within normal  range. Pregnancy test negative. EKG shows sinus tachycardia heart rate 128. Blood cultures are in process.  In the ED patient has been given cefazolin  for surgical prophylaxis and 1 L of LR bolus followed by continue LR 150 cc/h.  Lab Orders         Resp panel by RT-PCR (RSV, Flu A&B, Covid) Anterior Nasal Swab         Blood Culture (routine x 2)         MRSA Next Gen by PCR, Nasal         Comprehensive metabolic panel         CBC with Differential         hCG, serum, qualitative         Protime-INR         I-Stat Lactic Acid, ED       Review of Systems:  ROS  Past Medical History:  Diagnosis Date  . Abnormal Pap smear   . Bacterial vaginosis 04/29/04  . Chronic kidney disease    kidney stones  . CIN I (cervical intraepithelial neoplasia I)   . Dyspareunia 05/02/10  . Dysuria 04/04/05  . FHx: suicide   . H/O amenorrhea 02/19/04  . Herpes simplex type 2 infection    last outbreak 3 weeks ago  . Herpes simplex without mention of complication   . 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 History of kidney stones   . Hx: UTI (urinary tract infection)   . Hypertension   . Infection    UTI  . Irregular periods/menstrual cycles   . Polycystic ovarian syndrome 05/08/05  .  Vaginal Pap smear, abnormal   . Vaginitis and vulvovaginitis 04/29/04    Past Surgical History:  Procedure Laterality Date  . [employer]    . CHOLECYSTECTOMY, LAPAROSCOPIC  08/21/2008  . CYSTOSCOPY WITH RETROGRADE PYELOGRAM, URETEROSCOPY AND STENT PLACEMENT Bilateral 01/17/2020   Procedure: CYSTOSCOPY WITH RETROGRADE PYELOGRAM, URETEROSCOPY bilateral right double j stent placement;  Surgeon: Sherrilee Belvie CROME, MD;  Location: [facility];  Service: Urology;  Laterality: Bilateral;  1 HR  . CYSTOSCOPY WITH URETEROSCOPY  03/28/2012   Procedure: CYSTOSCOPY WITH URETEROSCOPY;  Surgeon: Garnette Shack, MD;  Location: [facility];  Service: Urology;  Laterality: Right;  RIGHT URETEROSCOPIC STONE EXTRACTION W/  LASER (Pt is [redacted] wk pregnant)    . CYSTOSCOPY/URETEROSCOPY/HOLMIUM LASER/STENT PLACEMENT Left 10/20/2021   Procedure: CYSTOSCOPY LEFT RETROGRADE PYELOGRAM, URETERAL BALLOON DILATION, URETEROSCOPY/HOLMIUM LASER/STENT PLACEMENT;  Surgeon: Cam Morene ORN, MD;  Location: [facility];  Service: Urology;  Laterality: Left;  . EXTRACORPOREAL SHOCK WAVE LITHOTRIPSY Left 11/09/2016   Procedure: EXTRACORPOREAL SHOCK WAVE LITHOTRIPSY (ESWL);  Surgeon: Morene ORN Cam, MD;  Location: [facility];  Service: Urology;  Laterality: Left;  . HOLMIUM LASER APPLICATION Bilateral 01/17/2020   Procedure: HOLMIUM LASER APPLICATION;  Surgeon: Sherrilee Belvie CROME, MD;  Location: [facility];  Service: Urology;  Laterality: Bilateral;  . STONE EXTRACTION WITH BASKET  03/28/2012   Procedure: STONE EXTRACTION WITH BASKET;  Surgeon: Garnette Shack, MD;  Location: [facility];  Service: Urology;  Laterality: Right;  . TONSILLECTOMY  08/21/2004  . TUBAL LIGATION  08/04/2012   Procedure: POST PARTUM TUBAL LIGATION;  Surgeon: Shanda SHAUNNA Muscat, MD;  Location: [facility];  Service: Gynecology;  Laterality: Bilateral;  Bilateral post partum tubal ligation  . WISDOM TOOTH EXTRACTION  08/22/1999     reports that she has quit smoking. She has never used smokeless tobacco. She reports current alcohol use. She reports that she does not use drugs.  Allergies  Allergen Reactions  . Amoxicillin Rash and Other (See Comments)    Has patient had a PCN reaction causing immediate rash, facial/tongue/throat swelling, SOB or lightheadedness with hypotension: No Has patient had a PCN reaction causing severe rash involving mucus membranes or skin necrosis: No Has patient had a PCN reaction that required hospitalization No Has patient had a PCN reaction occurring within the last 10 years: No If all of the above answers are NO, then may proceed with Cephalosporin use.  . Egg-Derived Products Rash    Family History  Problem  Relation Age of Onset  . Heart disease Father        heart attack in oct.  lm  . Cancer Maternal Uncle        bone cancer  . Diabetes Maternal Uncle   . Cancer Maternal Grandmother        ovarian cancer  . Suicidality Mother   . Diabetes Maternal Aunt   . Cancer Maternal Aunt        lung  . Hypertension Paternal Uncle     Prior to Admission medications   Medication Sig Start Date End Date Taking? Authorizing Provider  Ibuprofen  200 MG CAPS Take 200-800 mg by mouth every 6 (six) hours as needed (for pain).   Yes [provider]  sulfamethoxazole -trimethoprim  (BACTRIM  DS) 800-160 MG tablet Take 1 tablet by mouth 2 (two) times daily.   Yes [provider]  TYLENOL  325 MG tablet Take 650 mg by mouth every 6 (six) hours as needed (for pain).   Yes [provider]  naproxen  (NAPROSYN ) 500 MG tablet Take 1 tablet (500 mg total) by mouth 2 (two) times daily as needed for moderate pain. Patient not taking: Reported on 02/21/2024 01/22/20   Schuyler Charlie RAMAN, MD  ondansetron  (ZOFRAN ) 4 MG tablet Take 1 tablet (4 mg total) by mouth every 6 (six) hours. Patient not taking: Reported on 02/21/2024 10/05/21   Long, Fonda MATSU, MD  oxyCODONE -acetaminophen  (PERCOCET/ROXICET) 5-325 MG tablet Take 1 tablet by mouth every 6 (six) hours as needed for severe pain. Patient not taking: Reported on 02/21/2024 10/25/21   Conklin, Erica R, PA-C  phenazopyridine  (PYRIDIUM ) 200 MG tablet Take 1 tablet (200 mg total) by mouth 3 (three) times daily as needed for pain. Patient not taking: Reported on 02/21/2024 10/20/21   Cam Morene ORN, MD  senna-docusate (SENOKOT-S) 8.6-50 MG tablet Take 1 tablet by mouth at bedtime as needed for mild constipation or moderate constipation. Patient not taking: Reported on 02/21/2024 10/05/21   Long, Fonda MATSU, MD  traMADol  (ULTRAM ) 50 MG tablet Take 1-2 tablets (50-100 mg total) by mouth every 6 (six) hours as needed for moderate pain. Patient not taking: Reported on  02/21/2024 10/20/21   Cam Morene ORN, MD     Physical Exam: Vitals:   02/21/24 2106 02/21/24 2112  BP: (!) 138/98   Pulse: (!) 116   Resp: 20   Temp: 99.6 F (37.6 C)   SpO2: 97%   Weight:  79.4 kg  Height:  5' 10 (1.778 m)    Physical Exam   Labs on Admission: I have personally reviewed following labs and imaging studies  CBC: Recent Labs  Lab 02/20/24 2109 02/21/24 2308  WBC 16.6* 19.5*  NEUTROABS 14.3* 16.8*  HGB 10.8* 10.0*  HCT 32.0* 29.5*  MCV 92.8 93.1  PLT 500* 431*   Basic Metabolic Panel: Recent Labs  Lab 02/20/24 2109 02/21/24 2308  NA 133* 135  K 3.5 3.6  CL 98 105  CO2 23 21*  GLUCOSE 126* 119*  BUN 10 10  CREATININE 0.61 0.54  CALCIUM  8.8* 8.6*   GFR: Estimated Creatinine Clearance: 102.1 mL/min (by C-G formula based on SCr of 0.54 mg/dL). Liver Function Tests: Recent Labs  Lab 02/20/24 2109 02/21/24 2308  AST 17 16  ALT 16 20  ALKPHOS 62 75  BILITOT 0.6 0.7  PROT 6.8 6.3*  ALBUMIN 3.3* 3.1*   No results for input(s): LIPASE, AMYLASE in the last 168 hours. No results for input(s): AMMONIA in the last 168 hours. Coagulation Profile: No results for input(s): INR, PROTIME in the last 168 hours. Cardiac Enzymes: No results for input(s): CKTOTAL, CKMB, CKMBINDEX, TROPONINI, TROPONINIHS in the last 168 hours. BNP (last 3 results) No results for input(s): BNP in the last 8760 hours. HbA1C: No results for input(s): HGBA1C in the last 72 hours. CBG: No results for input(s): GLUCAP in the last 168 hours. Lipid Profile: No results for input(s): CHOL, HDL, LDLCALC, TRIG, CHOLHDL, LDLDIRECT in the last 72 hours. Thyroid  Function Tests: No results for input(s): TSH, T4TOTAL, FREET4, T3FREE, THYROIDAB in the last 72 hours. Anemia Panel: No results for input(s): VITAMINB12, FOLATE, FERRITIN, TIBC, IRON, RETICCTPCT in the last 72 hours. Urine analysis:    Component Value  Date/Time   COLORURINE AMBER (A) 10/25/2021 0640   APPEARANCEUR CLOUDY (A) 10/25/2021 0640   LABSPEC 1.024 10/25/2021 0640   PHURINE 5.0 10/25/2021 0640   GLUCOSEU NEGATIVE 10/25/2021 0640   HGBUR LARGE (A) 10/25/2021 0640   BILIRUBINUR NEGATIVE 10/25/2021 9359  KETONESUR NEGATIVE 10/25/2021 0640   PROTEINUR >=300 (A) 10/25/2021 0640   UROBILINOGEN 1.0 03/03/2015 1520   NITRITE NEGATIVE 10/25/2021 0640   LEUKOCYTESUR NEGATIVE 10/25/2021 0640    Radiological Exams on Admission: I have personally reviewed images DG Chest Port 1 View Result Date: 02/21/2024 CLINICAL DATA:  Recent BBL, abscess on buttocks,fever and ? Of sepsis now, patient is prone due to pain Questionable sepsis - evaluate for abnormality EXAM: PORTABLE CHEST 1 VIEW COMPARISON:  Chest x-ray 11/09/2008 FINDINGS: Limited evaluation due to overlapping osseous structures and overlying soft tissues. The heart and mediastinal contours are within normal limits. No focal consolidation. No pulmonary edema. No pleural effusion. No pneumothorax. No acute osseous abnormality. IMPRESSION: No active disease. Limited evaluation due to overlapping osseous structures and overlying soft tissues. Consider repeat chest x-ray PA and lateral view for further evaluation. Electronically Signed   By: Morgane  Naveau M.D.   On: 02/21/2024 23:16   CT ABDOMEN PELVIS W CONTRAST Result Date: 02/21/2024 CLINICAL DATA:  Fever with dizziness, headache, fever, chills and nausea. EXAM: CT ABDOMEN AND PELVIS WITH CONTRAST TECHNIQUE: Multidetector CT imaging of the abdomen and pelvis was performed using the standard protocol following bolus administration of intravenous contrast. RADIATION DOSE REDUCTION: This exam was performed according to the departmental dose-optimization program which includes automated exposure control, adjustment of the mA and/or kV according to patient size and/or use of iterative reconstruction technique. CONTRAST:  OMNIPAQUE  IOHEXOL  300  MG/ML  SOLN COMPARISON:  October 25, 2021 FINDINGS: Lower chest: No acute abnormality. Hepatobiliary: No focal liver abnormality is seen. Status post cholecystectomy. No biliary dilatation. Pancreas: Unremarkable. No pancreatic ductal dilatation or surrounding inflammatory changes. Spleen: Normal in size without focal abnormality. Adrenals/Urinary Tract: Adrenal glands are unremarkable. Kidneys are normal, without obstructing renal calculi or focal lesions. Prominent bilateral extrarenal pelvis are seen with mild to moderate severity right-sided hydroureter also noted. A 2 mm nonobstructing renal calculus is seen within the lower pole of the right kidney. 1 mm nonobstructing renal calculi are seen within the mid left kidney. Bladder is unremarkable. Stomach/Bowel: Stomach is within normal limits. Appendix appears normal. No evidence of bowel wall thickening, distention, or inflammatory changes. Vascular/Lymphatic: No significant vascular findings are present. No enlarged abdominal or pelvic lymph nodes. Reproductive: Uterus and bilateral adnexa are unremarkable. Other: No abdominal wall hernia or abnormality. No abdominopelvic ascites. Musculoskeletal: A 5.2 cm x 6.0 cm x 7.3 cm collection of fluid and air is seen within the subcutaneous fat along the posterolateral pelvic wall on the right. Moderate to marked severity surrounding inflammatory fat stranding is seen. Patchy, moderate severity areas of subcutaneous inflammatory fat stranding and subcentimeter foci of soft tissue air are seen inferior to this region. Diffuse mild to moderate severity inflammatory fat stranding is present throughout the remaining portions of the posterior, posterolateral and lateral pelvic walls, bilaterally. No acute osseous abnormalities are identified. IMPRESSION: 1. 5.2 cm x 6.0 cm x 7.3 cm abscess within the subcutaneous fat along the posterolateral pelvic wall on the right with additional areas of adjacent cellulitis and subcutaneous  emphysema. 2. Bilateral nonobstructing renal calculi. 3. Evidence of prior cholecystectomy. Electronically Signed   By: Suzen Dials M.D.   On: 02/21/2024 00:15     EKG: My personal interpretation of EKG shows: ***    Assessment/Plan: Active Problems:   * No active hospital problems. *    Assessment and Plan: No notes have been filed under this hospital service. Service: Hospitalist  DVT prophylaxis:  {Blank single:19197::Lovenox ,SQ Heparin,IV heparin gtts,Xarelto,Eliquis,Coumadin,SCDs,***} Code Status:  {Blank single:19197::Full Code,DNR with Intubation,DNR/DNI(Do NOT Intubate),Comfort Care,***} Diet:  Family Communication:  *** Family was present at bedside, at the time of interview.  Opportunity was given to ask question and all questions were answered satisfactorily.  Disposition Plan:  ***  Consults:  ***  Admission status:   {Blank single:19197::Observation,Inpatient}, {Blank single:19197::Med-Surg,Telemetry bed,Step Down Unit}  Severity of Illness: {Observation/Inpatient:21159}    Micaela Speaker, MD [facility]  How to contact the [facility] Attending or Consulting provider 7A - 7P or covering provider during after hours 7P -7A, for this patient.  Check the care team in [facility] and look for a) attending/consulting [facility] provider listed and b) the [facility] team listed Log into [email or web address] and use [facility] universal password to access. If you do not have the password, please contact the hospital operator. Locate the [facility] provider you are looking for under [facility] and page to a number that you can be directly reached. If you still have difficulty reaching the provider, please page the [facility] (Director on Call) for the Hospitalists listed on amion for assistance.  02/22/2024, 12:03 AM

## 2024-02-21 NOTE — ED Triage Notes (Signed)
  Patient comes in with post op issue and fever.  Patient was seen here last night for same.  Was placed on antibiotics, pain meds, and had area aspirated.  Was told to follow up with surgeon today and he told her to come back here.  Patient states her pain is a 7/10, and running fevers at home.  Also complaining of severe headache.  Took tylenol  and motrin  around 1400.

## 2024-02-21 NOTE — Progress Notes (Signed)
 Following for sepsis monitoring ?

## 2024-02-21 NOTE — H&P (Addendum)
 History and Physical    [name] Bass FMW:[id number] DOB: 04-24-1986 DOA: 02/21/2024  PCP: Patient, No Pcp Per   Patient coming from: Home   Chief Complaint:  Chief Complaint  Patient presents with   Post-op Problem   Fever   ED TRIAGE note:    Patient comes in with post op issue and fever.  Patient was seen here last night for same.  Was placed on antibiotics, pain meds, and had area aspirated.  Was told to follow up with surgeon today and he told her to come back here.  Patient states her pain is a 7/10, and running fevers at home.  Also complaining of severe headache.  Took tylenol  and motrin  around 1400.        HPI:  [name] Bass is a 38 y.o. female with medical history significant of Brazilian butt lift procedure postop day 16 presented to emergency for the second time to bed complaining of worsening fever, persistent pain. Patient had recent [place] butt lift procedure done by Dr. Aisha at outpatient surgical center in [facility] 02/05/2024.  5 days ago patient had a pain started on the right buttock with associated fever.  She was seen by her surgeon reported aspirated the area of the right buttock and started on Bactrim  however presented to ED for the second time today for persistent pain.  In the ED yesterday patient found to have suspected cellulitis and questionable abscess of the right buttock confirmed on the CT imaging subsequently underwent aspiration around 10 cc purulent discharge and patient was sent home to follow-up with surgeon in the office 7/5. However further workup in the ED today revealed concern for abscess formation. ED physician initially spoke with patient's or general surgeon Dr. Aisha who stated that he does not have any hospital privilege to do any surgical procedure and recommended to speak with our inpatient surgeon for further evaluation.   Later on Dr. Garnette Schultze has been consulted..  Per general surgery no suspicion for Fournier's  gangrene/necrotic fasciitis at this time.  Recommended IV antibiotic coverage and plan to take her OR in the morning plan for I&D.   At presentation to ED patient is tachycardic otherwise hemodynamically stable. CBC showing leukocytosis 19.5, stable H&H normal platelet count. CMP showing low bicarb 21, low albumin 3.1 otherwise unremarkable. Lactic acid within normal range. Pregnancy test negative. EKG shows sinus tachycardia heart rate 128. Blood cultures are in process.  CT abdomen pelvis from 7/2 showed 5.2 cm x 6.0 cm x 7.3 cm abscess within the subcutaneous fat along the posterolateral pelvic wall on the right with additional areas of adjacent cellulitis and subcutaneous emphysema. 2. Bilateral nonobstructing renal calculi. 3. Evidence of prior cholecystectomy.  In the ED patient has been given cefazolin  for surgical prophylaxis and 1 L of LR bolus followed by continue LR 150 cc/h.  In the ED patient has been given vancomycin  and cefepime  per ED physician and general surgery has been ordered cefazolin  and metronidazole .  Hospitalist has been consulted for further management of postop complication associated with posterolateral pelvic wall abscess.  Lab Orders         Resp panel by RT-PCR (RSV, Flu A&B, Covid) Anterior Nasal Swab         Blood Culture (routine x 2)         MRSA Next Gen by PCR, Nasal         Comprehensive metabolic panel         CBC with Differential  hCG, serum, qualitative         Protime-INR         Comprehensive metabolic panel         CBC         APTT         Protime-INR         HIV Antibody (routine testing w rflx)         I-Stat Lactic Acid, ED       Review of Systems:  Review of Systems  Constitutional:  Positive for fever. Negative for chills, malaise/fatigue and weight loss.  Respiratory:  Negative for cough, sputum production and shortness of breath.   Cardiovascular:  Negative for chest pain and palpitations.  Gastrointestinal:   Negative for abdominal pain, diarrhea, heartburn, nausea and vomiting.  Musculoskeletal:  Positive for back pain. Negative for falls, joint pain, myalgias and neck pain.  Neurological:  Negative for dizziness and headaches.    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   History of kidney stones    Hx: UTI (urinary tract infection)    Hypertension    Infection    UTI   Irregular periods/menstrual cycles    Polycystic ovarian syndrome 05/08/05   Vaginal Pap smear, abnormal    Vaginitis and vulvovaginitis 04/29/04    Past Surgical History:  Procedure Laterality Date   Sud[place] Butt Lift     CHOLECYSTECTOMY, LAPAROSCOPIC  08/21/2008   CYSTOSCOPY WITH RETROGRADE PYELOGRAM, URETEROSCOPY AND STENT PLACEMENT Bilateral 01/17/2020   Procedure: CYSTOSCOPY WITH RETROGRADE PYELOGRAM, URETEROSCOPY bilateral right double j stent placement;  Surgeon: Sherrilee Belvie CROME, MD;  Location: [facility];  Service: Urology;  Laterality: Bilateral;  1 HR   CYSTOSCOPY WITH URETEROSCOPY  03/28/2012   Procedure: CYSTOSCOPY WITH URETEROSCOPY;  Surgeon: Garnette Shack, MD;  Location: [facility];  Service: Urology;  Laterality: Right;  RIGHT URETEROSCOPIC STONE EXTRACTION W/ LASER (Pt is [redacted] wk pregnant)     CYSTOSCOPY/URETEROSCOPY/HOLMIUM LASER/STENT PLACEMENT Left 10/20/2021   Procedure: CYSTOSCOPY LEFT RETROGRADE PYELOGRAM, URETERAL BALLOON DILATION, URETEROSCOPY/HOLMIUM LASER/STENT PLACEMENT;  Surgeon: Cam Morene ORN, MD;  Location[facility];  Service: Urology;  Laterality: Left;   EXTRACORPOREAL SHOCK WAVE LITHOTRIPSY Left  11/09/2016   Procedure: EXTRACORPOREAL SHOCK WAVE LITHOTRIPSY (ESWL);  Surgeon: Morene ORN Cam, MD;  Location: [facility];  Service: Urology;  Laterality: Left;   HOLMIUM LASER APPLICATION Bilateral 01/17/2020   Procedure: HOLMIUM LASER APPLICATION;  Surgeon: Sherrilee Belvie CROME, MD;  Location: [facility];  Service: Urology;  Laterality: Bilateral;   STONE EXTRACTION WITH BASKET  03/28/2012   Procedure: STONE EXTRACTION WITH BASKET;  Surgeon: Garnette Shack, MD;  Location: [facility];  Service: Urology;  Laterality: Right;   TONSILLECTOMY  08/21/2004   TUBAL LIGATION  08/04/2012   Procedure: POST PARTUM TUBAL LIGATION;  Surgeon: Shanda SHAUNNA Muscat, MD;  Location: [facility];  Service: Gynecology;  Laterality: Bilateral;  Bilateral post partum tubal ligation   WISDOM TOOTH EXTRACTION  08/22/1999     reports that she has quit smoking. She has never  used smokeless tobacco. She reports current alcohol use. She reports that she does not use drugs.  Allergies  Allergen Reactions   Amoxicillin Rash and Other (See Comments)    Has patient had a PCN reaction causing immediate rash, facial/tongue/throat swelling, SOB or lightheadedness with hypotension: No Has patient had a PCN reaction causing severe rash involving mucus membranes or skin necrosis: No Has patient had a PCN reaction that required hospitalization No Has patient had a PCN reaction occurring within the last 10 years: No If all of the above answers are NO, then may proceed with Cephalosporin use.   Egg-Derived Products Rash    Family History  Problem Relation Age of Onset   Heart disease Father        heart attack in 57.  lm   Cancer Maternal Uncle        bone cancer   Diabetes Maternal Uncle    Cancer Maternal Grandmother        ovarian cancer   Suicidality Mother    Diabetes Maternal Aunt    Cancer Maternal Aunt        lung   Hypertension Paternal Uncle     Prior to Admission medications   Medication Sig Start Date End Date  Taking? Authorizing Provider  Ibuprofen  200 MG CAPS Take 200-800 mg by mouth every 6 (six) hours as needed (for pain).   Yes [provider]  sulfamethoxazole -trimethoprim  (BACTRIM  DS) 800-160 MG tablet Take 1 tablet by mouth 2 (two) times daily.   Yes [provider]  TYLENOL  325 MG tablet Take 650 mg by mouth every 6 (six) hours as needed (for pain).   Yes [provider]  naproxen  (NAPROSYN ) 500 MG tablet Take 1 tablet (500 mg total) by mouth 2 (two) times daily as needed for moderate pain. Patient not taking: Reported on 02/21/2024 01/22/20   Schuyler Charlie RAMAN, MD  ondansetron  (ZOFRAN ) 4 MG tablet Take 1 tablet (4 mg total) by mouth every 6 (six) hours. Patient not taking: Reported on 02/21/2024 10/05/21   Long, Fonda MATSU, MD  oxyCODONE -acetaminophen  (PERCOCET/ROXICET) 5-325 MG tablet Take 1 tablet by mouth every 6 (six) hours as needed for severe pain. Patient not taking: Reported on 02/21/2024 10/25/21   Conklin, Erica R, PA-C  phenazopyridine  (PYRIDIUM ) 200 MG tablet Take 1 tablet (200 mg total) by mouth 3 (three) times daily as needed for pain. Patient not taking: Reported on 02/21/2024 10/20/21   Cam Morene ORN, MD  senna-docusate (SENOKOT-S) 8.6-50 MG tablet Take 1 tablet by mouth at bedtime as needed for mild constipation or moderate constipation. Patient not taking: Reported on 02/21/2024 10/05/21   Long, Fonda MATSU, MD  traMADol  (ULTRAM ) 50 MG tablet Take 1-2 tablets (50-100 mg total) by mouth every 6 (six) hours as needed for moderate pain. Patient not taking: Reported on 02/21/2024 10/20/21   Cam Morene ORN, MD     Physical Exam: Vitals:   02/21/24 2112 02/22/24 0057 02/22/24 0100 02/22/24 0142  BP:    123/73  Pulse:    (!) 119  Resp:    18  Temp:  98.9 F (37.2 C)  99.7 F (37.6 C)  TempSrc:  Oral  Oral  SpO2:    97%  Weight: 79.4 kg  82.3 kg   Height: 5' 10 (1.778 m)  5' 10 (1.778 m)     Physical Exam Vitals and nursing note reviewed.   Constitutional:      Appearance: Normal appearance.  HENT:  Mouth/Throat:     Mouth: Mucous membranes are moist.  Eyes:     Pupils: Pupils are equal, round, and reactive to light.  Cardiovascular:     Rate and Rhythm: Normal rate and regular rhythm.     Pulses: Normal pulses.     Heart sounds: Normal heart sounds.  Pulmonary:     Effort: Pulmonary effort is normal.     Breath sounds: Normal breath sounds.  Abdominal:     General: There is no distension.     Palpations: Abdomen is soft.     Comments: Right-sided posterior pelvic wall muscle erythema and induration.  Musculoskeletal:     Cervical back: Neck supple.  Skin:    Capillary Refill: Capillary refill takes less than 2 seconds.  Neurological:     Mental Status: She is alert and oriented to person, place, and time.  Psychiatric:        Mood and Affect: Mood normal.      Labs on Admission: I have personally reviewed following labs and imaging studies  CBC: Recent Labs  Lab 02/20/24 2109 02/21/24 2308  WBC 16.6* 19.5*  NEUTROABS 14.3* 16.8*  HGB 10.8* 10.0*  HCT 32.0* 29.5*  MCV 92.8 93.1  PLT 500* 431*   Basic Metabolic Panel: Recent Labs  Lab 02/20/24 2109 02/21/24 2308  NA 133* 135  K 3.5 3.6  CL 98 105  CO2 23 21*  GLUCOSE 126* 119*  BUN 10 10  CREATININE 0.61 0.54  CALCIUM  8.8* 8.6*   GFR: Estimated Creatinine Clearance: 110.3 mL/min (by C-G formula based on SCr of 0.54 mg/dL). Liver Function Tests: Recent Labs  Lab 02/20/24 2109 02/21/24 2308  AST 17 16  ALT 16 20  ALKPHOS 62 75  BILITOT 0.6 0.7  PROT 6.8 6.3*  ALBUMIN 3.3* 3.1*   No results for input(s): LIPASE, AMYLASE in the last 168 hours. No results for input(s): AMMONIA in the last 168 hours. Coagulation Profile: Recent Labs  Lab 02/22/24 0020  INR 1.1   Cardiac Enzymes: No results for input(s): CKTOTAL, CKMB, CKMBINDEX, TROPONINI, TROPONINIHS in the last 168 hours. BNP (last 3 results) No results  for input(s): BNP in the last 8760 hours. HbA1C: No results for input(s): HGBA1C in the last 72 hours. CBG: No results for input(s): GLUCAP in the last 168 hours. Lipid Profile: No results for input(s): CHOL, HDL, LDLCALC, TRIG, CHOLHDL, LDLDIRECT in the last 72 hours. Thyroid  Function Tests: No results for input(s): TSH, T4TOTAL, FREET4, T3FREE, THYROIDAB in the last 72 hours. Anemia Panel: No results for input(s): VITAMINB12, FOLATE, FERRITIN, TIBC, IRON, RETICCTPCT in the last 72 hours. Urine analysis:    Component Value Date/Time   COLORURINE AMBER (A) 10/25/2021 0640   APPEARANCEUR CLOUDY (A) 10/25/2021 0640   LABSPEC 1.024 10/25/2021 0640   PHURINE 5.0 10/25/2021 0640   GLUCOSEU NEGATIVE 10/25/2021 0640   HGBUR LARGE (A) 10/25/2021 0640   BILIRUBINUR NEGATIVE 10/25/2021 0640   KETONESUR NEGATIVE 10/25/2021 0640   PROTEINUR >=300 (A) 10/25/2021 0640   UROBILINOGEN 1.0 03/03/2015 1520   NITRITE NEGATIVE 10/25/2021 0640   LEUKOCYTESUR NEGATIVE 10/25/2021 0640    Radiological Exams on Admission: I have personally reviewed images DG Chest Port 1 View Result Date: 02/21/2024 CLINICAL DATA:  Recent BBL, abscess on buttocks,fever and ? Of sepsis now, patient is prone due to pain Questionable sepsis - evaluate for abnormality EXAM: PORTABLE CHEST 1 VIEW COMPARISON:  Chest x-ray 11/09/2008 FINDINGS: Limited evaluation due to overlapping osseous structures and overlying  soft tissues. The heart and mediastinal contours are within normal limits. No focal consolidation. No pulmonary edema. No pleural effusion. No pneumothorax. No acute osseous abnormality. IMPRESSION: No active disease. Limited evaluation due to overlapping osseous structures and overlying soft tissues. Consider repeat chest x-ray PA and lateral view for further evaluation. Electronically Signed   By: Morgane  Naveau M.D.   On: 02/21/2024 23:16   CT ABDOMEN PELVIS W CONTRAST Result  Date: 02/21/2024 CLINICAL DATA:  Fever with dizziness, headache, fever, chills and nausea. EXAM: CT ABDOMEN AND PELVIS WITH CONTRAST TECHNIQUE: Multidetector CT imaging of the abdomen and pelvis was performed using the standard protocol following bolus administration of intravenous contrast. RADIATION DOSE REDUCTION: This exam was performed according to the departmental dose-optimization program which includes automated exposure control, adjustment of the mA and/or kV according to patient size and/or use of iterative reconstruction technique. CONTRAST:  OMNIPAQUE  IOHEXOL  300 MG/ML  SOLN COMPARISON:  October 25, 2021 FINDINGS: Lower chest: No acute abnormality. Hepatobiliary: No focal liver abnormality is seen. Status post cholecystectomy. No biliary dilatation. Pancreas: Unremarkable. No pancreatic ductal dilatation or surrounding inflammatory changes. Spleen: Normal in size without focal abnormality. Adrenals/Urinary Tract: Adrenal glands are unremarkable. Kidneys are normal, without obstructing renal calculi or focal lesions. Prominent bilateral extrarenal pelvis are seen with mild to moderate severity right-sided hydroureter also noted. A 2 mm nonobstructing renal calculus is seen within the lower pole of the right kidney. 1 mm nonobstructing renal calculi are seen within the mid left kidney. Bladder is unremarkable. Stomach/Bowel: Stomach is within normal limits. Appendix appears normal. No evidence of bowel wall thickening, distention, or inflammatory changes. Vascular/Lymphatic: No significant vascular findings are present. No enlarged abdominal or pelvic lymph nodes. Reproductive: Uterus and bilateral adnexa are unremarkable. Other: No abdominal wall hernia or abnormality. No abdominopelvic ascites. Musculoskeletal: A 5.2 cm x 6.0 cm x 7.3 cm collection of fluid and air is seen within the subcutaneous fat along the posterolateral pelvic wall on the right. Moderate to marked severity surrounding inflammatory  fat stranding is seen. Patchy, moderate severity areas of subcutaneous inflammatory fat stranding and subcentimeter foci of soft tissue air are seen inferior to this region. Diffuse mild to moderate severity inflammatory fat stranding is present throughout the remaining portions of the posterior, posterolateral and lateral pelvic walls, bilaterally. No acute osseous abnormalities are identified. IMPRESSION: 1. 5.2 cm x 6.0 cm x 7.3 cm abscess within the subcutaneous fat along the posterolateral pelvic wall on the right with additional areas of adjacent cellulitis and subcutaneous emphysema. 2. Bilateral nonobstructing renal calculi. 3. Evidence of prior cholecystectomy. Electronically Signed   By: Suzen Dials M.D.   On: 02/21/2024 00:15     EKG: My personal interpretation of EKG shows: Sinus tachycardia heart rate 128.    Assessment/Plan: Principal Problem:   Abscess of left buttock    Assessment and Plan: Postop complication-posterolateral right pelvic wall abscess -Patient recently has [place] butt lift procedure which has been uncomplicated with abscess formation.  ED physician initially reach out to patient's plastic surgeon Dr. Aisha who stated the abscess cannot be drained outpatient and recommended inpatient general surgery evaluation at [facility] - At presentation to ED patient is tachycardic.  Afebrile and normotensive.  CBC showing leukocytosis otherwise CBC and CMP unremarkable - CT abdomen pelvis showed 5.2 cm x 6.0 cm x 7.3 cm abscess within the subcutaneous fat along the posterolateral pelvic wall on the right with additional areas of adjacent cellulitis and subcutaneous emphysema. -General  surgeon Dr. Sheldon has been consulted plan to take TO OR in the morning for incision and drainage.  Per general surgery recommendation continuing IV cefazolin  and metronidazole .  If MRSA screening positive recommended to give IV vancomycin . -ED provider reported give 1  dose of cefepime  and vancomycin  and 1 L of LR bolus. - Continue maintenance fluid LR 150 cc/h. -Need to follow-up with blood cultures and pelvicwall abscess result for appropriate antibiotic guidance. -Appreciate general surgery consult and involvement of this patient's case.    DVT prophylaxis:  SQ Heparin  Code Status:  Full Code Diet: N.p.o. Family Communication:   Family was present at bedside, at the time of interview. Opportunity was given to ask question and all questions were answered satisfactorily.  Disposition Plan: Need to follow-up with blood culture and pelvic wall abscess culture. Consults: General surgery Admission status:   Inpatient, Telemetry bed  Severity of Illness: The appropriate patient status for this patient is INPATIENT. Inpatient status is judged to be reasonable and necessary in order to provide the required intensity of service to ensure the patient's safety. The patient's presenting symptoms, physical exam findings, and initial radiographic and laboratory data in the context of their chronic comorbidities is felt to place them at high risk for further clinical deterioration. Furthermore, it is not anticipated that the patient will be medically stable for discharge from the hospital within 2 midnights of admission.   * I certify that at the point of admission it is my clinical judgment that the patient will require inpatient hospital care spanning beyond 2 midnights from the point of admission due to high intensity of service, high risk for further deterioration and high frequency of surveillance required.DEWAINE    Alice Burnside, MD [facility]  How to contact the [facility] Attending or Consulting provider 7A - 7P or covering provider during after hours 7P -7A, for this patient.  Check the care team in [facility] and look for a) attending/consulting [facility] provider listed and b) the [facility] team listed Log into [email or web address] and use [place]'s universal password to access.  If you do not have the password, please contact the hospital operator. Locate the [facility] provider you are looking for under [facility] and page to a number that you can be directly reached. If you still have difficulty reaching the provider, please page the [facility] (Director on Call) for the Hospitalists listed on amion for assistance.  02/22/2024, 1:47 AM

## 2024-02-21 NOTE — ED Provider Notes (Addendum)
 [place] [facility] AT [facility] Provider Note   CSN: [id number] Arrival date & time: 02/21/24  2054     Patient presents with: Post-op Problem and Fever   [name] Bass is a 38 y.o. female who presents to the Emergency Department tonight with concern for worsening fever, persistent pain in her postoperative site, pod 16.  Patient had presented but left with Dr. Aisha at an outpatient surgical center in [place] on 02/05/2024.  5 days ago patient began with pain in the right buttock and fevers.  She was seen by her surgeon who  reportedly aspirated an area on her right buttock and started her on Bactrim  but she presented to the ER yesterday with persistent symptoms.  She was found to SIRS criteria in the ED yesterday with concern for suspected cellulitis and question of abscess in the right buttock which was confirmed on CT imaging Which identified a 5 x 6 x 7 cm abscess within the subcutaneous fat on the posterior lateral pelvic wall on the right with adjacent cellulitis and subcu emphysema.  After discussion with the patient surgeon ED providers last night aspirated 10 cc of purulent discharge from the area.  The area was felt to be too deep to safely be dissected in the emergency department and the patient was referred back to her surgeon.  She states that he will not see her in the office until 7/5.  She presented to the ED today with increased fever and weakness.   HPI     Prior to Admission medications   Medication Sig Start Date End Date Taking? Authorizing Provider  Ibuprofen  200 MG CAPS Take 200-800 mg by mouth every 6 (six) hours as needed (for pain).   Yes [provider]  sulfamethoxazole -trimethoprim  (BACTRIM  DS) 800-160 MG tablet Take 1 tablet by mouth 2 (two) times daily.   Yes [provider]  TYLENOL  325 MG tablet Take 650 mg by mouth every 6 (six) hours as needed (for pain).   Yes [provider]  naproxen  (NAPROSYN ) 500  MG tablet Take 1 tablet (500 mg total) by mouth 2 (two) times daily as needed for moderate pain. Patient not taking: Reported on 02/21/2024 01/22/20   Schuyler Charlie RAMAN, MD  ondansetron  (ZOFRAN ) 4 MG tablet Take 1 tablet (4 mg total) by mouth every 6 (six) hours. Patient not taking: Reported on 02/21/2024 10/05/21   Long, Fonda MATSU, MD  oxyCODONE -acetaminophen  (PERCOCET/ROXICET) 5-325 MG tablet Take 1 tablet by mouth every 6 (six) hours as needed for severe pain. Patient not taking: Reported on 02/21/2024 10/25/21   Conklin, Erica R, PA-C  phenazopyridine  (PYRIDIUM ) 200 MG tablet Take 1 tablet (200 mg total) by mouth 3 (three) times daily as needed for pain. Patient not taking: Reported on 02/21/2024 10/20/21   Cam Morene ORN, MD  senna-docusate (SENOKOT-S) 8.6-50 MG tablet Take 1 tablet by mouth at bedtime as needed for mild constipation or moderate constipation. Patient not taking: Reported on 02/21/2024 10/05/21   Long, Fonda MATSU, MD  traMADol  (ULTRAM ) 50 MG tablet Take 1-2 tablets (50-100 mg total) by mouth every 6 (six) hours as needed for moderate pain. Patient not taking: Reported on 02/21/2024 10/20/21   Cam Morene ORN, MD    Allergies: Amoxicillin and Egg-derived products    Review of Systems  Constitutional:  Positive for appetite change, chills, fatigue and fever.  Musculoskeletal:        Pain in the R buttock    Updated Vital Signs BP ROLLEN)  138/98   Pulse (!) 116   Temp 99.6 F (37.6 C)   Resp 20   Ht 5' 10 (1.778 m)   Wt 79.4 kg   SpO2 97%   BMI 25.11 kg/m   Physical Exam Vitals and nursing note reviewed.  Constitutional:      Appearance: She is not ill-appearing or toxic-appearing.  HENT:     Head: Normocephalic and atraumatic.     Mouth/Throat:     Mouth: Mucous membranes are moist.     Pharynx: No oropharyngeal exudate or posterior oropharyngeal erythema.  Eyes:     General:        Right eye: No discharge.        Left eye: No discharge.     Extraocular Movements:  Extraocular movements intact.     Conjunctiva/sclera: Conjunctivae normal.     Pupils: Pupils are equal, round, and reactive to light.  Cardiovascular:     Rate and Rhythm: Regular rhythm. Tachycardia present.     Pulses: Normal pulses.     Heart sounds: Normal heart sounds. No murmur heard. Pulmonary:     Effort: Pulmonary effort is normal. No respiratory distress.     Breath sounds: Normal breath sounds. No wheezing or rales.  Abdominal:     General: Bowel sounds are normal. There is no distension.     Palpations: Abdomen is soft.     Tenderness: There is generalized abdominal tenderness. There is no right CVA tenderness, left CVA tenderness, guarding or rebound.   Musculoskeletal:        General: No deformity.     Cervical back: Neck supple.     Right lower leg: No edema.     Left lower leg: No edema.       Legs:  Skin:    General: Skin is warm and dry.     Capillary Refill: Capillary refill takes less than 2 seconds.  Neurological:     General: No focal deficit present.     Mental Status: She is alert and oriented to person, place, and time. Mental status is at baseline.  Psychiatric:        Mood and Affect: Mood normal.    (all labs ordered are listed, but only abnormal results are displayed) Labs Reviewed  COMPREHENSIVE METABOLIC PANEL WITH GFR - Abnormal; Notable for the following components:      Result Value   CO2 21 (*)    Glucose, Bld 119 (*)    Calcium  8.6 (*)    Total Protein 6.3 (*)    Albumin 3.1 (*)    All other components within normal limits  CBC WITH DIFFERENTIAL/PLATELET - Abnormal; Notable for the following components:   WBC 19.5 (*)    RBC 3.17 (*)    Hemoglobin 10.0 (*)    HCT 29.5 (*)    Platelets 431 (*)    Neutro Abs 16.8 (*)    Monocytes Absolute 1.2 (*)    All other components within normal limits  RESP PANEL BY RT-PCR (RSV, FLU A&B, COVID)  RVPGX2  CULTURE, BLOOD (ROUTINE X 2)  CULTURE, BLOOD (ROUTINE X 2)  MRSA NEXT GEN BY PCR, NASAL   HCG, SERUM, QUALITATIVE  PROTIME-INR  I-STAT CG4 LACTIC ACID, ED  I-STAT CG4 LACTIC ACID, ED    EKG: EKG Interpretation Date/Time:  Thursday February 21 2024 23:11:39 EDT Ventricular Rate:  128 PR Interval:  131 QRS Duration:  74 QT Interval:  279 QTC Calculation: 408 R Axis:  55  Text Interpretation: duplicate, discard Confirmed by Raford Lenis ([id number]) on 02/21/2024 11:26:08 PM  Radiology: [facility] Chest Port 1 View Result Date: 02/21/2024 CLINICAL DATA:  Recent BBL, abscess on buttocks,fever and ? Of sepsis now, patient is prone due to pain Questionable sepsis - evaluate for abnormality EXAM: PORTABLE CHEST 1 VIEW COMPARISON:  Chest x-ray 11/09/2008 FINDINGS: Limited evaluation due to overlapping osseous structures and overlying soft tissues. The heart and mediastinal contours are within normal limits. No focal consolidation. No pulmonary edema. No pleural effusion. No pneumothorax. No acute osseous abnormality. IMPRESSION: No active disease. Limited evaluation due to overlapping osseous structures and overlying soft tissues. Consider repeat chest x-ray PA and lateral view for further evaluation. Electronically Signed   By: Morgane  Naveau M.D.   On: 02/21/2024 23:16   CT ABDOMEN PELVIS W CONTRAST Result Date: 02/21/2024 CLINICAL DATA:  Fever with dizziness, headache, fever, chills and nausea. EXAM: CT ABDOMEN AND PELVIS WITH CONTRAST TECHNIQUE: Multidetector CT imaging of the abdomen and pelvis was performed using the standard protocol following bolus administration of intravenous contrast. RADIATION DOSE REDUCTION: This exam was performed according to the departmental dose-optimization program which includes automated exposure control, adjustment of the mA and/or kV according to patient size and/or use of iterative reconstruction technique. CONTRAST:  OMNIPAQUE  IOHEXOL  300 MG/ML  SOLN COMPARISON:  October 25, 2021 FINDINGS: Lower chest: No acute abnormality. Hepatobiliary: No focal liver  abnormality is seen. Status post cholecystectomy. No biliary dilatation. Pancreas: Unremarkable. No pancreatic ductal dilatation or surrounding inflammatory changes. Spleen: Normal in size without focal abnormality. Adrenals/Urinary Tract: Adrenal glands are unremarkable. Kidneys are normal, without obstructing renal calculi or focal lesions. Prominent bilateral extrarenal pelvis are seen with mild to moderate severity right-sided hydroureter also noted. A 2 mm nonobstructing renal calculus is seen within the lower pole of the right kidney. 1 mm nonobstructing renal calculi are seen within the mid left kidney. Bladder is unremarkable. Stomach/Bowel: Stomach is within normal limits. Appendix appears normal. No evidence of bowel wall thickening, distention, or inflammatory changes. Vascular/Lymphatic: No significant vascular findings are present. No enlarged abdominal or pelvic lymph nodes. Reproductive: Uterus and bilateral adnexa are unremarkable. Other: No abdominal wall hernia or abnormality. No abdominopelvic ascites. Musculoskeletal: A 5.2 cm x 6.0 cm x 7.3 cm collection of fluid and air is seen within the subcutaneous fat along the posterolateral pelvic wall on the right. Moderate to marked severity surrounding inflammatory fat stranding is seen. Patchy, moderate severity areas of subcutaneous inflammatory fat stranding and subcentimeter foci of soft tissue air are seen inferior to this region. Diffuse mild to moderate severity inflammatory fat stranding is present throughout the remaining portions of the posterior, posterolateral and lateral pelvic walls, bilaterally. No acute osseous abnormalities are identified. IMPRESSION: 1. 5.2 cm x 6.0 cm x 7.3 cm abscess within the subcutaneous fat along the posterolateral pelvic wall on the right with additional areas of adjacent cellulitis and subcutaneous emphysema. 2. Bilateral nonobstructing renal calculi. 3. Evidence of prior cholecystectomy. Electronically Signed    By: Suzen Dials M.D.   On: 02/21/2024 00:15     .Critical Care  Performed by: Bobette Pleasant SAUNDERS, PA-C Authorized by: Bobette Pleasant SAUNDERS, PA-C   Critical care provider statement:    Critical care time (minutes):  45   Critical care was time spent personally by me on the following activities:  Development of treatment plan with patient or surrogate, discussions with consultants, evaluation of patient's response to treatment, examination of patient, obtaining history  from patient or surrogate, ordering and performing treatments and interventions, ordering and review of laboratory studies, ordering and review of radiographic studies, pulse oximetry and re-evaluation of patient's condition    Medications Ordered in the ED  lactated ringers  infusion ( Intravenous New Bag/Given 02/21/24 2350)  lactated ringers  bolus 1,000 mL (1,000 mLs Intravenous New Bag/Given 02/21/24 2310)    And  lactated ringers  bolus 1,000 mL (has no administration in time range)    And  lactated ringers  bolus 500 mL (has no administration in time range)  vancomycin  (VANCOCIN ) IVPB 1000 mg/200 mL premix (1,000 mg Intravenous New Bag/Given 02/21/24 2350)  lactated ringers  bolus 1,000 mL (has no administration in time range)  lactated ringers  bolus 1,000 mL (has no administration in time range)  acetaminophen  (TYLENOL ) tablet 1,000 mg (has no administration in time range)  HYDROcodone -acetaminophen  (NORCO/VICODIN) 5-325 MG per tablet 1-2 tablet (has no administration in time range)  HYDROmorphone  (DILAUDID ) injection 0.5-2 mg (has no administration in time range)  methocarbamol  (ROBAXIN ) injection 1,000 mg (has no administration in time range)  methocarbamol  (ROBAXIN ) tablet 1,000 mg (has no administration in time range)  ondansetron  (ZOFRAN ) injection 4 mg (has no administration in time range)  phenol (CHLORASEPTIC) mouth spray 2 spray (has no administration in time range)  menthol -cetylpyridinium (CEPACOL)  lozenge 3 mg (has no administration in time range)  magic mouthwash (has no administration in time range)  alum & mag hydroxide-simeth (MAALOX/MYLANTA) 200-200-20 MG/5ML suspension 30 mL (has no administration in time range)  simethicone  (MYLICON) 40 MG/0.6ML suspension 80 mg (has no administration in time range)  naphazoline-glycerin  (CLEAR EYES REDNESS) ophth solution 1-2 drop (has no administration in time range)  sodium chloride  (OCEAN) 0.65 % nasal spray 1-2 spray (has no administration in time range)  diphenhydrAMINE  (BENADRYL ) injection 12.5-25 mg (has no administration in time range)  metoprolol  tartrate (LOPRESSOR ) injection 5 mg (has no administration in time range)  LORazepam  (ATIVAN ) tablet 0.5 mg (has no administration in time range)  ceFAZolin  (ANCEF ) IVPB 2g/100 mL premix (has no administration in time range)  metroNIDAZOLE  (FLAGYL ) IVPB 500 mg (has no administration in time range)  Chlorhexidine  Gluconate Cloth 2 % PADS 6 each (has no administration in time range)    And  Chlorhexidine  Gluconate Cloth 2 % PADS 6 each (has no administration in time range)  ceFAZolin  (ANCEF ) IVPB 2g/100 mL premix (has no administration in time range)  gabapentin  (NEURONTIN ) capsule 300 mg (has no administration in time range)  acetaminophen  (TYLENOL ) tablet 1,000 mg (has no administration in time range)  celecoxib  (CELEBREX ) capsule 200 mg (has no administration in time range)  ondansetron  (ZOFRAN ) injection 4 mg (4 mg Intravenous Given 02/21/24 2322)  morphine  (PF) 4 MG/ML injection 4 mg (4 mg Intravenous Given 02/21/24 2322)    Clinical Course as of 02/22/24 0005  Thu Feb 21, 2024  2246 I discussed the patient's case with her surgeon Dr. Aisha, in [place], via phone. His direct number is [phone]. He agrees that given septic presentation patient will require admission; however he states he does not have inpatient privileges at any hospital that we could transfer the patient to. He is  requesting that I consult my surgical colleague, who he requests to call him directly for further discussion.  [RS]  2313 Consult to Dr. Sheldon, who was already consulted by plastic surgery. He has generously agreed to care for this patient and agrees with plan for IV antibiotics and medicine admission. He states that he will post the patient  for the OR for incision and drainage in the AM. I appreciate his collaboration in the care of this patient.  [RS]  Fri Feb 22, 2024  0004 Consul to Dr. Lee, hospitalist, who is agreeable to admitting this patient to her service. I appreciate her collaboration in the care of this patient.  [RS]    Clinical Course User Index [RS] Darrall Strey, Pleasant SAUNDERS, PA-C                                 Medical Decision Making 38 y/o female with post-op concern.   Septic presentation on arrival with tachycardia to the 110s to 120s.  Patient is erythema over the right superolateral buttock with exquisite tenderness palpation, warmth to the touch.  Patient is ill-appearing  Amount and/or Complexity of Data Reviewed External Data Reviewed: radiology.    Details: CT approximate 24 hours ago showed right buttock abscess of 5 x 6 x 7 cm. Labs: ordered.    Details: Leukocytosis of 19,000, anemic with hemoglobin of 10 near patient's baseline.  CMP with reassuring hepatic and renal function, normal electrolytes.  Lactic is normal at 0.8, pregnancy test is negative. Radiology: ordered.    Details:   Chest x-ray reassuring.  Risk OTC drugs. Prescription drug management. Decision regarding hospitalization.   Patient will require admission to the hospital given her septic presentation.  Unfortunately patient's performing surgeon does not have privileges at an inpatient facility therefore she will admitted to our hospital.  Fortunately her surgeon was able to coordinate with our surgical services and the patient will be taken to the OR by one of our surgeons in the morning for  incision and drainage for abscess.  IV antibiotics in the interim, fluid resuscitation.  Will make patient n.p.o. after midnight.  [name] Bass voiced understanding of her medical evaluation and treatment plan. Each of their questions answered to their expressed satisfaction.  She is amenable to plan for admission at this time.  This chart was dictated using voice recognition software, Dragon. Despite the best efforts of this provider to proofread and correct errors, errors may still occur which can change documentation meaning.     Final diagnoses:  Post-operative pain  Sepsis, due to unspecified organism, unspecified whether acute organ dysfunction present [facility])    ED Discharge Orders     None          Bobette Pleasant SAUNDERS, PA-C 02/21/24 2356    Dezaria Methot, Pleasant SAUNDERS, PA-C 02/22/24 0005    Laurice Maude BROCKS, MD 04/04/24 [phone]

## 2024-02-21 NOTE — ED Triage Notes (Signed)
 Pt had recent BBL in [place] and seen here for abscess in surgical site yesterday. Pt states that she is still having fever and the pain is worsening.

## 2024-02-21 NOTE — Consult Note (Addendum)
 [name] Bass  1986/07/17 [id number]  CARE TEAM:  PCP: Patient, No Pcp Per  Outpatient Care Team: Patient Care Team: Patient, No Pcp Per as PCP - General (General Practice)  Inpatient Treatment Team: Treatment Team:  Laurice Maude BROCKS, MD Con Leonette BIRCH, NT Theodoro Delrae PEDLAR, RN Sponseller, Pleasant SAUNDERS, PA-C Ccs, Md, MD   This patient is a 38 y.o.female who presents today for surgical evaluation at the request of [name] Bass, ED.SABRA   Chief complaint / Reason for evaluation: Persistent deep gluteal abscess  38 year old woman.  Underwent Sudan Butt Lift (BBL) procedure 02/05/2024 in [place] by a Engineer, petroleum there.  Dr Dallas Brunswick.  Pt has had pain and swelling and has had aspirations done in [place].  Had pain and came to the ER yesterday.  They called the plastic surgeon who does not have privileges at [facility] he asked incision and drainage stated aspiration.  She felt worse.  She came in.  Plastic surgeon called his friend the gastroenterologist who called me and called the ER to ask if general surgery cover since there is no plastic surgery coverage over the Holiday weekend.  Patient feeling worse with worsening pain.   Assessment  [name] Bass  38 y.o. female       Problem List:  Active Problems:   * No active hospital problems. *   Deep left buttock abscess status post Sudan butt lift injections by Engineer, petroleum in [place].  Plan:  I think this needs more formal drainage since she has failed aspirations x 2.  The plastic surgeon that did the procedure in [place] cannot do it.  He requested be done.  PRN (as needed) already done aspiration last night and do not feel comfortable with doing incision that deep.  There is no plastic surgery covering town so therefore he comes to general surgery to manage.  Place on IV antibiotics.  Will do cefazolin  and add metronidazole  for anaerobic coverage just in case since this has been going on for a  while.  MRSA screening  -  if positive then placed on vancomycin  as well.  Improve pain control.  IV fluid resuscitation.  Incision and drainage in the operating room.  I think this is too deep and sensitive to try and attempt at bedside since the abscess is several centimeters deep and going down to the gluteus muscles.  She is tachycardic but she is hypertensive with pain.  I do not think she has Fournier's gangrene/necrotizing fasciitis moring surgery in the [place].  Will do first thing in the morning.    I reviewed nursing notes, ED provider notes, last 24 h vitals and pain scores, last 48 h intake and output, last 24 h labs and trends, and last 24 h imaging results. I have reviewed this patient's available data, including medical history, events of note, test results, etc as part of my evaluation.  A significant portion of that time was spent in counseling.  Care during the described time interval was provided by me.  This care required moderate level of medical decision making.  02/21/2024  [name], MD, FACS, MASCRS Esophageal, Gastrointestinal & Colorectal Surgery Robotic and Minimally Invasive Surgery  [facility] [address] [place], [place] [address] [phone] Fax [phone] Main  CONTACT INFORMATION: Weekday (9AM-5PM): Call [facility] main office at [phone] Weeknight (5PM-9AM) or Weekend/Holiday: Check EPIC Web Links tab & use AMION (password  [email or web address]) for  General Surgery [facility] coverage  Please, DO NOT use SecureChat  (it is not reliable communication to reach operating surgeons & will lead to a delay in care).   Epic staff messaging available for outptient concerns needing 1-2 business day response.      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   History of kidney stones    Hx: UTI (urinary tract infection)    Hypertension    Infection    UTI   Irregular periods/menstrual cycles    Polycystic ovarian syndrome 05/08/05   Vaginal Pap smear, abnormal    Vaginitis and vulvovaginitis 04/29/04    Past Surgical History:  Procedure Laterality Date   Sudan Butt Lift     CHOLECYSTECTOMY, LAPAROSCOPIC  08/21/2008   CYSTOSCOPY WITH RETROGRADE PYELOGRAM, URETEROSCOPY AND STENT PLACEMENT Bilateral 01/17/2020   Procedure: CYSTOSCOPY WITH RETROGRADE PYELOGRAM, URETEROSCOPY bilateral right double j stent placement;  Surgeon: Sherrilee Belvie CROME, MD;  Location: [facility];  Service: Urology;  Laterality: Bilateral;  1 HR   CYSTOSCOPY WITH URETEROSCOPY  03/28/2012   Procedure: CYSTOSCOPY WITH URETEROSCOPY;  Surgeon: Garnette Shack, MD;  Location: [facility];  Service: Urology;  Laterality: Right;  RIGHT URETEROSCOPIC STONE EXTRACTION W/ LASER (Pt is [redacted] wk pregnant)     CYSTOSCOPY/URETEROSCOPY/HOLMIUM LASER/STENT PLACEMENT Left 10/20/2021   Procedure: CYSTOSCOPY LEFT RETROGRADE PYELOGRAM, URETERAL BALLOON DILATION, URETEROSCOPY/HOLMIUM LASER/STENT PLACEMENT;  Surgeon: Cam Morene ORN, MD;  Location: [facility];  Service: Urology;  Laterality: Left;   EXTRACORPOREAL SHOCK WAVE LITHOTRIPSY Left 11/09/2016   Procedure: EXTRACORPOREAL SHOCK WAVE LITHOTRIPSY (ESWL);  Surgeon: Morene ORN Cam, MD;  Location: [facility];  Service: Urology;  Laterality: Left;   HOLMIUM LASER APPLICATION Bilateral 01/17/2020   Procedure: HOLMIUM LASER APPLICATION;  Surgeon: Sherrilee Belvie CROME, MD;  Location: [facility];  Service: Urology;  Laterality: Bilateral;   STONE EXTRACTION WITH BASKET   03/28/2012   Procedure: STONE EXTRACTION WITH BASKET;  Surgeon: Garnette Shack, MD;  Location: [facility];  Service: Urology;  Laterality: Right;   TONSILLECTOMY  08/21/2004   TUBAL LIGATION  08/04/2012   Procedure: POST PARTUM TUBAL LIGATION;  Surgeon: Shanda SHAUNNA Muscat, MD;  Location: [facility];  Service: Gynecology;  Laterality: Bilateral;  Bilateral post partum tubal ligation   WISDOM TOOTH EXTRACTION  08/22/1999    Social History   Socioeconomic History   Marital status: Single    Spouse name: Not on file   Number of children: 3   Years of education: 12   Highest education level: Not on file  Occupational History   Not on file  Tobacco Use   Smoking status: Former   Smokeless tobacco: Never   Tobacco comments:    Jan 2015  Vaping Use   Vaping status: Never Used  Substance and Sexual Activity   Alcohol use: Yes    Alcohol/week: 0.0 standard drinks of alcohol    Comment: occasional   Drug use: No   Sexual activity: Yes    Partners: Male  Birth control/protection: Surgical  Other Topics Concern   Not on file  Social History Narrative   Not on file   Social Drivers of Health   Financial Resource Strain: Not on file  Food Insecurity: Not on file  Transportation Needs: Not on file  Physical Activity: Not on file  Stress: Not on file  Social Connections: Not on file  Intimate Partner Violence: Not on file    Family History  Problem Relation Age of Onset   Heart disease Father        heart attack in 49.  lm   Cancer Maternal Uncle        bone cancer   Diabetes Maternal Uncle    Cancer Maternal Grandmother        ovarian cancer   Suicidality Mother    Diabetes Maternal Aunt    Cancer Maternal Aunt        lung   Hypertension Paternal Uncle     Current Facility-Administered Medications  Medication Dose Route Frequency Provider Last Rate Last Admin   acetaminophen  (TYLENOL ) tablet 1,000 mg  1,000 mg Oral Once Sponseller, Rebekah R, PA-C       [START ON  02/22/2024] acetaminophen  (TYLENOL ) tablet 1,000 mg  1,000 mg Oral Q6H Izza Bickle, MD       [START ON 02/22/2024] acetaminophen  (TYLENOL ) tablet 1,000 mg  1,000 mg Oral On Call to OR Sheldon Standing, MD       alum & mag hydroxide-simeth (MAALOX/MYLANTA) 200-200-20 MG/5ML suspension 30 mL  30 mL Oral Q6H PRN Sheldon Standing, MD       ceFAZolin  (ANCEF ) IVPB 2g/100 mL premix  2 g Intravenous Q8H Buddie Marston, MD       [START ON 02/22/2024] ceFAZolin  (ANCEF ) IVPB 2g/100 mL premix  2 g Intravenous On Call to OR Sheldon Standing, MD       [START ON 02/22/2024] celecoxib  (CELEBREX ) capsule 200 mg  200 mg Oral On Call to OR Sheldon Standing, MD       Chlorhexidine  Gluconate Cloth 2 % PADS 6 each  6 each Topical Once Sheldon Standing, MD       And   Chlorhexidine  Gluconate Cloth 2 % PADS 6 each  6 each Topical Once Sheldon Standing, MD       diphenhydrAMINE  (BENADRYL ) injection 12.5-25 mg  12.5-25 mg Intravenous Q6H PRN Sheldon Standing, MD       NOREEN ON 02/22/2024] gabapentin  (NEURONTIN ) capsule 300 mg  300 mg Oral On Call to OR Sheldon Standing, MD       HYDROcodone -acetaminophen  (NORCO/VICODIN) 5-325 MG per tablet 1-2 tablet  1-2 tablet Oral Q4H PRN Sheldon Standing, MD       HYDROmorphone  (DILAUDID ) injection 0.5-2 mg  0.5-2 mg Intravenous Q2H PRN Sheldon Standing, MD       lactated ringers  bolus 1,000 mL  1,000 mL Intravenous Once Sponseller, Rebekah R, PA-C 2,000 mL/hr at 02/21/24 2310 1,000 mL at 02/21/24 2310   And   lactated ringers  bolus 1,000 mL  1,000 mL Intravenous Once Sponseller, Rebekah R, PA-C       And   lactated ringers  bolus 500 mL  500 mL Intravenous Once Sponseller, Rebekah R, PA-C       lactated ringers  bolus 1,000 mL  1,000 mL Intravenous Once Sheldon Standing, MD       lactated ringers  bolus 1,000 mL  1,000 mL Intravenous Q8H PRN Sheldon Standing, MD       lactated ringers  infusion   Intravenous Continuous Sponseller,  Pleasant SAUNDERS, PA-C       LORazepam  (ATIVAN ) tablet 0.5 mg  0.5 mg Oral Q6H PRN Sheldon Standing, MD        magic mouthwash  15 mL Oral QID PRN Sheldon Standing, MD       menthol -cetylpyridinium (CEPACOL) lozenge 3 mg  1 lozenge Oral PRN Sheldon Standing, MD       methocarbamol  (ROBAXIN ) injection 1,000 mg  1,000 mg Intravenous Q6H PRN Sheldon Standing, MD       methocarbamol  (ROBAXIN ) tablet 1,000 mg  1,000 mg Oral Q6H PRN Sheldon Standing, MD       metoprolol  tartrate (LOPRESSOR ) injection 5 mg  5 mg Intravenous Q6H PRN Sheldon Standing, MD       metroNIDAZOLE  (FLAGYL ) IVPB 500 mg  500 mg Intravenous Q6H Evann Erazo, MD       naphazoline-glycerin  (CLEAR EYES REDNESS) ophth solution 1-2 drop  1-2 drop Both Eyes QID PRN Sheldon Standing, MD       ondansetron  (ZOFRAN ) injection 4 mg  4 mg Intravenous Q6H PRN Sheldon Standing, MD       phenol (CHLORASEPTIC) mouth spray 2 spray  2 spray Mouth/Throat PRN Sheldon Standing, MD       simethicone  (MYLICON) 40 MG/0.6ML suspension 80 mg  80 mg Oral QID PRN Sheldon Standing, MD       sodium chloride  (OCEAN) 0.65 % nasal spray 1-2 spray  1-2 spray Each Nare Q6H PRN Sheldon Standing, MD       vancomycin  (VANCOCIN ) IVPB 1000 mg/200 mL premix  1,000 mg Intravenous Once Sponseller, Rebekah R, PA-C       Current Outpatient Medications  Medication Sig Dispense Refill   Ibuprofen  200 MG CAPS Take 200-800 mg by mouth every 6 (six) hours as needed (for pain).     sulfamethoxazole -trimethoprim  (BACTRIM  DS) 800-160 MG tablet Take 1 tablet by mouth 2 (two) times daily.     TYLENOL  325 MG tablet Take 650 mg by mouth every 6 (six) hours as needed (for pain).     naproxen  (NAPROSYN ) 500 MG tablet Take 1 tablet (500 mg total) by mouth 2 (two) times daily as needed for moderate pain. (Patient not taking: Reported on 02/21/2024) 30 tablet 0   ondansetron  (ZOFRAN ) 4 MG tablet Take 1 tablet (4 mg total) by mouth every 6 (six) hours. (Patient not taking: Reported on 02/21/2024) 12 tablet 0   oxyCODONE -acetaminophen  (PERCOCET/ROXICET) 5-325 MG tablet Take 1 tablet by mouth every 6 (six) hours as needed for severe  pain. (Patient not taking: Reported on 02/21/2024) 10 tablet 0   phenazopyridine  (PYRIDIUM ) 200 MG tablet Take 1 tablet (200 mg total) by mouth 3 (three) times daily as needed for pain. (Patient not taking: Reported on 02/21/2024) 10 tablet 0   senna-docusate (SENOKOT-S) 8.6-50 MG tablet Take 1 tablet by mouth at bedtime as needed for mild constipation or moderate constipation. (Patient not taking: Reported on 02/21/2024) 30 tablet 0   traMADol  (ULTRAM ) 50 MG tablet Take 1-2 tablets (50-100 mg total) by mouth every 6 (six) hours as needed for moderate pain. (Patient not taking: Reported on 02/21/2024) 15 tablet 0     Allergies  Allergen Reactions   Amoxicillin Rash and Other (See Comments)    Has patient had a PCN reaction causing immediate rash, facial/tongue/throat swelling, SOB or lightheadedness with hypotension: No Has patient had a PCN reaction causing severe rash involving mucus membranes or skin necrosis: No Has patient had a PCN reaction that required hospitalization No Has patient  had a PCN reaction occurring within the last 10 years: No If all of the above answers are NO, then may proceed with Cephalosporin use.   Egg-Derived Products Rash     BP (!) 138/98   Pulse (!) 116   Temp 99.6 F (37.6 C)   Resp 20   Ht 5' 10 (1.778 m)   Wt 79.4 kg   SpO2 97%   BMI 25.11 kg/m     Results:   Labs: Results for orders placed or performed during the hospital encounter of 02/21/24 (from the past 48 hours)  I-Stat Lactic Acid, ED     Status: None   Collection Time: 02/21/24 11:07 PM  Result Value Ref Range   Lactic Acid, Venous 0.8 0.5 - 1.9 mmol/L  CBC with Differential     Status: Abnormal   Collection Time: 02/21/24 11:08 PM  Result Value Ref Range   WBC 19.5 (H) 4.0 - 10.5 K/uL   RBC 3.17 (L) 3.87 - 5.11 MIL/uL   Hemoglobin 10.0 (L) 12.0 - 15.0 g/dL   HCT 70.4 (L) 63.9 - 53.9 %   MCV 93.1 80.0 - 100.0 fL   MCH 31.5 26.0 - 34.0 pg   MCHC 33.9 30.0 - 36.0 g/dL   RDW 88.0  88.4 - 84.4 %   Platelets 431 (H) 150 - 400 K/uL   nRBC 0.0 0.0 - 0.2 %   Neutrophils Relative % 87 %   Neutro Abs 16.8 (H) 1.7 - 7.7 K/uL   Lymphocytes Relative 5 %   Lymphs Abs 1.0 0.7 - 4.0 K/uL   Monocytes Relative 6 %   Monocytes Absolute 1.2 (H) 0.1 - 1.0 K/uL   Eosinophils Relative 1 %   Eosinophils Absolute 0.3 0.0 - 0.5 K/uL   Basophils Relative 1 %   Basophils Absolute 0.1 0.0 - 0.1 K/uL   Immature Granulocytes 0 %   Abs Immature Granulocytes 0.07 0.00 - 0.07 K/uL    Comment: Performed at [facility], [address], [place], [place] [address]    Imaging / Studies: DG Chest Port 1 View Result Date: 02/21/2024 CLINICAL DATA:  Recent BBL, abscess on buttocks,fever and ? Of sepsis now, patient is prone due to pain Questionable sepsis - evaluate for abnormality EXAM: PORTABLE CHEST 1 VIEW COMPARISON:  Chest x-ray 11/09/2008 FINDINGS: Limited evaluation due to overlapping osseous structures and overlying soft tissues. The heart and mediastinal contours are within normal limits. No focal consolidation. No pulmonary edema. No pleural effusion. No pneumothorax. No acute osseous abnormality. IMPRESSION: No active disease. Limited evaluation due to overlapping osseous structures and overlying soft tissues. Consider repeat chest x-ray PA and lateral view for further evaluation. Electronically Signed   By: Morgane  Naveau M.D.   On: 02/21/2024 23:16   CT ABDOMEN PELVIS W CONTRAST Result Date: 02/21/2024 CLINICAL DATA:  Fever with dizziness, headache, fever, chills and nausea. EXAM: CT ABDOMEN AND PELVIS WITH CONTRAST TECHNIQUE: Multidetector CT imaging of the abdomen and pelvis was performed using the standard protocol following bolus administration of intravenous contrast. RADIATION DOSE REDUCTION: This exam was performed according to the departmental dose-optimization program which includes automated exposure control, adjustment of the mA and/or kV according to patient size  and/or use of iterative reconstruction technique. CONTRAST:  OMNIPAQUE  IOHEXOL  300 MG/ML  SOLN COMPARISON:  October 25, 2021 FINDINGS: Lower chest: No acute abnormality. Hepatobiliary: No focal liver abnormality is seen. Status post cholecystectomy. No biliary dilatation. Pancreas: Unremarkable. No pancreatic ductal dilatation or  surrounding inflammatory changes. Spleen: Normal in size without focal abnormality. Adrenals/Urinary Tract: Adrenal glands are unremarkable. Kidneys are normal, without obstructing renal calculi or focal lesions. Prominent bilateral extrarenal pelvis are seen with mild to moderate severity right-sided hydroureter also noted. A 2 mm nonobstructing renal calculus is seen within the lower pole of the right kidney. 1 mm nonobstructing renal calculi are seen within the mid left kidney. Bladder is unremarkable. Stomach/Bowel: Stomach is within normal limits. Appendix appears normal. No evidence of bowel wall thickening, distention, or inflammatory changes. Vascular/Lymphatic: No significant vascular findings are present. No enlarged abdominal or pelvic lymph nodes. Reproductive: Uterus and bilateral adnexa are unremarkable. Other: No abdominal wall hernia or abnormality. No abdominopelvic ascites. Musculoskeletal: A 5.2 cm x 6.0 cm x 7.3 cm collection of fluid and air is seen within the subcutaneous fat along the posterolateral pelvic wall on the right. Moderate to marked severity surrounding inflammatory fat stranding is seen. Patchy, moderate severity areas of subcutaneous inflammatory fat stranding and subcentimeter foci of soft tissue air are seen inferior to this region. Diffuse mild to moderate severity inflammatory fat stranding is present throughout the remaining portions of the posterior, posterolateral and lateral pelvic walls, bilaterally. No acute osseous abnormalities are identified. IMPRESSION: 1. 5.2 cm x 6.0 cm x 7.3 cm abscess within the subcutaneous fat along the  posterolateral pelvic wall on the right with additional areas of adjacent cellulitis and subcutaneous emphysema. 2. Bilateral nonobstructing renal calculi. 3. Evidence of prior cholecystectomy. Electronically Signed   By: Suzen Dials M.D.   On: 02/21/2024 00:15    Medications / Allergies: per chart  Antibiotics: Anti-infectives (From admission, onward)    Start     Dose/Rate Route Frequency Ordered Stop   02/22/24 0600  ceFAZolin  (ANCEF ) IVPB 2g/100 mL premix        2 g 200 mL/hr over 30 Minutes Intravenous On call to O.R. 02/21/24 2326 02/23/24 0559   02/21/24 2330  ceFAZolin  (ANCEF ) IVPB 2g/100 mL premix        2 g 200 mL/hr over 30 Minutes Intravenous Every 8 hours 02/21/24 2323     02/21/24 2330  metroNIDAZOLE  (FLAGYL ) IVPB 500 mg        500 mg 100 mL/hr over 60 Minutes Intravenous Every 6 hours 02/21/24 2323     02/21/24 2245  ceFEPIme  (MAXIPIME ) 2 g in sodium chloride  0.9 % 100 mL IVPB  Status:  Discontinued        2 g 200 mL/hr over 30 Minutes Intravenous  Once 02/21/24 2242 02/21/24 2323   02/21/24 2245  vancomycin  (VANCOCIN ) IVPB 1000 mg/200 mL premix        1,000 mg 200 mL/hr over 60 Minutes Intravenous  Once 02/21/24 2242           Note: Portions of this report may have been transcribed using voice recognition software. Every effort was made to ensure accuracy; however, inadvertent computerized transcription errors may be present.   Any transcriptional errors that result from this process are unintentional.    [name], MD, FACS, MASCRS Esophageal, Gastrointestinal & Colorectal Surgery Robotic and Minimally Invasive Surgery  [facility] [address] [place], [place] [address] [phone] Fax [phone] Main  CONTACT INFORMATION: Weekday (9AM-5PM): Call [facility] main office at [phone] Weeknight (5PM-9AM) or Weekend/Holiday: Check EPIC Web Links tab & use AMION (password   [email or web address]) for General Surgery [facility] coverage  Please, DO NOT use SecureChat  (  it is not reliable communication to reach operating surgeons & will lead to a delay in care).   Epic staff messaging available for outptient concerns needing 1-2 business day response.       02/21/2024  11:27 PM

## 2024-02-21 NOTE — ED Provider Notes (Incomplete Revision)
 [name] Bass EMERGENCY DEPARTMENT AT [facility] Provider Note   CSN: [id number] Arrival date & time: 02/21/24  2054     Patient presents with: Post-op Problem and Fever   [name] Bass is a 38 y.o. female who presents to the Emergency Department tonight with concern for worsening fever, persistent pain in her postoperative site, pod 16.  Patient had presented but left with Dr. Aisha at an outpatient surgical center in [place] on 02/05/2024.  5 days ago patient began with pain in the right buttock and fevers.  She was seen by her surgeon who  reportedly aspirated an area on her right buttock and started her on Bactrim  but she presented to the ER yesterday with persistent symptoms.  She was found to SIRS criteria in the ED yesterday with concern for suspected cellulitis and question of abscess in the right buttock which was confirmed on CT imaging Which identified a 5 x 6 x 7 cm abscess within the subcutaneous fat on the posterior lateral pelvic wall on the right with adjacent cellulitis and subcu emphysema.  After discussion with the patient surgeon ED providers last night aspirated 10 cc of purulent discharge from the area.  The area was felt to be too deep to safely be dissected in the emergency department and the patient was referred back to her surgeon.  She states that he will not see her in the office until 7/5.  She presented to the ED today with increased fever and weakness.   HPI     Prior to Admission medications   Medication Sig Start Date End Date Taking? Authorizing Provider  Ibuprofen  200 MG CAPS Take 200-800 mg by mouth every 6 (six) hours as needed (for pain).   Yes [provider]  sulfamethoxazole -trimethoprim  (BACTRIM  DS) 800-160 MG tablet Take 1 tablet by mouth 2 (two) times daily.   Yes [provider]  TYLENOL  325 MG tablet Take 650 mg by mouth every 6 (six) hours as needed (for pain).   Yes [provider]  naproxen  (NAPROSYN ) 500  MG tablet Take 1 tablet (500 mg total) by mouth 2 (two) times daily as needed for moderate pain. Patient not taking: Reported on 02/21/2024 01/22/20   Schuyler Charlie RAMAN, MD  ondansetron  (ZOFRAN ) 4 MG tablet Take 1 tablet (4 mg total) by mouth every 6 (six) hours. Patient not taking: Reported on 02/21/2024 10/05/21   Long, Fonda MATSU, MD  oxyCODONE -acetaminophen  (PERCOCET/ROXICET) 5-325 MG tablet Take 1 tablet by mouth every 6 (six) hours as needed for severe pain. Patient not taking: Reported on 02/21/2024 10/25/21   Conklin, Erica R, PA-C  phenazopyridine  (PYRIDIUM ) 200 MG tablet Take 1 tablet (200 mg total) by mouth 3 (three) times daily as needed for pain. Patient not taking: Reported on 02/21/2024 10/20/21   Cam Morene ORN, MD  senna-docusate (SENOKOT-S) 8.6-50 MG tablet Take 1 tablet by mouth at bedtime as needed for mild constipation or moderate constipation. Patient not taking: Reported on 02/21/2024 10/05/21   Long, Fonda MATSU, MD  traMADol  (ULTRAM ) 50 MG tablet Take 1-2 tablets (50-100 mg total) by mouth every 6 (six) hours as needed for moderate pain. Patient not taking: Reported on 02/21/2024 10/20/21   Cam Morene ORN, MD    Allergies: Amoxicillin and Egg-derived products    Review of Systems  Constitutional:  Positive for appetite change, chills, fatigue and fever.  Musculoskeletal:        Pain in the R buttock    Updated Vital Signs BP ROLLEN)  138/98   Pulse (!) 116   Temp 99.6 F (37.6 C)   Resp 20   Ht 5' 10 (1.778 m)   Wt 79.4 kg   SpO2 97%   BMI 25.11 kg/m   Physical Exam Vitals and nursing note reviewed.  Constitutional:      Appearance: She is not ill-appearing or toxic-appearing.  HENT:     Head: Normocephalic and atraumatic.     Mouth/Throat:     Mouth: Mucous membranes are moist.     Pharynx: No oropharyngeal exudate or posterior oropharyngeal erythema.  Eyes:     General:        Right eye: No discharge.        Left eye: No discharge.     Extraocular Movements:  Extraocular movements intact.     Conjunctiva/sclera: Conjunctivae normal.     Pupils: Pupils are equal, round, and reactive to light.  Cardiovascular:     Rate and Rhythm: Regular rhythm. Tachycardia present.     Pulses: Normal pulses.     Heart sounds: Normal heart sounds. No murmur heard. Pulmonary:     Effort: Pulmonary effort is normal. No respiratory distress.     Breath sounds: Normal breath sounds. No wheezing or rales.  Abdominal:     General: Bowel sounds are normal. There is no distension.     Palpations: Abdomen is soft.     Tenderness: There is generalized abdominal tenderness. There is no right CVA tenderness, left CVA tenderness, guarding or rebound.   Musculoskeletal:        General: No deformity.     Cervical back: Neck supple.     Right lower leg: No edema.     Left lower leg: No edema.       Legs:  Skin:    General: Skin is warm and dry.     Capillary Refill: Capillary refill takes less than 2 seconds.  Neurological:     General: No focal deficit present.     Mental Status: She is alert and oriented to person, place, and time. Mental status is at baseline.  Psychiatric:        Mood and Affect: Mood normal.    (all labs ordered are listed, but only abnormal results are displayed) Labs Reviewed  COMPREHENSIVE METABOLIC PANEL WITH GFR - Abnormal; Notable for the following components:      Result Value   CO2 21 (*)    Glucose, Bld 119 (*)    Calcium  8.6 (*)    Total Protein 6.3 (*)    Albumin 3.1 (*)    All other components within normal limits  CBC WITH DIFFERENTIAL/PLATELET - Abnormal; Notable for the following components:   WBC 19.5 (*)    RBC 3.17 (*)    Hemoglobin 10.0 (*)    HCT 29.5 (*)    Platelets 431 (*)    Neutro Abs 16.8 (*)    Monocytes Absolute 1.2 (*)    All other components within normal limits  RESP PANEL BY RT-PCR (RSV, FLU A&B, COVID)  RVPGX2  CULTURE, BLOOD (ROUTINE X 2)  CULTURE, BLOOD (ROUTINE X 2)  MRSA NEXT GEN BY PCR, NASAL   HCG, SERUM, QUALITATIVE  PROTIME-INR  I-STAT CG4 LACTIC ACID, ED  I-STAT CG4 LACTIC ACID, ED    EKG: EKG Interpretation Date/Time:  Thursday February 21 2024 23:11:39 EDT Ventricular Rate:  128 PR Interval:  131 QRS Duration:  74 QT Interval:  279 QTC Calculation: 408 R Axis:  55  Text Interpretation: duplicate, discard Confirmed by Raford Lenis ([id number]) on 02/21/2024 11:26:08 PM  Radiology: [facility] Chest Port 1 View Result Date: 02/21/2024 CLINICAL DATA:  Recent BBL, abscess on buttocks,fever and ? Of sepsis now, patient is prone due to pain Questionable sepsis - evaluate for abnormality EXAM: PORTABLE CHEST 1 VIEW COMPARISON:  Chest x-ray 11/09/2008 FINDINGS: Limited evaluation due to overlapping osseous structures and overlying soft tissues. The heart and mediastinal contours are within normal limits. No focal consolidation. No pulmonary edema. No pleural effusion. No pneumothorax. No acute osseous abnormality. IMPRESSION: No active disease. Limited evaluation due to overlapping osseous structures and overlying soft tissues. Consider repeat chest x-ray PA and lateral view for further evaluation. Electronically Signed   By: Morgane  Naveau M.D.   On: 02/21/2024 23:16   CT ABDOMEN PELVIS W CONTRAST Result Date: 02/21/2024 CLINICAL DATA:  Fever with dizziness, headache, fever, chills and nausea. EXAM: CT ABDOMEN AND PELVIS WITH CONTRAST TECHNIQUE: Multidetector CT imaging of the abdomen and pelvis was performed using the standard protocol following bolus administration of intravenous contrast. RADIATION DOSE REDUCTION: This exam was performed according to the departmental dose-optimization program which includes automated exposure control, adjustment of the mA and/or kV according to patient size and/or use of iterative reconstruction technique. CONTRAST:  OMNIPAQUE  IOHEXOL  300 MG/ML  SOLN COMPARISON:  October 25, 2021 FINDINGS: Lower chest: No acute abnormality. Hepatobiliary: No focal liver  abnormality is seen. Status post cholecystectomy. No biliary dilatation. Pancreas: Unremarkable. No pancreatic ductal dilatation or surrounding inflammatory changes. Spleen: Normal in size without focal abnormality. Adrenals/Urinary Tract: Adrenal glands are unremarkable. Kidneys are normal, without obstructing renal calculi or focal lesions. Prominent bilateral extrarenal pelvis are seen with mild to moderate severity right-sided hydroureter also noted. A 2 mm nonobstructing renal calculus is seen within the lower pole of the right kidney. 1 mm nonobstructing renal calculi are seen within the mid left kidney. Bladder is unremarkable. Stomach/Bowel: Stomach is within normal limits. Appendix appears normal. No evidence of bowel wall thickening, distention, or inflammatory changes. Vascular/Lymphatic: No significant vascular findings are present. No enlarged abdominal or pelvic lymph nodes. Reproductive: Uterus and bilateral adnexa are unremarkable. Other: No abdominal wall hernia or abnormality. No abdominopelvic ascites. Musculoskeletal: A 5.2 cm x 6.0 cm x 7.3 cm collection of fluid and air is seen within the subcutaneous fat along the posterolateral pelvic wall on the right. Moderate to marked severity surrounding inflammatory fat stranding is seen. Patchy, moderate severity areas of subcutaneous inflammatory fat stranding and subcentimeter foci of soft tissue air are seen inferior to this region. Diffuse mild to moderate severity inflammatory fat stranding is present throughout the remaining portions of the posterior, posterolateral and lateral pelvic walls, bilaterally. No acute osseous abnormalities are identified. IMPRESSION: 1. 5.2 cm x 6.0 cm x 7.3 cm abscess within the subcutaneous fat along the posterolateral pelvic wall on the right with additional areas of adjacent cellulitis and subcutaneous emphysema. 2. Bilateral nonobstructing renal calculi. 3. Evidence of prior cholecystectomy. Electronically Signed    By: Suzen Dials M.D.   On: 02/21/2024 00:15     .Critical Care  Performed by: Bobette Pleasant SAUNDERS, PA-C Authorized by: Bobette Pleasant SAUNDERS, PA-C   Critical care provider statement:    Critical care time (minutes):  45   Critical care was time spent personally by me on the following activities:  Development of treatment plan with patient or surrogate, discussions with consultants, evaluation of patient's response to treatment, examination of patient, obtaining history  from patient or surrogate, ordering and performing treatments and interventions, ordering and review of laboratory studies, ordering and review of radiographic studies, pulse oximetry and re-evaluation of patient's condition    Medications Ordered in the ED  lactated ringers  infusion ( Intravenous New Bag/Given 02/21/24 2350)  lactated ringers  bolus 1,000 mL (1,000 mLs Intravenous New Bag/Given 02/21/24 2310)    And  lactated ringers  bolus 1,000 mL (has no administration in time range)    And  lactated ringers  bolus 500 mL (has no administration in time range)  vancomycin (VANCOCIN) IVPB 1000 mg/200 mL premix (1,000 mg Intravenous New Bag/Given 02/21/24 2350)  lactated ringers  bolus 1,000 mL (has no administration in time range)  lactated ringers  bolus 1,000 mL (has no administration in time range)  acetaminophen  (TYLENOL ) tablet 1,000 mg (has no administration in time range)  HYDROcodone -acetaminophen  (NORCO/VICODIN) 5-325 MG per tablet 1-2 tablet (has no administration in time range)  HYDROmorphone  (DILAUDID ) injection 0.5-2 mg (has no administration in time range)  methocarbamol (ROBAXIN) injection 1,000 mg (has no administration in time range)  methocarbamol (ROBAXIN) tablet 1,000 mg (has no administration in time range)  ondansetron  (ZOFRAN ) injection 4 mg (has no administration in time range)  phenol (CHLORASEPTIC) mouth spray 2 spray (has no administration in time range)  menthol -cetylpyridinium (CEPACOL)  lozenge 3 mg (has no administration in time range)  magic mouthwash (has no administration in time range)  alum & mag hydroxide-simeth (MAALOX/MYLANTA) 200-200-20 MG/5ML suspension 30 mL (has no administration in time range)  simethicone  (MYLICON) 40 MG/0.6ML suspension 80 mg (has no administration in time range)  naphazoline-glycerin  (CLEAR EYES REDNESS) ophth solution 1-2 drop (has no administration in time range)  sodium chloride  (OCEAN) 0.65 % nasal spray 1-2 spray (has no administration in time range)  diphenhydrAMINE  (BENADRYL ) injection 12.5-25 mg (has no administration in time range)  metoprolol tartrate (LOPRESSOR) injection 5 mg (has no administration in time range)  LORazepam (ATIVAN) tablet 0.5 mg (has no administration in time range)  ceFAZolin  (ANCEF ) IVPB 2g/100 mL premix (has no administration in time range)  metroNIDAZOLE  (FLAGYL ) IVPB 500 mg (has no administration in time range)  Chlorhexidine  Gluconate Cloth 2 % PADS 6 each (has no administration in time range)    And  Chlorhexidine  Gluconate Cloth 2 % PADS 6 each (has no administration in time range)  ceFAZolin  (ANCEF ) IVPB 2g/100 mL premix (has no administration in time range)  gabapentin (NEURONTIN) capsule 300 mg (has no administration in time range)  acetaminophen  (TYLENOL ) tablet 1,000 mg (has no administration in time range)  celecoxib (CELEBREX) capsule 200 mg (has no administration in time range)  ondansetron  (ZOFRAN ) injection 4 mg (4 mg Intravenous Given 02/21/24 2322)  morphine  (PF) 4 MG/ML injection 4 mg (4 mg Intravenous Given 02/21/24 2322)    Clinical Course as of 02/21/24 2356  Thu Feb 21, 2024  2246 I discussed the patient's case with her surgeon Dr. Aisha, in [place], via phone. His direct number is [phone]. He agrees that given septic presentation patient will require admission; however he states he does not have inpatient privileges at any hospital that we could transfer the patient to. He is  requesting that I consult my surgical colleague, who he requests to call him directly for further discussion.  [RS]  2313 Consult to Dr. Sheldon, who was already consulted by plastic surgery. He has generously agreed to care for this patient and agrees with plan for IV antibiotics and medicine admission. He states that he will post the patient  for the OR for incision and drainage in the AM. I appreciate his collaboration in the care of this patient.  [RS]    Clinical Course User Index [RS] Ayanni Tun, Pleasant SAUNDERS, PA-C                                 Medical Decision Making 38 y/o female with post-op concern.   Septic presentation on arrival with tachycardia to the 110s to 120s.  Patient is erythema over the right superolateral buttock with exquisite tenderness palpation, warmth to the touch.  Patient is ill-appearing  Amount and/or Complexity of Data Reviewed External Data Reviewed: radiology.    Details: CT approximate 24 hours ago showed right buttock abscess of 5 x 6 x 7 cm. Labs: ordered.    Details: Leukocytosis of 19,000, anemic with hemoglobin of 10 near patient's baseline.  CMP with reassuring hepatic and renal function, normal electrolytes.  Lactic is normal at 0.8, pregnancy test is negative. Radiology: ordered.    Details:   Chest x-ray reassuring.  Risk OTC drugs. Prescription drug management. Decision regarding hospitalization.   Patient will require admission to the hospital given her septic presentation.  Unfortunately patient's performing surgeon does not have privileges at an inpatient facility therefore she will admitted to our hospital.  Fortunately her surgeon was able to coordinate with our surgical services and the patient will be taken to the OR by one of our surgeons in the morning for incision and drainage for abscess.  IV antibiotics in the interim, fluid resuscitation.  Will make patient n.p.o. after midnight.  Brandye voiced understanding of her medical evaluation  and treatment plan. Each of their questions answered to their expressed satisfaction.  She is amenable to plan for admission at this time.  This chart was dictated using voice recognition software, Dragon. Despite the best efforts of this provider to proofread and correct errors, errors may still occur which can change documentation meaning.     Final diagnoses:  Post-operative pain  Sepsis, due to unspecified organism, unspecified whether acute organ dysfunction present [facility])    ED Discharge Orders     None          Bobette Pleasant SAUNDERS, PA-C 02/21/24 2356

## 2024-02-21 NOTE — ED Provider Notes (Signed)
  [place] EMERGENCY DEPARTMENT AT [facility] Provider Note      Procedures Aspiration of blood/fluid  Date/Time: 02/21/2024 1:37 AM  Performed by: Vicky Charleston, PA-C Authorized by: Vicky Charleston, PA-C  Consent: Verbal consent obtained Risks and benefits: risks, benefits and alternatives were discussed Consent given by: patient Patient understanding: patient states understanding of the procedure being performed Patient consent: the patient's understanding of the procedure matches consent given Procedure consent: procedure consent matches procedure scheduled Relevant documents: relevant documents present and verified Test results: test results available and properly labeled Site marked: the operative site was marked Imaging studies: imaging studies available Required items: required blood products, implants, devices, and special equipment available Patient identity confirmed: verbally with patient Time out: Immediately prior to procedure a time out was called to verify the correct patient, procedure, equipment, support staff and site/side marked as required. Local anesthesia used: yes Anesthesia: local infiltration  Anesthesia: Local anesthesia used: yes Local Anesthetic: lidocaine  1% with epinephrine  Anesthetic total: 10 mL  Sedation: Patient sedated: no  Patient tolerance: patient tolerated the procedure well with no immediate complications Comments: 10 cc of purulent discharge aspirated by me and Dr. Bari.        Vicky Charleston, PA-C 02/21/24 [id number]    Bari Charmaine FALCON, MD 02/21/24 [id number]    Bari Charmaine FALCON, MD 02/21/24 [phone]

## 2024-02-21 NOTE — H&P (View-Only) (Signed)
 [name] Bass  [birth year] [id number]  CARE TEAM:  PCP: Patient, No Pcp Per  Outpatient Care Team: Patient Care Team: Patient, No Pcp Per as PCP - General (General Practice)  Inpatient Treatment Team: Treatment Team:  Laurice Maude BROCKS, MD Con Leonette BIRCH, NT Theodoro Delrae PEDLAR, RN Sponseller, Pleasant SAUNDERS, PA-C Ccs, Md, MD   This patient is a 38 y.o.female who presents today for surgical evaluation at the request of [name], ED.SABRA   Chief complaint / Reason for evaluation: Persistent deep gluteal abscess  38 year old woman.  Underwent Brazilian butt lift injections in [place] by a Engineer, petroleum there.  Has had pain and swelling and has had aspirations done in [place].  Had pain and came to the ER yesterday.  They called the plastic surgeon who does not have privileges at [facility] he asked incision and drainage stated aspiration.  She felt worse.  She came in.  Plastic surgeon called his friend the gastroenterologist who called me and called the ER to ask if general surgery cover since there is no plastic surgery coverage tonight.  Patient feeling well with worsening pain.   Assessment  [name] Bass  38 y.o. female       Problem List:  Active Problems:   * No active hospital problems. *   Deep left buttock abscess status post [place] butt lift injections by Engineer, petroleum in [place].  Plan:  I think this needs more formal drainage since she has failed aspirations x 2.  The plastic surgeon that did the procedure in [place] cannot do it.  He requested be done.  PRN (as needed) already done aspiration last night and do not feel comfortable with doing incision that deep.  There is no plastic surgery covering town so therefore he comes to general surgery to manage.  Place on IV antibiotics.  Will do cefazolin  and add metronidazole  for anaerobic coverage just in case since this has been going on for a while.  MRSA screening  -  if positive then placed on vancomycin   as well.  Improve pain control.  IV fluid resuscitation.  Incision and drainage in the operating room.  I think this is too deep and sensitive to try and attempt at bedside since the abscess is several centimeters deep and going down to the gluteus muscles.  She is tachycardic but she is hypertensive with pain.  I do not think she has Fournier's gangrene/necrotizing fasciitis moring surgery in the [place].  Will do first thing in the morning.    I reviewed nursing notes, ED provider notes, last 24 h vitals and pain scores, last 48 h intake and output, last 24 h labs and trends, and last 24 h imaging results. I have reviewed this patient's available data, including medical history, events of note, test results, etc as part of my evaluation.  A significant portion of that time was spent in counseling.  Care during the described time interval was provided by me.  This care required moderate level of medical decision making.  02/21/2024  [name], MD, FACS, MASCRS Esophageal, Gastrointestinal & Colorectal Surgery Robotic and Minimally Invasive Surgery  [facility] [address], [address] [place], [place] [address] [phone] Fax [phone] Main  CONTACT INFORMATION: Weekday (9AM-5PM): Call [facility] main office at [phone] Weeknight (5PM-9AM) or Weekend/Holiday: Check EPIC Web Links tab & use AMION (password  [email or web address]) for General Surgery [facility] coverage  Please, DO NOT use SecureChat  (  it is not reliable communication to reach operating surgeons & will lead to a delay in care).   Epic staff messaging available for outptient concerns needing 1-2 business day response.      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   History of kidney stones    Hx: UTI (urinary tract infection)    Hypertension    Infection    UTI   Irregular periods/menstrual cycles    Polycystic ovarian syndrome 05/08/05   Vaginal Pap smear, abnormal    Vaginitis and vulvovaginitis 04/29/04    Past Surgical History:  Procedure Laterality Date   [place] Butt Lift     CHOLECYSTECTOMY, LAPAROSCOPIC  08/21/2008   CYSTOSCOPY WITH RETROGRADE PYELOGRAM, URETEROSCOPY AND STENT PLACEMENT Bilateral 01/17/2020   Procedure: CYSTOSCOPY WITH RETROGRADE PYELOGRAM, URETEROSCOPY bilateral right double j stent placement;  Surgeon: Sherrilee Belvie CROME, MD;  Location: [facility];  Service: Urology;  Laterality: Bilateral;  1 HR   CYSTOSCOPY WITH URETEROSCOPY  03/28/2012   Procedure: CYSTOSCOPY WITH URETEROSCOPY;  Surgeon: Garnette Shack, MD;  Location: [facility];  Service: Urology;  Laterality: Right;  RIGHT URETEROSCOPIC STONE EXTRACTION W/ LASER (Pt is [redacted] wk pregnant)     CYSTOSCOPY/URETEROSCOPY/HOLMIUM LASER/STENT PLACEMENT Left 10/20/2021   Procedure: CYSTOSCOPY LEFT RETROGRADE PYELOGRAM, URETERAL BALLOON DILATION, URETEROSCOPY/HOLMIUM LASER/STENT PLACEMENT;  Surgeon: Cam Morene ORN, MD;  Location: [facility];  Service: Urology;  Laterality: Left;   EXTRACORPOREAL SHOCK WAVE LITHOTRIPSY Left 11/09/2016   Procedure: EXTRACORPOREAL SHOCK WAVE LITHOTRIPSY (ESWL);  Surgeon: Morene ORN Cam, MD;  Location: [facility];  Service: Urology;  Laterality: Left;   HOLMIUM LASER APPLICATION Bilateral 01/17/2020   Procedure: HOLMIUM LASER APPLICATION;  Surgeon: Sherrilee Belvie CROME, MD;  Location: [facility];  Service: Urology;  Laterality: Bilateral;   STONE EXTRACTION WITH BASKET  03/28/2012   Procedure: STONE EXTRACTION WITH BASKET;  Surgeon: Garnette Shack,  MD;  Location: [facility];  Service: Urology;  Laterality: Right;   TONSILLECTOMY  08/21/2004   TUBAL LIGATION  08/04/2012   Procedure: POST PARTUM TUBAL LIGATION;  Surgeon: Shanda SHAUNNA Muscat, MD;  Location: [facility];  Service: Gynecology;  Laterality: Bilateral;  Bilateral post partum tubal ligation   WISDOM TOOTH EXTRACTION  08/22/1999    Social History   Socioeconomic History   Marital status: Single    Spouse name: Not on file   Number of children: 3   Years of education: 12   Highest education level: Not on file  Occupational History   Not on file  Tobacco Use   Smoking status: Former   Smokeless tobacco: Never   Tobacco comments:    Jan 2015  Vaping Use   Vaping status: Never Used  Substance and Sexual Activity   Alcohol use: Yes    Alcohol/week: 0.0 standard drinks of alcohol    Comment: occasional   Drug use: No   Sexual activity: Yes    Partners: Male    Birth control/protection: Surgical  Other Topics Concern   Not on  file  Social History Narrative   Not on file   Social Drivers of Health   Financial Resource Strain: Not on file  Food Insecurity: Not on file  Transportation Needs: Not on file  Physical Activity: Not on file  Stress: Not on file  Social Connections: Not on file  Intimate Partner Violence: Not on file    Family History  Problem Relation Age of Onset   Heart disease Father        heart attack in 65.  lm   Cancer Maternal Uncle        bone cancer   Diabetes Maternal Uncle    Cancer Maternal Grandmother        ovarian cancer   Suicidality Mother    Diabetes Maternal Aunt    Cancer Maternal Aunt        lung   Hypertension Paternal Uncle     Current Facility-Administered Medications  Medication Dose Route Frequency Provider Last Rate Last Admin   acetaminophen  (TYLENOL ) tablet 1,000 mg  1,000 mg Oral Once Sponseller, Rebekah R, PA-C       [START ON 02/22/2024] acetaminophen  (TYLENOL ) tablet 1,000 mg  1,000 mg Oral Q6H Sheldon Standing, MD        [START ON 02/22/2024] acetaminophen  (TYLENOL ) tablet 1,000 mg  1,000 mg Oral On Call to OR Sheldon Standing, MD       alum & mag hydroxide-simeth (MAALOX/MYLANTA) 200-200-20 MG/5ML suspension 30 mL  30 mL Oral Q6H PRN Sheldon Standing, MD       ceFAZolin  (ANCEF ) IVPB 2g/100 mL premix  2 g Intravenous Q8H Yadier Bramhall, MD       [START ON 02/22/2024] ceFAZolin  (ANCEF ) IVPB 2g/100 mL premix  2 g Intravenous On Call to OR Sheldon Standing, MD       [START ON 02/22/2024] celecoxib  (CELEBREX ) capsule 200 mg  200 mg Oral On Call to OR Sheldon Standing, MD       Chlorhexidine  Gluconate Cloth 2 % PADS 6 each  6 each Topical Once Sheldon Standing, MD       And   Chlorhexidine  Gluconate Cloth 2 % PADS 6 each  6 each Topical Once Sheldon Standing, MD       diphenhydrAMINE  (BENADRYL ) injection 12.5-25 mg  12.5-25 mg Intravenous Q6H PRN Sheldon Standing, MD       NOREEN ON 02/22/2024] gabapentin  (NEURONTIN ) capsule 300 mg  300 mg Oral On Call to OR Sheldon Standing, MD       HYDROcodone -acetaminophen  (NORCO/VICODIN) 5-325 MG per tablet 1-2 tablet  1-2 tablet Oral Q4H PRN Sheldon Standing, MD       HYDROmorphone  (DILAUDID ) injection 0.5-2 mg  0.5-2 mg Intravenous Q2H PRN Sheldon Standing, MD       lactated ringers  bolus 1,000 mL  1,000 mL Intravenous Once Sponseller, Rebekah R, PA-C 2,000 mL/hr at 02/21/24 2310 1,000 mL at 02/21/24 2310   And   lactated ringers  bolus 1,000 mL  1,000 mL Intravenous Once Sponseller, Rebekah R, PA-C       And   lactated ringers  bolus 500 mL  500 mL Intravenous Once Sponseller, Rebekah R, PA-C       lactated ringers  bolus 1,000 mL  1,000 mL Intravenous Once Sheldon Standing, MD       lactated ringers  bolus 1,000 mL  1,000 mL Intravenous Q8H PRN Sheldon Standing, MD       lactated ringers  infusion   Intravenous Continuous Sponseller, Rebekah R, PA-C       LORazepam  (ATIVAN )  tablet 0.5 mg  0.5 mg Oral Q6H PRN Sheldon Standing, MD       magic mouthwash  15 mL Oral QID PRN Sheldon Standing, MD       menthol -cetylpyridinium  (CEPACOL) lozenge 3 mg  1 lozenge Oral PRN Sheldon Standing, MD       methocarbamol  (ROBAXIN ) injection 1,000 mg  1,000 mg Intravenous Q6H PRN Sheldon Standing, MD       methocarbamol  (ROBAXIN ) tablet 1,000 mg  1,000 mg Oral Q6H PRN Sheldon Standing, MD       metoprolol  tartrate (LOPRESSOR ) injection 5 mg  5 mg Intravenous Q6H PRN Sheldon Standing, MD       metroNIDAZOLE  (FLAGYL ) IVPB 500 mg  500 mg Intravenous Q6H Alexsa Flaum, MD       naphazoline-glycerin  (CLEAR EYES REDNESS) ophth solution 1-2 drop  1-2 drop Both Eyes QID PRN Sheldon Standing, MD       ondansetron  (ZOFRAN ) injection 4 mg  4 mg Intravenous Q6H PRN Sheldon Standing, MD       phenol (CHLORASEPTIC) mouth spray 2 spray  2 spray Mouth/Throat PRN Sheldon Standing, MD       simethicone  (MYLICON) 40 MG/0.6ML suspension 80 mg  80 mg Oral QID PRN Sheldon Standing, MD       sodium chloride  (OCEAN) 0.65 % nasal spray 1-2 spray  1-2 spray Each Nare Q6H PRN Sheldon Standing, MD       vancomycin  (VANCOCIN ) IVPB 1000 mg/200 mL premix  1,000 mg Intravenous Once Sponseller, Rebekah R, PA-C       Current Outpatient Medications  Medication Sig Dispense Refill   Ibuprofen  200 MG CAPS Take 200-800 mg by mouth every 6 (six) hours as needed (for pain).     sulfamethoxazole -trimethoprim  (BACTRIM  DS) 800-160 MG tablet Take 1 tablet by mouth 2 (two) times daily.     TYLENOL  325 MG tablet Take 650 mg by mouth every 6 (six) hours as needed (for pain).     naproxen  (NAPROSYN ) 500 MG tablet Take 1 tablet (500 mg total) by mouth 2 (two) times daily as needed for moderate pain. (Patient not taking: Reported on 02/21/2024) 30 tablet 0   ondansetron  (ZOFRAN ) 4 MG tablet Take 1 tablet (4 mg total) by mouth every 6 (six) hours. (Patient not taking: Reported on 02/21/2024) 12 tablet 0   oxyCODONE -acetaminophen  (PERCOCET/ROXICET) 5-325 MG tablet Take 1 tablet by mouth every 6 (six) hours as needed for severe pain. (Patient not taking: Reported on 02/21/2024) 10 tablet 0   phenazopyridine   (PYRIDIUM ) 200 MG tablet Take 1 tablet (200 mg total) by mouth 3 (three) times daily as needed for pain. (Patient not taking: Reported on 02/21/2024) 10 tablet 0   senna-docusate (SENOKOT-S) 8.6-50 MG tablet Take 1 tablet by mouth at bedtime as needed for mild constipation or moderate constipation. (Patient not taking: Reported on 02/21/2024) 30 tablet 0   traMADol  (ULTRAM ) 50 MG tablet Take 1-2 tablets (50-100 mg total) by mouth every 6 (six) hours as needed for moderate pain. (Patient not taking: Reported on 02/21/2024) 15 tablet 0     Allergies  Allergen Reactions   Amoxicillin Rash and Other (See Comments)    Has patient had a PCN reaction causing immediate rash, facial/tongue/throat swelling, SOB or lightheadedness with hypotension: No Has patient had a PCN reaction causing severe rash involving mucus membranes or skin necrosis: No Has patient had a PCN reaction that required hospitalization No Has patient had a PCN reaction occurring within the last 10 years: No  If all of the above answers are NO, then may proceed with Cephalosporin use.   Egg-Derived Products Rash     BP (!) 138/98   Pulse (!) 116   Temp 99.6 F (37.6 C)   Resp 20   Ht 5' 10 (1.778 m)   Wt 79.4 kg   SpO2 97%   BMI 25.11 kg/m     Results:   Labs: Results for orders placed or performed during the hospital encounter of 02/21/24 (from the past 48 hours)  I-Stat Lactic Acid, ED     Status: None   Collection Time: 02/21/24 11:07 PM  Result Value Ref Range   Lactic Acid, Venous 0.8 0.5 - 1.9 mmol/L  CBC with Differential     Status: Abnormal   Collection Time: 02/21/24 11:08 PM  Result Value Ref Range   WBC 19.5 (H) 4.0 - 10.5 K/uL   RBC 3.17 (L) 3.87 - 5.11 MIL/uL   Hemoglobin 10.0 (L) 12.0 - 15.0 g/dL   HCT 70.4 (L) 63.9 - 53.9 %   MCV 93.1 80.0 - 100.0 fL   MCH 31.5 26.0 - 34.0 pg   MCHC 33.9 30.0 - 36.0 g/dL   RDW 88.0 88.4 - 84.4 %   Platelets 431 (H) 150 - 400 K/uL   nRBC 0.0 0.0 - 0.2 %    Neutrophils Relative % 87 %   Neutro Abs 16.8 (H) 1.7 - 7.7 K/uL   Lymphocytes Relative 5 %   Lymphs Abs 1.0 0.7 - 4.0 K/uL   Monocytes Relative 6 %   Monocytes Absolute 1.2 (H) 0.1 - 1.0 K/uL   Eosinophils Relative 1 %   Eosinophils Absolute 0.3 0.0 - 0.5 K/uL   Basophils Relative 1 %   Basophils Absolute 0.1 0.0 - 0.1 K/uL   Immature Granulocytes 0 %   Abs Immature Granulocytes 0.07 0.00 - 0.07 K/uL    Comment: Performed at [facility], [address] [place], [place] [address]    Imaging / Studies: DG Chest Port 1 View Result Date: 02/21/2024 CLINICAL DATA:  Recent BBL, abscess on buttocks,fever and ? Of sepsis now, patient is prone due to pain Questionable sepsis - evaluate for abnormality EXAM: PORTABLE CHEST 1 VIEW COMPARISON:  Chest x-ray 11/09/2008 FINDINGS: Limited evaluation due to overlapping osseous structures and overlying soft tissues. The heart and mediastinal contours are within normal limits. No focal consolidation. No pulmonary edema. No pleural effusion. No pneumothorax. No acute osseous abnormality. IMPRESSION: No active disease. Limited evaluation due to overlapping osseous structures and overlying soft tissues. Consider repeat chest x-ray PA and lateral view for further evaluation. Electronically Signed   By: Morgane  Naveau M.D.   On: 02/21/2024 23:16   CT ABDOMEN PELVIS W CONTRAST Result Date: 02/21/2024 CLINICAL DATA:  Fever with dizziness, headache, fever, chills and nausea. EXAM: CT ABDOMEN AND PELVIS WITH CONTRAST TECHNIQUE: Multidetector CT imaging of the abdomen and pelvis was performed using the standard protocol following bolus administration of intravenous contrast. RADIATION DOSE REDUCTION: This exam was performed according to the departmental dose-optimization program which includes automated exposure control, adjustment of the mA and/or kV according to patient size and/or use of iterative reconstruction technique. CONTRAST:  OMNIPAQUE   IOHEXOL  300 MG/ML  SOLN COMPARISON:  October 25, 2021 FINDINGS: Lower chest: No acute abnormality. Hepatobiliary: No focal liver abnormality is seen. Status post cholecystectomy. No biliary dilatation. Pancreas: Unremarkable. No pancreatic ductal dilatation or surrounding inflammatory changes. Spleen: Normal in size without focal abnormality. Adrenals/Urinary  Tract: Adrenal glands are unremarkable. Kidneys are normal, without obstructing renal calculi or focal lesions. Prominent bilateral extrarenal pelvis are seen with mild to moderate severity right-sided hydroureter also noted. A 2 mm nonobstructing renal calculus is seen within the lower pole of the right kidney. 1 mm nonobstructing renal calculi are seen within the mid left kidney. Bladder is unremarkable. Stomach/Bowel: Stomach is within normal limits. Appendix appears normal. No evidence of bowel wall thickening, distention, or inflammatory changes. Vascular/Lymphatic: No significant vascular findings are present. No enlarged abdominal or pelvic lymph nodes. Reproductive: Uterus and bilateral adnexa are unremarkable. Other: No abdominal wall hernia or abnormality. No abdominopelvic ascites. Musculoskeletal: A 5.2 cm x 6.0 cm x 7.3 cm collection of fluid and air is seen within the subcutaneous fat along the posterolateral pelvic wall on the right. Moderate to marked severity surrounding inflammatory fat stranding is seen. Patchy, moderate severity areas of subcutaneous inflammatory fat stranding and subcentimeter foci of soft tissue air are seen inferior to this region. Diffuse mild to moderate severity inflammatory fat stranding is present throughout the remaining portions of the posterior, posterolateral and lateral pelvic walls, bilaterally. No acute osseous abnormalities are identified. IMPRESSION: 1. 5.2 cm x 6.0 cm x 7.3 cm abscess within the subcutaneous fat along the posterolateral pelvic wall on the right with additional areas of adjacent cellulitis and  subcutaneous emphysema. 2. Bilateral nonobstructing renal calculi. 3. Evidence of prior cholecystectomy. Electronically Signed   By: Suzen Dials M.D.   On: 02/21/2024 00:15    Medications / Allergies: per chart  Antibiotics: Anti-infectives (From admission, onward)    Start     Dose/Rate Route Frequency Ordered Stop   02/22/24 0600  ceFAZolin  (ANCEF ) IVPB 2g/100 mL premix        2 g 200 mL/hr over 30 Minutes Intravenous On call to O.R. 02/21/24 2326 02/23/24 0559   02/21/24 2330  ceFAZolin  (ANCEF ) IVPB 2g/100 mL premix        2 g 200 mL/hr over 30 Minutes Intravenous Every 8 hours 02/21/24 2323     02/21/24 2330  metroNIDAZOLE  (FLAGYL ) IVPB 500 mg        500 mg 100 mL/hr over 60 Minutes Intravenous Every 6 hours 02/21/24 2323     02/21/24 2245  ceFEPIme  (MAXIPIME ) 2 g in sodium chloride  0.9 % 100 mL IVPB  Status:  Discontinued        2 g 200 mL/hr over 30 Minutes Intravenous  Once 02/21/24 2242 02/21/24 2323   02/21/24 2245  vancomycin  (VANCOCIN ) IVPB 1000 mg/200 mL premix        1,000 mg 200 mL/hr over 60 Minutes Intravenous  Once 02/21/24 2242           Note: Portions of this report may have been transcribed using voice recognition software. Every effort was made to ensure accuracy; however, inadvertent computerized transcription errors may be present.   Any transcriptional errors that result from this process are unintentional.    [name], MD, FACS, MASCRS Esophageal, Gastrointestinal & Colorectal Surgery Robotic and Minimally Invasive Surgery  [facility] [address], [address] [place], [place] [address] [phone] Fax [phone] Main  CONTACT INFORMATION: Weekday (9AM-5PM): Call [facility] main office at [phone] Weeknight (5PM-9AM) or Weekend/Holiday: Check EPIC Web Links tab & use AMION (password  [email or web address]) for General Surgery [facility] coverage  Please, DO NOT use SecureChat  (it is not  reliable communication to reach operating surgeons & will  lead to a delay in care).   Epic staff messaging available for outptient concerns needing 1-2 business day response.       02/21/2024  11:27 PM

## 2024-02-21 NOTE — Discharge Instructions (Signed)
 The pocket of abscess was aspirated with a needle.  Take antibiotics as directed.  You may need to have this incised with a scalpel by your surgeon.  Please contact him tomorrow morning for an appointment.

## 2024-02-22 ENCOUNTER — Observation Stay (HOSPITAL_COMMUNITY): Payer: MEDICAID | Admitting: Anesthesiology

## 2024-02-22 ENCOUNTER — Encounter (HOSPITAL_COMMUNITY): Payer: Self-pay | Admitting: Internal Medicine

## 2024-02-22 ENCOUNTER — Encounter (HOSPITAL_COMMUNITY): Admission: EM | Disposition: A | Discharge status: Home / Self Care | Payer: Self-pay | Source: Home / Self Care

## 2024-02-22 ENCOUNTER — Other Ambulatory Visit: Payer: Self-pay

## 2024-02-22 DIAGNOSIS — L0231 Cutaneous abscess of buttock: Secondary | ICD-10-CM

## 2024-02-22 DIAGNOSIS — T819XXA Unspecified complication of procedure, initial encounter: Secondary | ICD-10-CM | POA: Diagnosis present

## 2024-02-22 HISTORY — PX: INCISION AND DRAINAGE ABSCESS: SHX5864

## 2024-02-22 LAB — APTT: aPTT: 36 s (ref 24–36)

## 2024-02-22 LAB — COMPREHENSIVE METABOLIC PANEL WITH GFR
ALT: 36 U/L (ref 0–44)
AST: 45 U/L — ABNORMAL HIGH (ref 15–41)
Albumin: 2.7 g/dL — ABNORMAL LOW (ref 3.5–5.0)
Alkaline Phosphatase: 90 U/L (ref 38–126)
Anion gap: 10 (ref 5–15)
BUN: 8 mg/dL (ref 6–20)
CO2: 24 mmol/L (ref 22–32)
Calcium: 8.4 mg/dL — ABNORMAL LOW (ref 8.9–10.3)
Chloride: 103 mmol/L (ref 98–111)
Creatinine, Ser: 0.63 mg/dL (ref 0.44–1.00)
GFR, Estimated: >60 mL/min (ref >60)
Glucose, Bld: 107 mg/dL — ABNORMAL HIGH (ref 70–99)
Potassium: 3.6 mmol/L (ref 3.5–5.1)
Sodium: 137 mmol/L (ref 135–145)
Total Bilirubin: 0.8 mg/dL (ref 0.0–1.2)
Total Protein: 5.6 g/dL — ABNORMAL LOW (ref 6.5–8.1)

## 2024-02-22 LAB — PROTIME-INR
INR: 1.1 (ref 0.8–1.2)
INR: 1.1 (ref 0.8–1.2)
Prothrombin Time: 14.8 s (ref 11.4–15.2)
Prothrombin Time: 15.3 s — ABNORMAL HIGH (ref 11.4–15.2)

## 2024-02-22 LAB — IRON AND TIBC
Iron: 12 ug/dL — ABNORMAL LOW (ref 28–170)
Saturation Ratios: 5 % — ABNORMAL LOW (ref 10.4–31.8)
TIBC: 262 ug/dL (ref 250–450)
UIBC: 250 ug/dL

## 2024-02-22 LAB — CBC
HCT: 26.4 % — ABNORMAL LOW (ref 36.0–46.0)
Hemoglobin: 8.7 g/dL — ABNORMAL LOW (ref 12.0–15.0)
MCH: 31.2 pg (ref 26.0–34.0)
MCHC: 33 g/dL (ref 30.0–36.0)
MCV: 94.6 fL (ref 80.0–100.0)
Platelets: 375 K/uL (ref 150–400)
RBC: 2.79 MIL/uL — ABNORMAL LOW (ref 3.87–5.11)
RDW: 11.9 % (ref 11.5–15.5)
WBC: 17.7 K/uL — ABNORMAL HIGH (ref 4.0–10.5)
nRBC: 0 % (ref 0.0–0.2)

## 2024-02-22 LAB — HIV ANTIBODY (ROUTINE TESTING W REFLEX): HIV Screen 4th Generation wRfx: NONREACTIVE

## 2024-02-22 LAB — RESP PANEL BY RT-PCR (RSV, FLU A&B, COVID)  RVPGX2
Influenza A by PCR: NEGATIVE
Influenza B by PCR: NEGATIVE
Resp Syncytial Virus by PCR: NEGATIVE
SARS Coronavirus 2 by RT PCR: NEGATIVE

## 2024-02-22 LAB — MRSA NEXT GEN BY PCR, NASAL: MRSA by PCR Next Gen: NOT DETECTED

## 2024-02-22 LAB — VITAMIN B12: Vitamin B-12: 248 pg/mL (ref 180–914)

## 2024-02-22 LAB — FOLATE: Folate: 18.6 ng/mL (ref >5.9)

## 2024-02-22 SURGERY — INCISION AND DRAINAGE, ABSCESS
Anesthesia: General

## 2024-02-22 MED ORDER — SODIUM CHLORIDE 0.9% FLUSH
3.0000 mL | Freq: Two times a day (BID) | INTRAVENOUS | Status: DC
  Administered 2024-02-22 – 2024-02-23 (×2): 3 mL via INTRAVENOUS

## 2024-02-22 MED ORDER — GABAPENTIN 300 MG PO CAPS
ORAL_CAPSULE | ORAL | Status: AC
  Filled 2024-02-22: qty 1

## 2024-02-22 MED ORDER — ONDANSETRON HCL 4 MG/2ML IJ SOLN
INTRAMUSCULAR | Status: AC
  Filled 2024-02-22: qty 2

## 2024-02-22 MED ORDER — BISACODYL 10 MG RE SUPP: 10.0000 mg | Freq: Two times a day (BID) | RECTAL | Status: DC | PRN

## 2024-02-22 MED ORDER — DEXAMETHASONE SODIUM PHOSPHATE 10 MG/ML IJ SOLN
INTRAMUSCULAR | Status: AC
  Filled 2024-02-22: qty 1

## 2024-02-22 MED ORDER — LACTATED RINGERS IV BOLUS: 1000.0000 mL | Freq: Three times a day (TID) | INTRAVENOUS | Status: AC | PRN

## 2024-02-22 MED ORDER — SODIUM CHLORIDE 0.9% FLUSH
10.0000 mL | Freq: Two times a day (BID) | INTRAVENOUS | Status: DC
  Administered 2024-02-22 – 2024-02-24 (×4): 10 mL

## 2024-02-22 MED ORDER — DIBUCAINE (PERIANAL) 1 % EX OINT
TOPICAL_OINTMENT | CUTANEOUS | Status: AC
  Filled 2024-02-22: qty 28

## 2024-02-22 MED ORDER — LIDOCAINE HCL (CARDIAC) PF 100 MG/5ML IV SOSY
PREFILLED_SYRINGE | INTRAVENOUS | Status: DC | PRN
  Administered 2024-02-22: 70 mg via INTRAVENOUS

## 2024-02-22 MED ORDER — DEXAMETHASONE SODIUM PHOSPHATE 10 MG/ML IJ SOLN
INTRAMUSCULAR | Status: DC | PRN
Start: 2024-02-22 — End: 2024-02-22
  Administered 2024-02-22: 8 mg via INTRAVENOUS

## 2024-02-22 MED ORDER — ROCURONIUM BROMIDE 100 MG/10ML IV SOLN
INTRAVENOUS | Status: DC | PRN
  Administered 2024-02-22: 20 mg via INTRAVENOUS

## 2024-02-22 MED ORDER — CELECOXIB 200 MG PO CAPS
ORAL_CAPSULE | ORAL | Status: AC
  Filled 2024-02-22: qty 1

## 2024-02-22 MED ORDER — OXYCODONE HCL 5 MG/5ML PO SOLN: 5.0000 mg | Freq: Once | ORAL | Status: DC | PRN

## 2024-02-22 MED ORDER — ACETAMINOPHEN 325 MG PO TABS: 650.0000 mg | ORAL_TABLET | Freq: Four times a day (QID) | ORAL | Status: DC | PRN

## 2024-02-22 MED ORDER — HYDROMORPHONE HCL 1 MG/ML IJ SOLN: 0.2500 mg | INTRAMUSCULAR | Status: DC | PRN

## 2024-02-22 MED ORDER — CEFAZOLIN SODIUM-DEXTROSE 2-4 GM/100ML-% IV SOLN
INTRAVENOUS | Status: AC
  Filled 2024-02-22: qty 100

## 2024-02-22 MED ORDER — HYDROMORPHONE HCL 1 MG/ML IJ SOLN
INTRAMUSCULAR | Status: DC | PRN
  Administered 2024-02-22: 1 mg via INTRAVENOUS

## 2024-02-22 MED ORDER — MIDAZOLAM HCL 2 MG/2ML IJ SOLN
INTRAMUSCULAR | Status: AC
  Filled 2024-02-22: qty 2

## 2024-02-22 MED ORDER — DEXTROSE 50 % IV SOLN: 50.0000 mL | INTRAVENOUS | Status: DC | PRN

## 2024-02-22 MED ORDER — HEPARIN SODIUM (PORCINE) 5000 UNIT/ML IJ SOLN
5000.0000 [IU] | Freq: Three times a day (TID) | INTRAMUSCULAR | Status: DC
  Administered 2024-02-23: 5000 [IU] via SUBCUTANEOUS
  Filled 2024-02-22 (×3): qty 1

## 2024-02-22 MED ORDER — BUPIVACAINE LIPOSOME 1.3 % IJ SUSP
INTRAMUSCULAR | Status: AC
  Filled 2024-02-22: qty 20

## 2024-02-22 MED ORDER — SUGAMMADEX SODIUM 200 MG/2ML IV SOLN
INTRAVENOUS | Status: DC | PRN
Start: 2024-02-22 — End: 2024-02-22
  Administered 2024-02-22: 200 mg via INTRAVENOUS

## 2024-02-22 MED ORDER — SODIUM CHLORIDE 0.9% FLUSH: 3.0000 mL | INTRAVENOUS | Status: DC | PRN

## 2024-02-22 MED ORDER — IBUPROFEN 200 MG PO TABS: 400.0000 mg | ORAL_TABLET | Freq: Four times a day (QID) | ORAL | Status: DC | PRN

## 2024-02-22 MED ORDER — SODIUM CHLORIDE 0.9 % IV SOLN: 250.0000 mL | INTRAVENOUS | Status: DC | PRN

## 2024-02-22 MED ORDER — DROPERIDOL 2.5 MG/ML IJ SOLN: 0.6250 mg | Freq: Once | INTRAMUSCULAR | Status: DC | PRN

## 2024-02-22 MED ORDER — METHYLENE BLUE (ANTIDOTE) 1 % IV SOLN
INTRAVENOUS | Status: AC
  Filled 2024-02-22: qty 10

## 2024-02-22 MED ORDER — HYDROMORPHONE HCL 2 MG/ML IJ SOLN
INTRAMUSCULAR | Status: AC
Start: 2024-02-22 — End: 2024-02-22
  Filled 2024-02-22: qty 1

## 2024-02-22 MED ORDER — MIDAZOLAM HCL 5 MG/5ML IJ SOLN
INTRAMUSCULAR | Status: DC | PRN
  Administered 2024-02-22: 2 mg via INTRAVENOUS

## 2024-02-22 MED ORDER — SODIUM CHLORIDE 0.9% FLUSH
3.0000 mL | Freq: Two times a day (BID) | INTRAVENOUS | Status: DC
  Administered 2024-02-22: 3 mL via INTRAVENOUS

## 2024-02-22 MED ORDER — SODIUM CHLORIDE 0.9 % IV SOLN: 250.0000 mL | INTRAVENOUS | Status: AC | PRN

## 2024-02-22 MED ORDER — LIDOCAINE HCL (PF) 2 % IJ SOLN
INTRAMUSCULAR | Status: AC
Start: 2024-02-22 — End: 2024-02-22
  Filled 2024-02-22: qty 5

## 2024-02-22 MED ORDER — HYDROGEN PEROXIDE 3 % EX SOLN
CUTANEOUS | Status: AC
  Filled 2024-02-22: qty 473

## 2024-02-22 MED ORDER — ACETAMINOPHEN 650 MG RE SUPP: 650.0000 mg | Freq: Four times a day (QID) | RECTAL | Status: DC | PRN

## 2024-02-22 MED ORDER — SUCCINYLCHOLINE CHLORIDE 200 MG/10ML IV SOSY
PREFILLED_SYRINGE | INTRAVENOUS | Status: DC | PRN
  Administered 2024-02-22: 120 mg via INTRAVENOUS

## 2024-02-22 MED ORDER — BUPIVACAINE-EPINEPHRINE 0.25% -1:200000 IJ SOLN
INTRAMUSCULAR | Status: DC | PRN
  Administered 2024-02-22: 30 mL

## 2024-02-22 MED ORDER — PHENYLEPHRINE 80 MCG/ML (10ML) SYRINGE FOR IV PUSH (FOR BLOOD PRESSURE SUPPORT)
PREFILLED_SYRINGE | INTRAVENOUS | Status: DC | PRN
  Administered 2024-02-22: 80 ug via INTRAVENOUS

## 2024-02-22 MED ORDER — PROPOFOL 10 MG/ML IV BOLUS
INTRAVENOUS | Status: DC | PRN
  Administered 2024-02-22: 200 mg via INTRAVENOUS

## 2024-02-22 MED ORDER — BUPIVACAINE-EPINEPHRINE (PF) 0.25% -1:200000 IJ SOLN
INTRAMUSCULAR | Status: AC
Start: 2024-02-22 — End: 2024-02-22
  Filled 2024-02-22: qty 30

## 2024-02-22 MED ORDER — OXYCODONE HCL 5 MG PO TABS: 5.0000 mg | ORAL_TABLET | Freq: Once | ORAL | Status: DC | PRN

## 2024-02-22 MED ORDER — POLYETHYLENE GLYCOL 3350 17 G PO PACK
17.0000 g | PACK | Freq: Two times a day (BID) | ORAL | Status: DC | PRN
  Administered 2024-02-23: 17 g via ORAL
  Filled 2024-02-22: qty 1

## 2024-02-22 MED ORDER — ONDANSETRON HCL 4 MG/2ML IJ SOLN: 4.0000 mg | Freq: Once | INTRAMUSCULAR | Status: DC | PRN

## 2024-02-22 SURGICAL SUPPLY — 33 items
BAG COUNTER SPONGE SURGICOUNT (BAG) IMPLANT
BLADE SURG 15 STRL LF DISP TIS (BLADE) ×1 IMPLANT
BLADE SURG SZ11 CARB STEEL (BLADE) ×1 IMPLANT
BRIEF MESH DISP LRG (UNDERPADS AND DIAPERS) ×1 IMPLANT
CHLORAPREP W/TINT 26 (MISCELLANEOUS) IMPLANT
COVER SURGICAL LIGHT HANDLE (MISCELLANEOUS) ×1 IMPLANT
DRAPE LAPAROTOMY T 102X78X121 (DRAPES) ×1 IMPLANT
ELECT REM PT RETURN 15FT ADLT (MISCELLANEOUS) ×1 IMPLANT
GAUZE 4X4 16PLY ~~LOC~~+RFID DBL (SPONGE) ×1 IMPLANT
GAUZE PAD ABD 8X10 STRL (GAUZE/BANDAGES/DRESSINGS) ×1 IMPLANT
GAUZE SPONGE 4X4 12PLY STRL (GAUZE/BANDAGES/DRESSINGS) ×1 IMPLANT
GAUZE STRETCH 2X75IN STRL (MISCELLANEOUS) IMPLANT
GLOVE ECLIPSE 8.0 STRL XLNG CF (GLOVE) ×1 IMPLANT
GLOVE INDICATOR 8.0 STRL GRN (GLOVE) ×1 IMPLANT
GOWN STRL REUS W/ TWL XL LVL3 (GOWN DISPOSABLE) ×3 IMPLANT
HIBICLENS CHG 4% 4OZ BTL (MISCELLANEOUS) IMPLANT
KIT BASIN OR (CUSTOM PROCEDURE TRAY) ×1 IMPLANT
KIT TURNOVER KIT A (KITS) ×1 IMPLANT
NDL HYPO 22X1.5 SAFETY MO (MISCELLANEOUS) ×1 IMPLANT
NEEDLE HYPO 22X1.5 SAFETY MO (MISCELLANEOUS) ×1 IMPLANT
PACK BASIC VI WITH GOWN DISP (CUSTOM PROCEDURE TRAY) ×1 IMPLANT
PENCIL SMOKE EVACUATOR (MISCELLANEOUS) IMPLANT
SUCTION TUBE FRAZIER 12FR DISP (SUCTIONS) IMPLANT
SURGILUBE 2OZ TUBE FLIPTOP (MISCELLANEOUS) ×1 IMPLANT
SUT CHROMIC 2 0 SH (SUTURE) IMPLANT
SUT VIC AB 2-0 UR6 27 (SUTURE) IMPLANT
SWAB COLLECTION DEVICE MRSA (MISCELLANEOUS) IMPLANT
SWAB CULTURE ESWAB REG 1ML (MISCELLANEOUS) IMPLANT
SYR 20ML LL LF (SYRINGE) ×1 IMPLANT
SYR 3ML LL SCALE MARK (SYRINGE) IMPLANT
SYR BULB IRRIG 60ML STRL (SYRINGE) IMPLANT
TAPE CLOTH SURG 4X10 WHT LF (GAUZE/BANDAGES/DRESSINGS) IMPLANT
TOWEL OR 17X26 10 PK STRL BLUE (TOWEL DISPOSABLE) ×1 IMPLANT

## 2024-02-22 NOTE — Progress Notes (Signed)
 PROGRESS NOTE    Patient: [name] Bass                            PCP: Patient, No Pcp Per                    DOB: [birth year]            DOA: 02/21/2024 FMW:[id number]             DOS: 02/22/2024, 11:48 AM   LOS: 0 days   Date of Service: The patient was seen and examined on 02/22/2024  Subjective:   The patient was seen and examined this morning. Blood pressure running soft otherwise hemodynamically stable. Improved leukocytosis  S/P right buttocks I&D by general surgery Dr. Sheldon  Brief Narrative:   [name] Bass is a 38 y.o. female with medical history significant of Brazilian butt lift procedure postop day 16 presented to emergency for the second time to bed complaining of worsening fever, persistent pain. Patient had recent Sudan butt lift procedure done by Dr. Aisha at outpatient surgical center in [facility] 02/05/2024.  5 days ago patient had a pain started on the right buttock with associated fever.   She was seen by her surgeon reported aspirated the area of the right buttock and started on Bactrim  however presented to ED for the second time today for persistent pain.  In the ED 02/21/2024 patient found to have suspected cellulitis and questionable abscess of the right buttock confirmed on the CT imaging subsequently underwent aspiration around 10 cc purulent discharge and patient was sent home to follow-up with surgeon in the office 7/5. 7/3 ED today- revealed concern for abscess formation. ED physician initially spoke with patient's or general surgeon Dr. Aisha who stated that he does not have any hospital privilege to do any surgical procedure and recommended to speak with our inpatient surgeon for further evaluation.    Dr. Garnette Sheldon has been consulted..  Per general surgery no suspicion for Fournier's gangrene/necrotic fasciitis at this time.  Recommended IV antibiotic coverage and plan to take her OR in the morning 7/4- plan for I&D.     At presentation to ED  patient is tachycardic otherwise hemodynamically stable. CBC showing leukocytosis 19.5, stable H&H normal platelet count. CMP showing low bicarb 21, low albumin 3.1  Lactic acid within normal range. Pregnancy test negative. EKG shows sinus tachycardia heart rate 128. Blood cultures are in process.  CT abdomen pelvis from 7/2 showed 5.2 cm x 6.0 cm x 7.3 cm abscess within the subcutaneous fat along the posterolateral pelvic wall on the right with additional areas of adjacent cellulitis and subcutaneous emphysema. 2. Bilateral nonobstructing renal calculi. 3. Evidence of prior cholecystectomy.   Was given IV vancomycin , cefepime -General Surgery ordered IV cefazolin  and metronidazole  for surgical prophylaxis and 1 L of LR bolus followed by continue LR 150 cc/h.   Hospitalist has been consulted for further management of postop complication associated with posterolateral pelvic wall abscess  -----------------------------------------------------------------------------    Assessment & Plan:   Principal Problem:   Abscess of left buttock       Assessment and Plan:  Postop complication-posterolateral right pelvic wall abscess -  -Patient recently 02/05/2024 has Sudan butt lift procedure which has been uncomplicated with abscess formation.   -Primary plastic surgeon Dr. Aisha  - Consulted general surgeon Dr. Garnette Sheldon 02/22/2024-s/p right Botox abscess I&D -   - CT abdomen pelvis showed 5.2  cm x 6.0 cm x 7.3 cm abscess within the subcutaneous fat along the posterolateral pelvic wall on the right with additional areas of adjacent cellulitis and subcutaneous emphysema.  -Continue current antibiotics of IV cefazolin , Flagyl  - Continue IVF  -Need to follow-up with blood cultures and pelvicwall abscess result for appropriate antibiotic guidance.    Acute on chronic anemia- -postop mild drop in hemoglobin, monitoring closely -Will transfuse if hemoglobin drops below 7.0 or  patient becomes symptomatic or hypotensive -Checking baseline iron levels    ----------------------------------------------------------------------------------------------------------------------------------------------- Nutritional status:  The patient's BMI is: Body mass index is 26.04 kg/m. I agree with the assessment and plan as outlined    ---------------------------------------------------------------------------------------------------------------------------------------------------- Cultures; Intraoperative wound cultures >> Blood cultures >>    ------------------------------------------------------------------------------------------------------------------------------------------------  DVT prophylaxis:  heparin  injection 5,000 Units Start: 02/23/24 0600 SCDs Start: 02/22/24 0018 Place TED hose Start: 02/22/24 0018 SCD's Start: 02/21/24 2325   Code Status:   Code Status: Full Code  Family Communication: No family member present at bedside- -Advance care planning has been discussed.   Admission status:   Status is: Observation The patient remains OBS appropriate and will d/c before 2 midnights.   Disposition: From  - home             Planning for discharge in 1-2 days: to   Procedures:   No admission procedures for hospital encounter.   Antimicrobials:  Anti-infectives (From admission, onward)    Start     Dose/Rate Route Frequency Ordered Stop   02/22/24 0600  ceFAZolin  (ANCEF ) IVPB 2g/100 mL premix        2 g 200 mL/hr over 30 Minutes Intravenous Every 8 hours 02/21/24 2323     02/22/24 0600  ceFAZolin  (ANCEF ) IVPB 2g/100 mL premix  Status:  Discontinued        2 g 200 mL/hr over 30 Minutes Intravenous On call to O.R. 02/21/24 2326 02/22/24 0932   02/21/24 2359  metroNIDAZOLE  (FLAGYL ) IVPB 500 mg        500 mg 100 mL/hr over 60 Minutes Intravenous Every 12 hours 02/21/24 2323     02/21/24 2245  ceFEPIme  (MAXIPIME ) 2 g in sodium chloride  0.9 % 100  mL IVPB  Status:  Discontinued        2 g 200 mL/hr over 30 Minutes Intravenous  Once 02/21/24 2242 02/21/24 2340   02/21/24 2245  vancomycin  (VANCOCIN ) IVPB 1000 mg/200 mL premix        1,000 mg 200 mL/hr over 60 Minutes Intravenous  Once 02/21/24 2242 02/22/24 0238        Medication:   acetaminophen   1,000 mg Oral Q6H   Chlorhexidine  Gluconate Cloth  6 each Topical Once   [START ON 02/23/2024] heparin   5,000 Units Subcutaneous Q8H   sodium chloride  flush  10-40 mL Intracatheter Q12H   sodium chloride  flush  3 mL Intravenous Q12H   sodium chloride  flush  3 mL Intravenous Q12H   sodium chloride  flush  3 mL Intravenous Q12H    sodium chloride , sodium chloride , acetaminophen  **OR** acetaminophen , alum & mag hydroxide-simeth, bisacodyl , dextrose , diphenhydrAMINE , HYDROcodone -acetaminophen , HYDROmorphone  (DILAUDID ) injection, ibuprofen , lactated ringers , lactated ringers , LORazepam , magic mouthwash, menthol -cetylpyridinium, methocarbamol  (ROBAXIN ) injection, methocarbamol , metoprolol  tartrate, naphazoline-glycerin , ondansetron  (ZOFRAN ) IV, phenol, polyethylene glycol, simethicone , sodium chloride , sodium chloride  flush, sodium chloride  flush   Objective:   Vitals:   02/22/24 0900 02/22/24 0915 02/22/24 0924 02/22/24 0957  BP: 116/65 122/62 (!) 97/55 100/62  Pulse: 85 78 72 79  Resp: 11 11 16  18  Temp:   97.9 F (36.6 C) 98.2 F (36.8 C)  TempSrc:    Oral  SpO2: 95% 93% 94% 97%  Weight:      Height:        Intake/Output Summary (Last 24 hours) at 02/22/2024 1148 Last data filed at 02/22/2024 0855 Gross per 24 hour  Intake 3476.65 ml  Output 5 ml  Net 3471.65 ml   Filed Weights   02/21/24 2112 02/22/24 0100  Weight: 79.4 kg 82.3 kg     Physical examination:   Constitution: Somnolent but arousable, following, Psychiatric:   Normal and stable mood and affect, cognition intact,   HEENT:        Normocephalic, PERRL, otherwise with in Normal limits  Chest:         Chest  symmetric Cardio vascular:  S1/S2, RRR, No murmure, No Rubs or Gallops  pulmonary: Clear to auscultation bilaterally, respirations unlabored, negative wheezes / crackles Abdomen: Soft, non-tender, non-distended, bowel sounds,no masses, no organomegaly Muscular skeletal: Limited exam - in bed, able to move all 4 extremities,   Neuro: CNII-XII intact. , normal motor and sensation, reflexes intact  Extremities: No pitting edema lower extremities, +2 pulses  Skin: Dry, warm to touch, negative for any Rashes, left buttock surgical wound Wounds: Left buttocks wound, surgical wound dressing in place   ------------------------------------------------------------------------------------------------------------------------------------------    LABs:     Latest Ref Rng & Units 02/22/2024    4:05 AM 02/21/2024   11:08 PM 02/20/2024    9:09 PM  CBC  WBC 4.0 - 10.5 K/uL 17.7  19.5  16.6   Hemoglobin 12.0 - 15.0 g/dL 8.7  89.9  89.1   Hematocrit 36.0 - 46.0 % 26.4  29.5  32.0   Platelets 150 - 400 K/uL 375  431  500       Latest Ref Rng & Units 02/22/2024    4:05 AM 02/21/2024   11:08 PM 02/20/2024    9:09 PM  CMP  Glucose 70 - 99 mg/dL 892  880  873   BUN 6 - 20 mg/dL 8  10  10    Creatinine 0.44 - 1.00 mg/dL 9.36  9.45  9.38   Sodium 135 - 145 mmol/L 137  135  133   Potassium 3.5 - 5.1 mmol/L 3.6  3.6  3.5   Chloride 98 - 111 mmol/L 103  105  98   CO2 22 - 32 mmol/L 24  21  23    Calcium  8.9 - 10.3 mg/dL 8.4  8.6  8.8   Total Protein 6.5 - 8.1 g/dL 5.6  6.3  6.8   Total Bilirubin 0.0 - 1.2 mg/dL 0.8  0.7  0.6   Alkaline Phos 38 - 126 U/L 90  75  62   AST 15 - 41 U/L 45  16  17   ALT 0 - 44 U/L 36  20  16        Micro Results Recent Results (from the past 240 hours)  Culture, blood (routine x 2)     Status: None (Preliminary result)   Collection Time: 02/20/24  9:09 PM   Specimen: BLOOD RIGHT FOREARM  Result Value Ref Range Status   Specimen Description   Final    BLOOD RIGHT  FOREARM Performed at [facility], [address] [place], [place] [address]    Special Requests   Final    BOTTLES DRAWN AEROBIC AND ANAEROBIC Blood Culture results may not be optimal due to an  inadequate volume of blood received in culture bottles Performed at [facility], [address], [place], [place] [address]    Culture   Final    NO GROWTH 2 DAYS Performed at [facility], [address] [place], [place] [address]    Report Status PENDING  Incomplete  Blood Culture (routine x 2)     Status: None (Preliminary result)   Collection Time: 02/21/24 10:50 PM   Specimen: BLOOD RIGHT FOREARM  Result Value Ref Range Status   Specimen Description   Final    BLOOD RIGHT FOREARM Performed at [facility], [address] [place], [place] [address]    Special Requests   Final    BOTTLES DRAWN AEROBIC ONLY Blood Culture adequate volume Performed at [facility], [address] [place], [place] [address]    Culture   Final    NO GROWTH < 12 HOURS Performed at [facility], [address], [place], [place] [address]    Report Status PENDING  Incomplete  Blood Culture (routine x 2)     Status: None (Preliminary result)   Collection Time: 02/22/24 12:20 AM   Specimen: BLOOD RIGHT ARM  Result Value Ref Range Status   Specimen Description   Final    BLOOD RIGHT ARM Performed at [facility], [address], [place], [place] [address]    Special Requests   Final    BOTTLES DRAWN AEROBIC AND ANAEROBIC Blood Culture results may not be optimal due to an inadequate volume of blood received in culture bottles Performed at [facility], [address], [place], [place] [address]    Culture   Final    NO GROWTH < 12 HOURS Performed at [facility], [address], [place], [place] [address]    Report Status PENDING  Incomplete  Resp panel by RT-PCR (RSV, Flu A&B, Covid) Anterior Nasal Swab      Status: None   Collection Time: 02/22/24 12:30 AM   Specimen: Anterior Nasal Swab  Result Value Ref Range Status   SARS Coronavirus 2 by RT PCR NEGATIVE NEGATIVE Final    Comment: (NOTE) SARS-CoV-2 target nucleic acids are NOT DETECTED.  The SARS-CoV-2 RNA is generally detectable in upper respiratory specimens during the acute phase of infection. The lowest concentration of SARS-CoV-2 viral copies this assay can detect is 138 copies/mL. A negative result does not preclude SARS-Cov-2 infection and should not be used as the sole basis for treatment or other patient management decisions. A negative result may occur with  improper specimen collection/handling, submission of specimen other than nasopharyngeal swab, presence of viral mutation(s) within the areas targeted by this assay, and inadequate number of viral copies(<138 copies/mL). A negative result must be combined with clinical observations, patient history, and epidemiological information. The expected result is Negative.  Fact Sheet for Patients:  BloggerCourse.com  Fact Sheet for Healthcare Providers:  SeriousBroker.it  This test is no t yet approved or cleared by the [place]  FDA and  has been authorized for detection and/or diagnosis of SARS-CoV-2 by FDA under an Emergency Use Authorization (EUA). This EUA will remain  in effect (meaning this test can be used) for the duration of the COVID-19 declaration under Section 564(b)(1) of the Act, 21 U.S.C.section 360bbb-3(b)(1), unless the authorization is terminated  or revoked sooner.       Influenza A by PCR NEGATIVE NEGATIVE Final   Influenza B by  PCR NEGATIVE NEGATIVE Final    Comment: (NOTE) The Xpert Xpress SARS-CoV-2/FLU/RSV plus assay is intended as an aid in the diagnosis of influenza from Nasopharyngeal swab specimens and should not be used as a sole basis for treatment. Nasal washings and aspirates are  unacceptable for Xpert Xpress SARS-CoV-2/FLU/RSV testing.  Fact Sheet for Patients: BloggerCourse.com  Fact Sheet for Healthcare Providers: SeriousBroker.it  This test is not yet approved or cleared by the [place]  FDA and has been authorized for detection and/or diagnosis of SARS-CoV-2 by FDA under an Emergency Use Authorization (EUA). This EUA will remain in effect (meaning this test can be used) for the duration of the COVID-19 declaration under Section 564(b)(1) of the Act, 21 U.S.C. section 360bbb-3(b)(1), unless the authorization is terminated or revoked.     Resp Syncytial Virus by PCR NEGATIVE NEGATIVE Final    Comment: (NOTE) Fact Sheet for Patients: BloggerCourse.com  Fact Sheet for Healthcare Providers: SeriousBroker.it  This test is not yet approved or cleared by the [place]  FDA and has been authorized for detection and/or diagnosis of SARS-CoV-2 by FDA under an Emergency Use Authorization (EUA). This EUA will remain in effect (meaning this test can be used) for the duration of the COVID-19 declaration under Section 564(b)(1) of the Act, 21 U.S.C. section 360bbb-3(b)(1), unless the authorization is terminated or revoked.  Performed at [facility], [address] [place], [place] [address]   MRSA Next Gen by PCR, Nasal     Status: None   Collection Time: 02/22/24 12:30 AM   Specimen: Anterior Nasal Swab  Result Value Ref Range Status   MRSA by PCR Next Gen NOT DETECTED NOT DETECTED Final    Comment: (NOTE) The GeneXpert MRSA Assay (FDA approved for NASAL specimens only), is one component of a comprehensive MRSA colonization surveillance program. It is not intended to diagnose MRSA infection nor to guide or monitor treatment for MRSA infections. Test performance is not FDA approved in patients less than 6 years old. Performed at  [facility], [address] [place], [place] [address]     Radiology Reports DG Chest [place] 1 View Result Date: 02/21/2024 CLINICAL DATA:  Recent BBL, abscess on buttocks,fever and ? Of sepsis now, patient is prone due to pain Questionable sepsis - evaluate for abnormality EXAM: PORTABLE CHEST 1 VIEW COMPARISON:  Chest x-ray 11/09/2008 FINDINGS: Limited evaluation due to overlapping osseous structures and overlying soft tissues. The heart and mediastinal contours are within normal limits. No focal consolidation. No pulmonary edema. No pleural effusion. No pneumothorax. No acute osseous abnormality. IMPRESSION: No active disease. Limited evaluation due to overlapping osseous structures and overlying soft tissues. Consider repeat chest x-ray PA and lateral view for further evaluation. Electronically Signed   By: Morgane  Naveau M.D.   On: 02/21/2024 23:16    SIGNED: Adriana DELENA Grams, MD, FHM. FAAFP. Jolynn Pack - [facility] Time spent - 55 min.  In seeing, evaluating and examining the patient. Reviewing medical records, labs, drawn plan of care. [facility],  Pager (please use [email or web address] to page/ text) Please use Epic Secure Chat for non-urgent communication (7AM-7PM)  If 7PM-7AM, please contact night-coverage [email or web address], 02/22/2024, 11:48 AM

## 2024-02-22 NOTE — Anesthesia Preprocedure Evaluation (Addendum)
 Anesthesia Evaluation  Patient identified by MRN, date of birth, ID band Patient awake    Reviewed: Allergy & Precautions, NPO status , Patient's Chart, lab work & pertinent test results  Airway Mallampati: I  TM Distance: >3 FB     Dental no notable dental hx. (+) Teeth Intact, Dental Advisory Given   Pulmonary former smoker   Pulmonary exam normal breath sounds clear to auscultation       Cardiovascular hypertension, Normal cardiovascular exam Rhythm:Regular Rate:Normal  On no Rx   Neuro/Psych  Headaches  negative psych ROS   GI/Hepatic negative GI ROS, Neg liver ROS,,,  Endo/Other  PCOS  Renal/GU Renal diseaseHx/o renal calculi  negative genitourinary   Musculoskeletal Abscess Buttock   Abdominal   Peds  Hematology  (+) Blood dyscrasia, anemia   Anesthesia Other Findings   Reproductive/Obstetrics negative OB ROS HSV                              Anesthesia Physical Anesthesia Plan  ASA: 2 and emergent  Anesthesia Plan:    Post-op Pain Management: Dilaudid  IV, Tylenol  PO (pre-op)* and Precedex   Induction: Intravenous  PONV Risk Score and Plan: 4 or greater and Treatment may vary due to age or medical condition, Midazolam , Ondansetron  and Dexamethasone   Airway Management Planned: Oral ETT  Additional Equipment: None  Intra-op Plan:   Post-operative Plan: Extubation in OR  Informed Consent: I have reviewed the patients History and Physical, chart, labs and discussed the procedure including the risks, benefits and alternatives for the proposed anesthesia with the patient or authorized representative who has indicated his/her understanding and acceptance.     Dental advisory given  Plan Discussed with:   Anesthesia Plan Comments:          Anesthesia Quick Evaluation

## 2024-02-22 NOTE — Op Note (Signed)
 02/21/2024 - 02/22/2024 8:42 AM  PATIENT:  [name] Bass  38 y.o. female  Patient Care Team: Patient, No Pcp Per as PCP - General (General Practice)  PRE-OPERATIVE DIAGNOSIS:   Deep Abscess of right buttock History of Sudan Butt Lift (BBL)  POST-OPERATIVE DIAGNOSIS:   Deep Abscess of right buttock History of Sudan Butt Lift (BBL)  PROCEDURE: INCISION AND DRAINAGE DEEP BUTTOCK ABSCESS  SURGEON:  [name] KYM Schultze, MD  ASSISTANT:  (n/a)   ANESTHESIA:  General endotracheal intubation anesthesia (GETA) and Local & regional field block at incision(s) for perioperative & postoperative pain control provided with 30mL of bupivicaine 0.25% with epinephrine   Estimated Blood Loss (EBL):   Total I/O In: 10 [I.V.:10] Out: - .   (See anesthesia record)  Delay start of Pharmacological VTE agent (>24hrs) due to concerns of significant anemia, surgical blood loss, or risk of bleeding?:  no  DRAINS: (None)  SPECIMEN: Abscess cavity  DISPOSITION OF SPECIMEN:  Microbiology for culture & sensitivity  COUNTS:  Sponge, needle, & instrument counts CORRECT at the conclusion of the case.      PLAN OF CARE: Admit to inpatient   PATIENT DISPOSITION:  PACU - hemodynamically stable.  INDICATION: 38 year old woman.  Underwent BBL (Brazilian Butt Lift) procedure by Dr. Dallas Bass in [place]  on 02/04/2014.  Patient lives in [name].  Has had worsening pain and has been coming to [facility] ERs.  Had 2 aspirations with no resolution.  Worsening pain.  CAT scan shows very deep abscess.  Not amenable to bedside drainage.  There is no Engineer, petroleum on-call or available.  Plastic surgeon asked general surgery to help manage since he does not have privileges in [place].  The anatomy and physiology of the region was discussed. The pathophysiology of subcutaneous abscess formation with progression to fasciitis & sepsis was discussed.  Need for incision, drainage, debridement  discussed.  I stressed good hygiene & need for repeated wound care.  Possible redebridement / reoperation was discussed as well. Possibility of recurrence was discussed.   Risks of bleeding, infection, abscess, leak, injury to other organs, need for repair of tissues / organs, need for further treatment, heart attack, death, and other risks were discussed.  Benefits, alternatives were discussed. I noted a good likelihood this will help address the problem.  Questions answered.  The patient agrees to proceed.   OR FINDINGS: Deep upper right gluteal abscess cavity overlying gluteus fascia 8 cm deep and extending towards the midline.  Frank foul green-gray pus drained and aspirated.  Sent for cultures.  Wound packed with 2 inch rolled Dermasphere gauze.  8 x 6 cm cavity space that extends 10 cm from the 4 x 2 cm wound  CASE DATA: Type of patient?: LDOW CASE (Surgical Hospitalist [facility] Inpatient) Status of Case? EMERGENT Add On Infection Present At Time Of Surgery (PATOS)?  ABSCESS  DESCRIPTION: Patient was identified and brought to the operating room.  Underwent general anesthesia without difficulty.  Position prone jackknife.  CT scan pulled up for visualization and isolation.  Back and buttock was prepped and draped in a sterile fashion.  Surgical timeout confirmed our plan.  I used a 18-gauge spinal needle in the right upper buttock to locate and aspirated deep abscess with frank green-gray foul purulence.  I made a 4 cm transverse incision in the right upper gluteus over the epicenter of the isolated abscess cavity.  And came through 6 cm of subcutaneous tissue fat and inflammation eventually encountered the  frankly purulent pocket.  Sent pus for aerobic and anaerobic cultures.  I used finger to probe and break up loculations until the abscess cavity was isolated and evacuated.  Inspection showed no obvious gangrene or necrotizing fasciitis.  No continuing pneumatosis or undrained collections.  No  obvious fasciitis.  Tissues viable at the base.  Did suction and aspirated until all pus was gone.  Ensured hemostasis.    Wound was packed with 2 inch Kling dermacea rolled gauze soaked in chlorhexidine .  Sterile dressing applied  Patient tolerated procedure.  Given the deep abscess cavity and with her having evidence of significant 20x15cm cellulitis, will continue on IV antibiotics and begin dressing changes in the hope of discharging in a day or 2 if she improves.  Follow-up on cultures to target appropriate antibiotics.  MRSA screening was negative so hold off on vancomycin  for now  I discussed operative findings, updated the patient's status, discussed probable steps to recovery, and gave postoperative recommendations to the patient's brother, [name] Bass.  Recommendations were made.  Questions were answered.  He expressed understanding & appreciation.      [name] Bass, M.D., F.A.C.S. Gastrointestinal and Minimally Invasive Surgery [facility] [address], [address] [place], [place] [address] [phone] Main / Paging

## 2024-02-22 NOTE — Transfer of Care (Signed)
 Immediate Anesthesia Transfer of Care Note  Patient: [name] Bass  Procedure(s) Performed: INCISION AND DRAINAGE, ABSCESS  Patient Location: PACU  Anesthesia Type:General  Level of Consciousness: drowsy and patient cooperative  Airway & Oxygen Therapy: Patient Spontanous Breathing  Post-op Assessment: Report given to RN  Post vital signs: Reviewed and stable  Last Vitals:  Vitals Value Taken Time  BP 117/64 02/22/24 08:53  Temp    Pulse 94 02/22/24 08:55  Resp 14 02/22/24 08:55  SpO2 96 % 02/22/24 08:55  Vitals shown include unfiled device data.  Last Pain:  Vitals:   02/22/24 0603  TempSrc:   PainSc: 6          Complications: No notable events documented.

## 2024-02-22 NOTE — Anesthesia Postprocedure Evaluation (Signed)
 Anesthesia Post Note  Patient: [name] Bass  Procedure(s) Performed: INCISION AND DRAINAGE, ABSCESS     Patient location during evaluation: PACU Anesthesia Type: General Level of consciousness: awake and alert and oriented Pain management: pain level controlled Vital Signs Assessment: post-procedure vital signs reviewed and stable Respiratory status: spontaneous breathing, nonlabored ventilation and respiratory function stable Cardiovascular status: blood pressure returned to baseline and stable Postop Assessment: no apparent nausea or vomiting Anesthetic complications: no   No notable events documented.  Last Vitals:  Vitals:   02/22/24 0915 02/22/24 0924  BP: 122/62 (!) 97/55  Pulse: 78 72  Resp: 11 16  Temp:  36.6 C  SpO2: 93% 94%    Last Pain:  Vitals:   02/22/24 0924  TempSrc:   PainSc: 0-No pain                 Ahmon Tosi A.

## 2024-02-22 NOTE — Discharge Instructions (Signed)
 WOUND CARE  It is important that the wound be kept open.   -Keeping the skin edges apart will allow the wound to gradually heal from the base upwards.   - If the skin edges of the wound close too early, a new fluid pocket can form and infection can occur. -This is the reason to pack deeper wounds with gauze or ribbon -This is why drained wounds cannot be sewed closed right away  A healthy wound should form a lining of bright red beefy granulating tissue that will help shrink the wound and help the edges grow new skin into it.   -A little mucus / yellow discharge is normal (the body's natural way to try and form a scab) and should be gently washed off with soap and water with daily dressing changes.  -Green or foul smelling drainage implies bacterial colonization and can slow wound healing - a short course of antibiotic ointment (3-5 days) can help it clear up.  Call the doctor if it does not improve or worsens  -Avoid use of antibiotic ointments for more than a week as they can slow wound healing over time.    -Sometimes other wound care products will be used to reduce need for dressing changes and/or help clean up dirty wounds -Sometimes the surgeon needs to debride the wound in the office to remove dead or infected tissue out of the wound so it can heal more quickly and safely.    Change the dressing at least once a day -Wash the wound with mild soap and water gently every day.  It is good to shower or bathe the wound to help it clean out. -Use clean 4x4 gauze for medium/large wounds or ribbon plain NU-gauze for smaller wounds (it does not need to be sterile, just clean) -Keep the raw wound moist with a little saline or KY (saline) gel on the gauze.  -A dry wound will take longer to heal.  -Keep the skin dry around the wound to prevent breakdown and irritation. -Pack the wound down to the base (4 inches deep) with Shera / Dermacea 2 inch rolled gauze -The goal is to keep the skin apart, not  overpack the wound -Use a Q-tip or blunt-tipped kabob stick toothpick to push the gauze down to the base in narrow or deep wounds   -Cover with a clean gauze and tape -paper or Medipore tape tend to be gentle on the skin -rotate the orientation of the tape to avoid repeated stress/trauma on the skin -using an ACE or Coban wrap on wounds on arms or legs can be used instead.  Complete all antibiotics through the entire prescription to help the infection heal and prevent new places of infection   Returning the see the surgeon is helpful to follow the healing process and help the wound close as fast as possible.

## 2024-02-22 NOTE — Anesthesia Procedure Notes (Signed)
 Procedure Name: Intubation Date/Time: 02/22/2024 8:13 AM  Performed by: Delores Duwaine SAUNDERS, CRNAPre-anesthesia Checklist: Patient identified, Emergency Drugs available, Suction available and Patient being monitored Patient Re-evaluated:Patient Re-evaluated prior to induction Oxygen Delivery Method: Circle System Utilized Preoxygenation: Pre-oxygenation with 100% oxygen Induction Type: IV induction Ventilation: Mask ventilation without difficulty Laryngoscope Size: Mac and 3 Grade View: Grade I Tube type: Oral Number of attempts: 1 Airway Equipment and Method: Stylet and Oral airway Placement Confirmation: ETT inserted through vocal cords under direct vision, positive ETCO2 and breath sounds checked- equal and bilateral Secured at: 21 cm Tube secured with: Tape Dental Injury: Teeth and Oropharynx as per pre-operative assessment

## 2024-02-22 NOTE — Hospital Course (Addendum)
 [name] Bass is a 38 y.o. female with medical history significant of Brazilian butt lift procedure postop day 16 presented to emergency for the second time to bed complaining of worsening fever, persistent pain. Patient had recent [place] butt lift procedure done by Dr. Aisha at outpatient surgical center in [facility] 02/05/2024.  5 days ago patient had a pain started on the right buttock with associated fever.   She was seen by her surgeon reported aspirated the area of the right buttock and started on Bactrim  however presented to ED for the second time today for persistent pain.  In the ED 02/21/2024 patient found to have suspected cellulitis and questionable abscess of the right buttock confirmed on the CT imaging subsequently underwent aspiration around 10 cc purulent discharge and patient was sent home to follow-up with surgeon in the office 7/5. 7/3 ED today- revealed concern for abscess formation. ED physician initially spoke with patient's or general surgeon Dr. Aisha who stated that he does not have any hospital privilege to do any surgical procedure and recommended to speak with our inpatient surgeon for further evaluation.    Dr. Garnette Schultze has been consulted..  Per general surgery no suspicion for Fournier's gangrene/necrotic fasciitis at this time.  Recommended IV antibiotic coverage and plan to take her OR in the morning 7/4- plan for I&D.     At presentation to ED patient is tachycardic otherwise hemodynamically stable. CBC showing leukocytosis 19.5, stable H&H normal platelet count. CMP showing low bicarb 21, low albumin 3.1  Lactic acid within normal range. Pregnancy test negative. EKG shows sinus tachycardia heart rate 128. Blood cultures are in process.  CT abdomen pelvis from 7/2 showed 5.2 cm x 6.0 cm x 7.3 cm abscess within the subcutaneous fat along the posterolateral pelvic wall on the right with additional areas of adjacent cellulitis and subcutaneous  emphysema. 2. Bilateral nonobstructing renal calculi. 3. Evidence of prior cholecystectomy.   Was given IV vancomycin , cefepime -General Surgery ordered IV cefazolin  and metronidazole  for surgical prophylaxis and 1 L of LR bolus followed by continue LR 150 cc/h.   Hospitalist has been consulted for further management of postop complication associated with posterolateral pelvic wall abscess  -----------------------------------------------------------------------------    Assessment & Plan:   Principal Problem:   Abscess of left buttock       Assessment and Plan:  Postop complication-posterolateral right pelvic wall abscess -  -Patient recently 02/05/2024 has [place] butt lift procedure which has been uncomplicated with abscess formation.   -Primary plastic surgeon Dr. Aisha  - Consulted general surgeon Dr. Garnette Schultze 02/22/2024-s/p right Botox abscess I&D -   - CT abdomen pelvis showed 5.2 cm x 6.0 cm x 7.3 cm abscess within the subcutaneous fat along the posterolateral pelvic wall on the right with additional areas of adjacent cellulitis and subcutaneous emphysema.  -Continue current antibiotics of IV cefazolin , Flagyl  - Continue IVF  -Need to follow-up with blood cultures and pelvicwall abscess result for appropriate antibiotic guidance.    Acute on chronic anemia- -postop mild drop in hemoglobin, monitoring closely -Will transfuse if hemoglobin drops below 7.0 or patient becomes symptomatic or hypotensive -Checking baseline iron levels

## 2024-02-22 NOTE — Interval H&P Note (Signed)
 History and Physical Interval Note:  02/22/2024 7:01 AM  [name] Bass  has presented today for surgery, with the diagnosis of Abscess Of Buttocks.  The various methods of treatment have been discussed with the patient and family. After consideration of risks, benefits and other options for treatment, the patient has consented to  Procedure(s) with comments: INCISION AND DRAINAGE, ABSCESS (N/A) - I & D Buttocks as a surgical intervention.  The patient's history has been reviewed, patient examined, no change in status, stable for surgery.  I have reviewed the patient's chart and labs.  Questions were answered to the patient's satisfaction.    I have re-reviewed the the patient's records, history, medications, and allergies.  I have re-examined the patient.  I again discussed intraoperative plans and goals of post-operative recovery.  The patient agrees to proceed.  [name] Bass  21-Oct-1985 [id number]  Patient Care Team: Patient, No Pcp Per as PCP - General (General Practice)  Patient Active Problem List   Diagnosis Date Noted   Abscess of left buttock 02/22/2024   Nephrolithiasis 01/17/2020   Constipation 03/04/2015   Enteritis 03/03/2015   Hypokalemia 03/03/2015   Ileus (HCC) 03/03/2015   Abdominal pain 03/03/2015   Headache 03/03/2015   Vaginal delivery 08/04/2012   Bilateral kidney stones 07/08/2012   Abnormal glucose tolerance test in pregnancy, antepartum 07/08/2012   Unsure of LMP  07/08/2012   Normal pregnancy 01/17/2012   Abnormal Pap smear    Polycystic ovarian syndrome    CIN I (cervical intraepithelial neoplasia I)    Herpes simplex virus (HSV) infection    FHx: suicide    HSV (herpes simplex virus) anogenital infection 12/02/2011   High risk HPV infection 11/28/2011   History of PCOS 11/28/2011   Cervical intraepithelial neoplasia I 11/28/2011   Lower abdominal pain 02/28/2011    Class: Acute    Past Medical History:  Diagnosis Date   Abnormal Pap smear     Bacterial vaginosis 04/29/04   Chronic kidney disease    kidney stones   CIN I (cervical intraepithelial neoplasia I)    Dyspareunia 05/02/10   Dysuria 04/04/05   FHx: suicide    H/O amenorrhea 02/19/04   Herpes simplex type 2 infection    last outbreak 3 weeks ago   Herpes simplex without mention of complication    High risk HPV infection 08/01/07   History of kidney stones    Hx: UTI (urinary tract infection)    Hypertension    Infection    UTI   Irregular periods/menstrual cycles    Polycystic ovarian syndrome 05/08/05   Vaginal Pap smear, abnormal    Vaginitis and vulvovaginitis 04/29/04    Past Surgical History:  Procedure Laterality Date   [place] Butt Lift     CHOLECYSTECTOMY, LAPAROSCOPIC  08/21/2008   CYSTOSCOPY WITH RETROGRADE PYELOGRAM, URETEROSCOPY AND STENT PLACEMENT Bilateral 01/17/2020   Procedure: CYSTOSCOPY WITH RETROGRADE PYELOGRAM, URETEROSCOPY bilateral right double j stent placement;  Surgeon: Sherrilee Belvie CROME, MD;  Location: [facility];  Service: Urology;  Laterality: Bilateral;  1 HR   CYSTOSCOPY WITH URETEROSCOPY  03/28/2012   Procedure: CYSTOSCOPY WITH URETEROSCOPY;  Surgeon: Garnette Shack, MD;  Location: [facility];  Service: Urology;  Laterality: Right;  RIGHT URETEROSCOPIC STONE EXTRACTION W/ LASER (Pt is [redacted] wk pregnant)     CYSTOSCOPY/URETEROSCOPY/HOLMIUM LASER/STENT PLACEMENT Left 10/20/2021   Procedure: CYSTOSCOPY LEFT RETROGRADE PYELOGRAM, URETERAL BALLOON DILATION, URETEROSCOPY/HOLMIUM LASER/STENT PLACEMENT;  Surgeon: Cam Morene ORN, MD;  Location: [facility];  Service:  Urology;  Laterality: Left;   EXTRACORPOREAL SHOCK WAVE LITHOTRIPSY Left 11/09/2016   Procedure: EXTRACORPOREAL SHOCK WAVE LITHOTRIPSY (ESWL);  Surgeon: Morene LELON Salines, MD;  Location: [facility];  Service: Urology;  Laterality: Left;   HOLMIUM LASER APPLICATION Bilateral 01/17/2020   Procedure: HOLMIUM LASER APPLICATION;  Surgeon: Sherrilee Belvie CROME, MD;   Location: [facility];  Service: Urology;  Laterality: Bilateral;   STONE EXTRACTION WITH BASKET  03/28/2012   Procedure: STONE EXTRACTION WITH BASKET;  Surgeon: Garnette Shack, MD;  Location: [facility];  Service: Urology;  Laterality: Right;   TONSILLECTOMY  08/21/2004   TUBAL LIGATION  08/04/2012   Procedure: POST PARTUM TUBAL LIGATION;  Surgeon: Shanda SHAUNNA Muscat, MD;  Location: [facility];  Service: Gynecology;  Laterality: Bilateral;  Bilateral post partum tubal ligation   WISDOM TOOTH EXTRACTION  08/22/1999    Social History   Socioeconomic History   Marital status: Single    Spouse name: Not on file   Number of children: 3   Years of education: 12   Highest education level: Not on file  Occupational History   Not on file  Tobacco Use   Smoking status: Former   Smokeless tobacco: Never   Tobacco comments:    Jan 2015  Vaping Use   Vaping status: Never Used  Substance and Sexual Activity   Alcohol use: Yes    Alcohol/week: 0.0 standard drinks of alcohol    Comment: occasional   Drug use: No   Sexual activity: Yes    Partners: Male    Birth control/protection: Surgical  Other Topics Concern   Not on file  Social History Narrative   Not on file   Social Drivers of Health   Financial Resource Strain: Not on file  Food Insecurity: No Food Insecurity (02/22/2024)   Hunger Vital Sign    Worried About Running Out of Food in the Last Year: Never true    Ran Out of Food in the Last Year: Never true  Transportation Needs: No Transportation Needs (02/22/2024)   PRAPARE - Administrator, Civil Service (Medical): No    Lack of Transportation (Non-Medical): No  Physical Activity: Not on file  Stress: Not on file  Social Connections: Not on file  Intimate Partner Violence: Not At Risk (02/22/2024)   Humiliation, Afraid, Rape, and Kick questionnaire    Fear of Current or Ex-Partner: No    Emotionally Abused: No    Physically Abused: No    Sexually Abused: No    Family  History  Problem Relation Age of Onset   Heart disease Father        heart attack in 60.  lm   Cancer Maternal Uncle        bone cancer   Diabetes Maternal Uncle    Cancer Maternal Grandmother        ovarian cancer   Suicidality Mother    Diabetes Maternal Aunt    Cancer Maternal Aunt        lung   Hypertension Paternal Uncle     Medications Prior to Admission  Medication Sig Dispense Refill Last Dose/Taking   Ibuprofen  200 MG CAPS Take 200-800 mg by mouth every 6 (six) hours as needed (for pain).   02/21/2024 Evening   sulfamethoxazole -trimethoprim  (BACTRIM  DS) 800-160 MG tablet Take 1 tablet by mouth 2 (two) times daily.   02/21/2024 Morning   TYLENOL  325 MG tablet Take 650 mg by mouth every 6 (six) hours as needed (for pain).  02/21/2024 Noon   naproxen  (NAPROSYN ) 500 MG tablet Take 1 tablet (500 mg total) by mouth 2 (two) times daily as needed for moderate pain. (Patient not taking: Reported on 02/21/2024) 30 tablet 0 Not Taking   ondansetron  (ZOFRAN ) 4 MG tablet Take 1 tablet (4 mg total) by mouth every 6 (six) hours. (Patient not taking: Reported on 02/21/2024) 12 tablet 0 Not Taking   oxyCODONE -acetaminophen  (PERCOCET/ROXICET) 5-325 MG tablet Take 1 tablet by mouth every 6 (six) hours as needed for severe pain. (Patient not taking: Reported on 02/21/2024) 10 tablet 0 Not Taking   phenazopyridine  (PYRIDIUM ) 200 MG tablet Take 1 tablet (200 mg total) by mouth 3 (three) times daily as needed for pain. (Patient not taking: Reported on 02/21/2024) 10 tablet 0 Not Taking   senna-docusate (SENOKOT-S) 8.6-50 MG tablet Take 1 tablet by mouth at bedtime as needed for mild constipation or moderate constipation. (Patient not taking: Reported on 02/21/2024) 30 tablet 0 Not Taking   traMADol  (ULTRAM ) 50 MG tablet Take 1-2 tablets (50-100 mg total) by mouth every 6 (six) hours as needed for moderate pain. (Patient not taking: Reported on 02/21/2024) 15 tablet 0 Not Taking    Current Facility-Administered  Medications  Medication Dose Route Frequency Provider Last Rate Last Admin   [MAR Hold] 0.9 %  sodium chloride  infusion  250 mL Intravenous PRN Sundil, Subrina, MD       [MAR Hold] acetaminophen  (TYLENOL ) tablet 650 mg  650 mg Oral Q6H PRN Sundil, Subrina, MD       Or   ILDA Hold] acetaminophen  (TYLENOL ) suppository 650 mg  650 mg Rectal Q6H PRN Sundil, Subrina, MD       [MAR Hold] acetaminophen  (TYLENOL ) tablet 1,000 mg  1,000 mg Oral Q6H Sundil, Subrina, MD   1,000 mg at 02/22/24 0603   [MAR Hold] acetaminophen  (TYLENOL ) tablet 1,000 mg  1,000 mg Oral On Call to OR Sundil, Subrina, MD       [MAR Hold] alum & mag hydroxide-simeth (MAALOX/MYLANTA) 200-200-20 MG/5ML suspension 30 mL  30 mL Oral Q6H PRN Sundil, Subrina, MD       [MAR Hold] ceFAZolin  (ANCEF ) IVPB 2g/100 mL premix  2 g Intravenous Q8H Sundil, Subrina, MD 200 mL/hr at 02/22/24 0557 2 g at 02/22/24 0557   [MAR Hold] ceFAZolin  (ANCEF ) IVPB 2g/100 mL premix  2 g Intravenous On Call to OR Sundil, Subrina, MD       [MAR Hold] celecoxib  (CELEBREX ) capsule 200 mg  200 mg Oral On Call to OR Sundil, Subrina, MD       [MAR Hold] Chlorhexidine  Gluconate Cloth 2 % PADS 6 each  6 each Topical Once Sundil, Subrina, MD       [MAR Hold] dextrose  50 % solution 50 mL  50 mL Intravenous PRN Sundil, Subrina, MD       [MAR Hold] diphenhydrAMINE  (BENADRYL ) injection 12.5-25 mg  12.5-25 mg Intravenous Q6H PRN Sundil, Subrina, MD       [MAR Hold] gabapentin  (NEURONTIN ) capsule 300 mg  300 mg Oral On Call to OR Sundil, Subrina, MD       [MAR Hold] heparin  injection 5,000 Units  5,000 Units Subcutaneous Q8H Sundil, Subrina, MD       [MAR Hold] HYDROcodone -acetaminophen  (NORCO/VICODIN) 5-325 MG per tablet 1-2 tablet  1-2 tablet Oral Q4H PRN Sundil, Subrina, MD       [MAR Hold] HYDROmorphone  (DILAUDID ) injection 0.5-2 mg  0.5-2 mg Intravenous Q2H PRN Sundil, Subrina, MD   1 mg at 02/22/24  0237   [MAR Hold] lactated ringers  bolus 1,000 mL  1,000 mL Intravenous Q8H  PRN Sundil, Subrina, MD       lactated ringers  infusion   Intravenous Continuous Sundil, Subrina, MD 125 mL/hr at 02/22/24 0423 New Bag at 02/22/24 0423   [MAR Hold] LORazepam  (ATIVAN ) tablet 0.5 mg  0.5 mg Oral Q6H PRN Sundil, Subrina, MD       [MAR Hold] magic mouthwash  15 mL Oral QID PRN Sundil, Subrina, MD       [MAR Hold] menthol -cetylpyridinium (CEPACOL) lozenge 3 mg  1 lozenge Oral PRN Sundil, Subrina, MD       [MAR Hold] methocarbamol  (ROBAXIN ) injection 1,000 mg  1,000 mg Intravenous Q6H PRN Sundil, Subrina, MD       [MAR Hold] methocarbamol  (ROBAXIN ) tablet 1,000 mg  1,000 mg Oral Q6H PRN Sundil, Subrina, MD       [MAR Hold] metoprolol  tartrate (LOPRESSOR ) injection 5 mg  5 mg Intravenous Q6H PRN Sundil, Subrina, MD       [MAR Hold] metroNIDAZOLE  (FLAGYL ) IVPB 500 mg  500 mg Intravenous Q12H Sundil, Subrina, MD 100 mL/hr at 02/22/24 0048 500 mg at 02/22/24 0048   [MAR Hold] naphazoline-glycerin  (CLEAR EYES REDNESS) ophth solution 1-2 drop  1-2 drop Both Eyes QID PRN Sundil, Subrina, MD       [MAR Hold] ondansetron  (ZOFRAN ) injection 4 mg  4 mg Intravenous Q6H PRN Sundil, Subrina, MD       [MAR Hold] phenol (CHLORASEPTIC) mouth spray 2 spray  2 spray Mouth/Throat PRN Sundil, Subrina, MD       [MAR Hold] simethicone  (MYLICON) 40 MG/0.6ML suspension 80 mg  80 mg Oral QID PRN Sundil, Subrina, MD       [MAR Hold] sodium chloride  (OCEAN) 0.65 % nasal spray 1-2 spray  1-2 spray Each Nare Q6H PRN Sundil, Subrina, MD       [MAR Hold] sodium chloride  flush (NS) 0.9 % injection 10-40 mL  10-40 mL Intracatheter Q12H Sundil, Subrina, MD   10 mL at 02/22/24 0032   [MAR Hold] sodium chloride  flush (NS) 0.9 % injection 3 mL  3 mL Intravenous Q12H Sundil, Subrina, MD   3 mL at 02/22/24 0033   [MAR Hold] sodium chloride  flush (NS) 0.9 % injection 3 mL  3 mL Intravenous Q12H Sundil, Subrina, MD   3 mL at 02/22/24 0033   [MAR Hold] sodium chloride  flush (NS) 0.9 % injection 3 mL  3 mL Intravenous PRN Sundil,  Subrina, MD         Allergies  Allergen Reactions   Amoxicillin Rash and Other (See Comments)    Has patient had a PCN reaction causing immediate rash, facial/tongue/throat swelling, SOB or lightheadedness with hypotension: No Has patient had a PCN reaction causing severe rash involving mucus membranes or skin necrosis: No Has patient had a PCN reaction that required hospitalization No Has patient had a PCN reaction occurring within the last 10 years: No If all of the above answers are NO, then may proceed with Cephalosporin use.   Egg-Derived Products Rash    BP 114/64 (BP Location: Right Arm)   Pulse 83   Temp 98.7 F (37.1 C)   Resp 11   Ht 5' 10 (1.778 m)   Wt 82.3 kg   SpO2 97%   BMI 26.04 kg/m    Constitutional: Not cachectic.  Hygeine adequate.  Vitals signs as above.   Eyes: Pupils reactive, normal extraocular movements. Sclera nonicteric Neuro: CN II-XII intact.  No major focal sensory defects.  No major motor deficits. Lymph: No head/neck/groin lymphadenopathy Psych:  No severe agitation.  No severe anxiety.  Judgment & insight Adequate, Oriented x4, HENT: Normocephalic, Mucus membranes moist.  No thrush.   Neck: Supple, No tracheal deviation.  No obvious thyromegaly Chest: No pain to chest wall compression.  Good respiratory excursion.  No audible wheezing CV:  Pulses intact.  regular rhythm.  No major extremity edema  Abdomen:  Flat Hernia: Not present. Diastasis recti: Not present. Soft.   Nondistended.  Nontender.  No hepatomegaly.  No splenomegaly  Gen:  Inguinal hernia: Not present.  Inguinal lymph nodes: without lymphadenopathy.    Rectal: ecchymosis both buttocks with pain over right buttock  Ext: No obvious deformity or contracture.  Edema: Not present.  No cyanosis Skin: No major subcutaneous nodules.  Warm and dry Musculoskeletal: Severe joint rigidity not present.  No obvious clubbing.  No digital petechiae.     Labs: Results for orders placed  or performed during the hospital encounter of 02/21/24 (from the past 48 hours)  Blood Culture (routine x 2)     Status: None (Preliminary result)   Collection Time: 02/21/24 10:50 PM   Specimen: BLOOD  Result Value Ref Range   Specimen Description      BLOOD BLOOD RIGHT FOREARM Performed at [facility], [address] [place], [place] [address]    Special Requests      BOTTLES DRAWN AEROBIC ONLY Blood Culture adequate volume Performed at [facility], [address] [place], [place] [address]    Culture      NO GROWTH < 12 HOURS Performed at [facility], [address], [place], [place] [address]    Report Status PENDING   I-Stat Lactic Acid, ED     Status: None   Collection Time: 02/21/24 11:07 PM  Result Value Ref Range   Lactic Acid, Venous 0.8 0.5 - 1.9 mmol/L  Comprehensive metabolic panel     Status: Abnormal   Collection Time: 02/21/24 11:08 PM  Result Value Ref Range   Sodium 135 135 - 145 mmol/L   Potassium 3.6 3.5 - 5.1 mmol/L   Chloride 105 98 - 111 mmol/L   CO2 21 (L) 22 - 32 mmol/L   Glucose, Bld 119 (H) 70 - 99 mg/dL    Comment: Glucose reference range applies only to samples taken after fasting for at least 8 hours.   BUN 10 6 - 20 mg/dL   Creatinine, Ser 9.45 0.44 - 1.00 mg/dL   Calcium  8.6 (L) 8.9 - 10.3 mg/dL   Total Protein 6.3 (L) 6.5 - 8.1 g/dL   Albumin 3.1 (L) 3.5 - 5.0 g/dL   AST 16 15 - 41 U/L   ALT 20 0 - 44 U/L   Alkaline Phosphatase 75 38 - 126 U/L   Total Bilirubin 0.7 0.0 - 1.2 mg/dL   GFR, Estimated >39 >39 mL/min    Comment: (NOTE) Calculated using the CKD-EPI Creatinine Equation (2021)    Anion gap 9 5 - 15    Comment: Performed at [facility], [address] [place], [place] [address]  CBC with Differential     Status: Abnormal   Collection Time: 02/21/24 11:08 PM  Result Value Ref Range   WBC 19.5 (H) 4.0 - 10.5 K/uL   RBC 3.17 (L) 3.87 - 5.11 MIL/uL   Hemoglobin  10.0 (L) 12.0 - 15.0 g/dL   HCT 70.4 (  L) 36.0 - 46.0 %   MCV 93.1 80.0 - 100.0 fL   MCH 31.5 26.0 - 34.0 pg   MCHC 33.9 30.0 - 36.0 g/dL   RDW 88.0 88.4 - 84.4 %   Platelets 431 (H) 150 - 400 K/uL   nRBC 0.0 0.0 - 0.2 %   Neutrophils Relative % 87 %   Neutro Abs 16.8 (H) 1.7 - 7.7 K/uL   Lymphocytes Relative 5 %   Lymphs Abs 1.0 0.7 - 4.0 K/uL   Monocytes Relative 6 %   Monocytes Absolute 1.2 (H) 0.1 - 1.0 K/uL   Eosinophils Relative 1 %   Eosinophils Absolute 0.3 0.0 - 0.5 K/uL   Basophils Relative 1 %   Basophils Absolute 0.1 0.0 - 0.1 K/uL   Immature Granulocytes 0 %   Abs Immature Granulocytes 0.07 0.00 - 0.07 K/uL    Comment: Performed at [facility], [address], [place], [place] [address]  hCG, serum, qualitative     Status: None   Collection Time: 02/21/24 11:08 PM  Result Value Ref Range   Preg, Serum NEGATIVE NEGATIVE    Comment:        THE SENSITIVITY OF THIS METHODOLOGY IS >10 mIU/mL. Performed at [facility], [address], [place], [place] [address]   Blood Culture (routine x 2)     Status: None (Preliminary result)   Collection Time: 02/22/24 12:20 AM   Specimen: BLOOD  Result Value Ref Range   Specimen Description      BLOOD BLOOD RIGHT ARM Performed at [facility], [address] [place], [place] [address]    Special Requests      BOTTLES DRAWN AEROBIC AND ANAEROBIC Blood Culture results may not be optimal due to an inadequate volume of blood received in culture bottles Performed at [facility], [address] [place], [place] [address]    Culture      NO GROWTH < 12 HOURS Performed at [facility], [address], [place], [place] [address]    Report Status PENDING   Protime-INR     Status: None   Collection Time: 02/22/24 12:20 AM  Result Value Ref Range   Prothrombin Time 14.8 11.4 - 15.2 seconds   INR 1.1 0.8 - 1.2    Comment: (NOTE) INR goal varies  based on device and disease states. Performed at [facility], [address] [place], [place] [address]   Resp panel by RT-PCR (RSV, Flu A&B, Covid) Anterior Nasal Swab     Status: None   Collection Time: 02/22/24 12:30 AM   Specimen: Anterior Nasal Swab  Result Value Ref Range   SARS Coronavirus 2 by RT PCR NEGATIVE NEGATIVE    Comment: (NOTE) SARS-CoV-2 target nucleic acids are NOT DETECTED.  The SARS-CoV-2 RNA is generally detectable in upper respiratory specimens during the acute phase of infection. The lowest concentration of SARS-CoV-2 viral copies this assay can detect is 138 copies/mL. A negative result does not preclude SARS-Cov-2 infection and should not be used as the sole basis for treatment or other patient management decisions. A negative result may occur with  improper specimen collection/handling, submission of specimen other than nasopharyngeal swab, presence of viral mutation(s) within the areas targeted by this assay, and inadequate number of viral copies(<138 copies/mL). A negative result must be combined with clinical observations, patient history, and epidemiological information. The expected result is Negative.  Fact Sheet for Patients:  BloggerCourse.com  Fact Sheet for Healthcare Providers:  SeriousBroker.it  This test is no t yet approved or cleared by the [place]  FDA and  has been authorized for detection and/or diagnosis of SARS-CoV-2 by FDA under an Emergency Use Authorization (EUA). This EUA will remain  in effect (meaning this test can be used) for the duration of the COVID-19 declaration under Section 564(b)(1) of the Act, 21 U.S.C.section 360bbb-3(b)(1), unless the authorization is terminated  or revoked sooner.       Influenza A by PCR NEGATIVE NEGATIVE   Influenza B by PCR NEGATIVE NEGATIVE    Comment: (NOTE) The Xpert Xpress SARS-CoV-2/FLU/RSV plus assay is  intended as an aid in the diagnosis of influenza from Nasopharyngeal swab specimens and should not be used as a sole basis for treatment. Nasal washings and aspirates are unacceptable for Xpert Xpress SARS-CoV-2/FLU/RSV testing.  Fact Sheet for Patients: BloggerCourse.com  Fact Sheet for Healthcare Providers: SeriousBroker.it  This test is not yet approved or cleared by the [place]  FDA and has been authorized for detection and/or diagnosis of SARS-CoV-2 by FDA under an Emergency Use Authorization (EUA). This EUA will remain in effect (meaning this test can be used) for the duration of the COVID-19 declaration under Section 564(b)(1) of the Act, 21 U.S.C. section 360bbb-3(b)(1), unless the authorization is terminated or revoked.     Resp Syncytial Virus by PCR NEGATIVE NEGATIVE    Comment: (NOTE) Fact Sheet for Patients: BloggerCourse.com  Fact Sheet for Healthcare Providers: SeriousBroker.it  This test is not yet approved or cleared by the [place]  FDA and has been authorized for detection and/or diagnosis of SARS-CoV-2 by FDA under an Emergency Use Authorization (EUA). This EUA will remain in effect (meaning this test can be used) for the duration of the COVID-19 declaration under Section 564(b)(1) of the Act, 21 U.S.C. section 360bbb-3(b)(1), unless the authorization is terminated or revoked.  Performed at [facility], [address], [place], [place] [address]   MRSA Next Gen by PCR, Nasal     Status: None   Collection Time: 02/22/24 12:30 AM   Specimen: Anterior Nasal Swab  Result Value Ref Range   MRSA by PCR Next Gen NOT DETECTED NOT DETECTED    Comment: (NOTE) The GeneXpert MRSA Assay (FDA approved for NASAL specimens only), is one component of a comprehensive MRSA colonization surveillance program. It is not intended to diagnose MRSA  infection nor to guide or monitor treatment for MRSA infections. Test performance is not FDA approved in patients less than 60 years old. Performed at [facility], [address] [place], [place] [address]   Comprehensive metabolic panel     Status: Abnormal   Collection Time: 02/22/24  4:05 AM  Result Value Ref Range   Sodium 137 135 - 145 mmol/L   Potassium 3.6 3.5 - 5.1 mmol/L   Chloride 103 98 - 111 mmol/L   CO2 24 22 - 32 mmol/L   Glucose, Bld 107 (H) 70 - 99 mg/dL    Comment: Glucose reference range applies only to samples taken after fasting for at least 8 hours.   BUN 8 6 - 20 mg/dL   Creatinine, Ser 9.36 0.44 - 1.00 mg/dL   Calcium  8.4 (L) 8.9 - 10.3 mg/dL   Total Protein 5.6 (L) 6.5 - 8.1 g/dL   Albumin 2.7 (L) 3.5 - 5.0 g/dL   AST 45 (H) 15 - 41 U/L   ALT 36 0 - 44 U/L  Alkaline Phosphatase 90 38 - 126 U/L   Total Bilirubin 0.8 0.0 - 1.2 mg/dL   GFR, Estimated >39 >39 mL/min    Comment: (NOTE) Calculated using the CKD-EPI Creatinine Equation (2021)    Anion gap 10 5 - 15    Comment: Performed at [facility], [address] [place], [place] [address]  CBC     Status: Abnormal   Collection Time: 02/22/24  4:05 AM  Result Value Ref Range   WBC 17.7 (H) 4.0 - 10.5 K/uL   RBC 2.79 (L) 3.87 - 5.11 MIL/uL   Hemoglobin 8.7 (L) 12.0 - 15.0 g/dL   HCT 73.5 (L) 63.9 - 53.9 %   MCV 94.6 80.0 - 100.0 fL   MCH 31.2 26.0 - 34.0 pg   MCHC 33.0 30.0 - 36.0 g/dL   RDW 88.0 88.4 - 84.4 %   Platelets 375 150 - 400 K/uL   nRBC 0.0 0.0 - 0.2 %    Comment: Performed at [facility], [address] [place], [place] [address]  APTT     Status: None   Collection Time: 02/22/24  4:05 AM  Result Value Ref Range   aPTT 36 24 - 36 seconds    Comment: Performed at [facility], [address], [place], [place] [address]  Protime-INR     Status: Abnormal   Collection Time: 02/22/24  4:05 AM  Result Value  Ref Range   Prothrombin Time 15.3 (H) 11.4 - 15.2 seconds   INR 1.1 0.8 - 1.2    Comment: (NOTE) INR goal varies based on device and disease states. Performed at [facility], [address] [place], [place] [address]     Imaging / Studies: DG Chest Port 1 View Result Date: 02/21/2024 CLINICAL DATA:  Recent BBL, abscess on buttocks,fever and ? Of sepsis now, patient is prone due to pain Questionable sepsis - evaluate for abnormality EXAM: PORTABLE CHEST 1 VIEW COMPARISON:  Chest x-ray 11/09/2008 FINDINGS: Limited evaluation due to overlapping osseous structures and overlying soft tissues. The heart and mediastinal contours are within normal limits. No focal consolidation. No pulmonary edema. No pleural effusion. No pneumothorax. No acute osseous abnormality. IMPRESSION: No active disease. Limited evaluation due to overlapping osseous structures and overlying soft tissues. Consider repeat chest x-ray PA and lateral view for further evaluation. Electronically Signed   By: Morgane  Naveau M.D.   On: 02/21/2024 23:16   CT ABDOMEN PELVIS W CONTRAST Result Date: 02/21/2024 CLINICAL DATA:  Fever with dizziness, headache, fever, chills and nausea. EXAM: CT ABDOMEN AND PELVIS WITH CONTRAST TECHNIQUE: Multidetector CT imaging of the abdomen and pelvis was performed using the standard protocol following bolus administration of intravenous contrast. RADIATION DOSE REDUCTION: This exam was performed according to the departmental dose-optimization program which includes automated exposure control, adjustment of the mA and/or kV according to patient size and/or use of iterative reconstruction technique. CONTRAST:  OMNIPAQUE  IOHEXOL  300 MG/ML  SOLN COMPARISON:  October 25, 2021 FINDINGS: Lower chest: No acute abnormality. Hepatobiliary: No focal liver abnormality is seen. Status post cholecystectomy. No biliary dilatation. Pancreas: Unremarkable. No pancreatic ductal dilatation or surrounding  inflammatory changes. Spleen: Normal in size without focal abnormality. Adrenals/Urinary Tract: Adrenal glands are unremarkable. Kidneys are normal, without obstructing renal calculi or focal lesions. Prominent bilateral extrarenal pelvis are seen with mild to moderate severity right-sided hydroureter also noted. A 2 mm nonobstructing renal calculus is seen within the lower pole of the  right kidney. 1 mm nonobstructing renal calculi are seen within the mid left kidney. Bladder is unremarkable. Stomach/Bowel: Stomach is within normal limits. Appendix appears normal. No evidence of bowel wall thickening, distention, or inflammatory changes. Vascular/Lymphatic: No significant vascular findings are present. No enlarged abdominal or pelvic lymph nodes. Reproductive: Uterus and bilateral adnexa are unremarkable. Other: No abdominal wall hernia or abnormality. No abdominopelvic ascites. Musculoskeletal: A 5.2 cm x 6.0 cm x 7.3 cm collection of fluid and air is seen within the subcutaneous fat along the posterolateral pelvic wall on the right. Moderate to marked severity surrounding inflammatory fat stranding is seen. Patchy, moderate severity areas of subcutaneous inflammatory fat stranding and subcentimeter foci of soft tissue air are seen inferior to this region. Diffuse mild to moderate severity inflammatory fat stranding is present throughout the remaining portions of the posterior, posterolateral and lateral pelvic walls, bilaterally. No acute osseous abnormalities are identified. IMPRESSION: 1. 5.2 cm x 6.0 cm x 7.3 cm abscess within the subcutaneous fat along the posterolateral pelvic wall on the right with additional areas of adjacent cellulitis and subcutaneous emphysema. 2. Bilateral nonobstructing renal calculi. 3. Evidence of prior cholecystectomy. Electronically Signed   By: Suzen Dials M.D.   On: 02/21/2024 00:15     .[name], M.D., F.A.C.S. [facility] [address] [place], [place] [address] [phone] Main / Paging  02/22/2024 7:01 AM    Elspeth JAYSON Schultze

## 2024-02-22 NOTE — Progress Notes (Signed)

## 2024-02-23 ENCOUNTER — Encounter (HOSPITAL_COMMUNITY): Payer: Self-pay | Admitting: Surgery

## 2024-02-23 LAB — CBC
HCT: 27.9 % — ABNORMAL LOW (ref 36.0–46.0)
Hemoglobin: 9.3 g/dL — ABNORMAL LOW (ref 12.0–15.0)
MCH: 31.1 pg (ref 26.0–34.0)
MCHC: 33.3 g/dL (ref 30.0–36.0)
MCV: 93.3 fL (ref 80.0–100.0)
Platelets: 420 10*3/uL — ABNORMAL HIGH (ref 150–400)
RBC: 2.99 MIL/uL — ABNORMAL LOW (ref 3.87–5.11)
RDW: 11.8 % (ref 11.5–15.5)
WBC: 15.3 10*3/uL — ABNORMAL HIGH (ref 4.0–10.5)
nRBC: 0 % (ref 0.0–0.2)

## 2024-02-23 MED ORDER — FERROUS SULFATE 325 (65 FE) MG PO TABS: 325.0000 mg | ORAL_TABLET | Freq: Every day | ORAL | Status: DC

## 2024-02-23 MED ORDER — CALCIUM POLYCARBOPHIL 625 MG PO TABS
625.0000 mg | ORAL_TABLET | Freq: Two times a day (BID) | ORAL | Status: DC
  Administered 2024-02-23 – 2024-02-25 (×5): 625 mg via ORAL
  Filled 2024-02-23 (×6): qty 1

## 2024-02-23 MED ORDER — OXYCODONE HCL 5 MG PO TABS
5.0000 mg | ORAL_TABLET | ORAL | Status: DC | PRN
  Administered 2024-02-24 – 2024-02-25 (×3): 5 mg via ORAL
  Filled 2024-02-23 (×3): qty 1

## 2024-02-23 MED ORDER — DOCUSATE SODIUM 100 MG PO CAPS
100.0000 mg | ORAL_CAPSULE | Freq: Every day | ORAL | Status: DC
  Administered 2024-02-23 – 2024-02-25 (×3): 100 mg via ORAL
  Filled 2024-02-23 (×3): qty 1

## 2024-02-23 MED ORDER — FERROUS SULFATE 325 (65 FE) MG PO TABS
325.0000 mg | ORAL_TABLET | Freq: Three times a day (TID) | ORAL | Status: DC
  Administered 2024-02-23 – 2024-02-25 (×6): 325 mg via ORAL
  Filled 2024-02-23 (×6): qty 1

## 2024-02-23 MED ORDER — NAPROXEN 250 MG PO TABS
500.0000 mg | ORAL_TABLET | Freq: Two times a day (BID) | ORAL | Status: DC
  Administered 2024-02-23 – 2024-02-25 (×4): 500 mg via ORAL
  Filled 2024-02-23 (×4): qty 2

## 2024-02-23 MED ORDER — GABAPENTIN 300 MG PO CAPS
300.0000 mg | ORAL_CAPSULE | Freq: Every day | ORAL | Status: DC
  Administered 2024-02-23 – 2024-02-24 (×2): 300 mg via ORAL
  Filled 2024-02-23 (×2): qty 1

## 2024-02-23 NOTE — Progress Notes (Signed)
 02/23/2024  [name] Bass [id number] [birth year]  CARE TEAM: PCP: Patient, No Pcp Per  Outpatient Care Team: Patient Care Team: Patient, No Pcp Per as PCP - General (General Practice)  Inpatient Treatment Team: Treatment Team:  Willette Adriana LABOR, MD Ccs, Md, MD Bobbette Payor, MD Saralee Yancy RAMAN, NT Isidor Anette SQUIBB, RN Koleen Devere SAUNDERS, NT   Problem List:   Principal Problem:   Abscess of left buttock Active Problems:   Postoperative complication   02/22/2024  POST-OPERATIVE DIAGNOSIS:   Deep Abscess of right buttock History of Sudan Butt Lift (BBL)   PROCEDURE: INCISION AND DRAINAGE DEEP BUTTOCK ABSCESS   SURGEON:  [name], MD    Assessment [name] Stay = 1 days) 1 Day Post-Op    Stabilized    Plan:  Continue IV antibiotics.  Given gram-positive negative rods continue cefazolin /metronidazole .  Follow white count.  Follow-up on cultures and sensitivity.  Try get her out of bed.  She needs dressing changes.  Discussed with floor nurse who has wound care training.  [place] to base.  Pack once a day and change outer dressing as needed.  Hopefully can discharge in a few days once tolerating packing and get cultures and sensitivities to plan for outpatient antibiotics.  If marginally worsens, may be operative for exploration of the wound.  Hopefully not too likely.  -monitor electrolytes & replace as needed  Keep K>4, Mg>2, Phos>3  -VTE prophylaxis- SCDs.  Anticoagulation prophyllaxis SQ as appropriate  -mobilize as tolerated to help recovery.  Enlist therapies in moderate/high risk patients as appropriate  I updated the patient's status to the patient and significant other & nurse.   Recommendations were made.  Questions were answered.  They expressed understanding & appreciation.  -Disposition:  Disposition:  The patient is from: Home Anticipate discharge to:  Home with Home Health Anticipated Date of Discharge is:  July 7,2025    Barriers to discharge:  Pending Clinical improvement (more likely than not)  Patient currently is NOT MEDICALLY STABLE for discharge from the hospital from a surgery standpoint.      I reviewed nursing notes, last 24 h vitals and pain scores, last 48 h intake and output, last 24 h labs and trends, and last 24 h imaging results.  I have reviewed this patient's available data, including medical history, events of note, test results, etc as part of my evaluation.   A significant portion of that time was spent in counseling. Care during the described time interval was provided by me.  This care required straight-forward level of medical decision making.  02/23/2024    Subjective: (Chief complaint)  Patient feeling better.  Still with significant pain that she prefers to lie on her belly and not sit.  Significant other in room.  Per nursing just outside room.  Rounded with me.  Objective:  Vital signs:  Vitals:   02/22/24 1809 02/22/24 2227 02/23/24 0223 02/23/24 0606  BP: 118/73 123/73 128/78 132/79  Pulse: 83 91 84 87  Resp: 16 18 16 18   Temp: 98.4 F (36.9 C) 98.4 F (36.9 C) 98.4 F (36.9 C) 98.1 F (36.7 C)  TempSrc: Oral Oral Oral Oral  SpO2: 98% 97% 99% 98%  Weight:      Height:        Last BM Date : 02/20/24  Intake/Output   Yesterday:  07/04 0701 - 07/05 0700 In: 2294.7 [P.O.:100; I.V.:1294.7; IV Piggyback:900] Out: 5 [Blood:5] This shift:  No intake/output data recorded.  Bowel  function:  Flatus: YES  BM:  No  Drain: (No drain)   Physical Exam:  General: Pt awake/alert in no acute distress Eyes: PERRL, normal EOM.  Sclera clear.  No icterus Neuro: CN II-XII intact w/o focal sensory/motor deficits. Lymph: No head/neck/groin lymphadenopathy Psych:  No delerium/psychosis/paranoia.  Oriented x 4 HENT: Normocephalic, Mucus membranes moist.  No thrush Neck: Supple, No tracheal deviation.  No obvious thyromegaly Chest: No pain to chest wall  compression.  Good respiratory excursion.  No audible wheezing CV:  Pulses intact.  Regular rhythm.  No major extremity edema MS: Normal AROM mjr joints.  No obvious deformity  Right buttock cellulitis erythema gone down with much less pain and guarding.  Old blood on dressing.  Right upper outer gluteal transverse incision 4 x 2 cm.   Ext:   No deformity.  No mjr edema.  No cyanosis Skin: No petechiae / purpurea.  No major sores.  Warm and dry    Results:   Cultures: Recent Results (from the past 720 hours)  Culture, blood (routine x 2)     Status: None (Preliminary result)   Collection Time: 02/20/24  9:09 PM   Specimen: BLOOD RIGHT FOREARM  Result Value Ref Range Status   Specimen Description   Final    BLOOD RIGHT FOREARM Performed at [facility], [address], [place], [place] [address]    Special Requests   Final    BOTTLES DRAWN AEROBIC AND ANAEROBIC Blood Culture results may not be optimal due to an inadequate volume of blood received in culture bottles Performed at [facility], [address] [place], [place] [address]    Culture   Final    NO GROWTH 2 DAYS Performed at [facility], [address], [place], [place] [address]    Report Status PENDING  Incomplete  Blood Culture (routine x 2)     Status: None (Preliminary result)   Collection Time: 02/21/24 10:50 PM   Specimen: BLOOD RIGHT FOREARM  Result Value Ref Range Status   Specimen Description   Final    BLOOD RIGHT FOREARM Performed at [facility], [address] [place], [place] [address]    Special Requests   Final    BOTTLES DRAWN AEROBIC ONLY Blood Culture adequate volume Performed at [facility], [address] [place], [place] [address]    Culture   Final    NO GROWTH < 12 HOURS Performed at [facility], [address] [place], [place] [address]    Report Status PENDING  Incomplete  Blood Culture (routine x 2)     Status:  None (Preliminary result)   Collection Time: 02/22/24 12:20 AM   Specimen: BLOOD RIGHT ARM  Result Value Ref Range Status   Specimen Description   Final    BLOOD RIGHT ARM Performed at [facility], [address] [place], [place] [address]    Special Requests   Final    BOTTLES DRAWN AEROBIC AND ANAEROBIC Blood Culture results may not be optimal due to an inadequate volume of blood received in culture bottles Performed at [facility], [address], [place], [place] [address]    Culture   Final    NO GROWTH < 12 HOURS Performed at [facility], [address] [place], [place] [address]    Report Status PENDING  Incomplete  Resp panel by RT-PCR (RSV, Flu A&B, Covid) Anterior  Nasal Swab     Status: None   Collection Time: 02/22/24 12:30 AM   Specimen: Anterior Nasal Swab  Result Value Ref Range Status   SARS Coronavirus 2 by RT PCR NEGATIVE NEGATIVE Final    Comment: (NOTE) SARS-CoV-2 target nucleic acids are NOT DETECTED.  The SARS-CoV-2 RNA is generally detectable in upper respiratory specimens during the acute phase of infection. The lowest concentration of SARS-CoV-2 viral copies this assay can detect is 138 copies/mL. A negative result does not preclude SARS-Cov-2 infection and should not be used as the sole basis for treatment or other patient management decisions. A negative result may occur with  improper specimen collection/handling, submission of specimen other than nasopharyngeal swab, presence of viral mutation(s) within the areas targeted by this assay, and inadequate number of viral copies(<138 copies/mL). A negative result must be combined with clinical observations, patient history, and epidemiological information. The expected result is Negative.  Fact Sheet for Patients:  BloggerCourse.com  Fact Sheet for Healthcare Providers:  SeriousBroker.it  This test is no t yet  approved or cleared by the [place]  FDA and  has been authorized for detection and/or diagnosis of SARS-CoV-2 by FDA under an Emergency Use Authorization (EUA). This EUA will remain  in effect (meaning this test can be used) for the duration of the COVID-19 declaration under Section 564(b)(1) of the Act, 21 U.S.C.section 360bbb-3(b)(1), unless the authorization is terminated  or revoked sooner.       Influenza A by PCR NEGATIVE NEGATIVE Final   Influenza B by PCR NEGATIVE NEGATIVE Final    Comment: (NOTE) The Xpert Xpress SARS-CoV-2/FLU/RSV plus assay is intended as an aid in the diagnosis of influenza from Nasopharyngeal swab specimens and should not be used as a sole basis for treatment. Nasal washings and aspirates are unacceptable for Xpert Xpress SARS-CoV-2/FLU/RSV testing.  Fact Sheet for Patients: BloggerCourse.com  Fact Sheet for Healthcare Providers: SeriousBroker.it  This test is not yet approved or cleared by the [place]  FDA and has been authorized for detection and/or diagnosis of SARS-CoV-2 by FDA under an Emergency Use Authorization (EUA). This EUA will remain in effect (meaning this test can be used) for the duration of the COVID-19 declaration under Section 564(b)(1) of the Act, 21 U.S.C. section 360bbb-3(b)(1), unless the authorization is terminated or revoked.     Resp Syncytial Virus by PCR NEGATIVE NEGATIVE Final    Comment: (NOTE) Fact Sheet for Patients: BloggerCourse.com  Fact Sheet for Healthcare Providers: SeriousBroker.it  This test is not yet approved or cleared by the [place]  FDA and has been authorized for detection and/or diagnosis of SARS-CoV-2 by FDA under an Emergency Use Authorization (EUA). This EUA will remain in effect (meaning this test can be used) for the duration of the COVID-19 declaration under Section 564(b)(1) of  the Act, 21 U.S.C. section 360bbb-3(b)(1), unless the authorization is terminated or revoked.  Performed at [facility], [address], [place], [place] [address]   MRSA Next Gen by PCR, Nasal     Status: None   Collection Time: 02/22/24 12:30 AM   Specimen: Anterior Nasal Swab  Result Value Ref Range Status   MRSA by PCR Next Gen NOT DETECTED NOT DETECTED Final    Comment: (NOTE) The GeneXpert MRSA Assay (FDA approved for NASAL specimens only), is one component of a comprehensive MRSA colonization surveillance program. It is not intended to diagnose MRSA infection nor to guide or monitor treatment for MRSA infections. Test performance is not  FDA approved in patients less than 56 years old. Performed at [facility], [address] [place], [place] [address]   Aerobic/Anaerobic Culture w Gram Stain (surgical/deep wound)     Status: None (Preliminary result)   Collection Time: 02/22/24  8:32 AM   Specimen: Abscess  Result Value Ref Range Status   Specimen Description   Final    ABSCESS RIGHT BUTTOCKS Performed at [facility], [address], [place], [place] [address]    Special Requests   Final    NONE Performed at [facility], [address], [place], [place] [address]    Gram Stain   Final    ABUNDANT WBC PRESENT, PREDOMINANTLY PMN MODERATE GRAM POSITIVE COCCI RARE GRAM NEGATIVE RODS Performed at [facility], [address], [place], [place] [address]    Culture PENDING  Incomplete   Report Status PENDING  Incomplete    Labs: Results for orders placed or performed during the hospital encounter of 02/21/24 (from the past 48 hours)  Blood Culture (routine x 2)     Status: None (Preliminary result)   Collection Time: 02/21/24 10:50 PM   Specimen: BLOOD RIGHT FOREARM  Result Value Ref Range   Specimen Description      BLOOD RIGHT FOREARM Performed at [facility], [address],  [place], [place] [address]    Special Requests      BOTTLES DRAWN AEROBIC ONLY Blood Culture adequate volume Performed at [facility], [address] [place], [place] [address]    Culture      NO GROWTH < 12 HOURS Performed at [facility], [address] [place], [place] [address]    Report Status PENDING   I-Stat Lactic Acid, ED     Status: None   Collection Time: 02/21/24 11:07 PM  Result Value Ref Range   Lactic Acid, Venous 0.8 0.5 - 1.9 mmol/L  Comprehensive metabolic panel     Status: Abnormal   Collection Time: 02/21/24 11:08 PM  Result Value Ref Range   Sodium 135 135 - 145 mmol/L   Potassium 3.6 3.5 - 5.1 mmol/L   Chloride 105 98 - 111 mmol/L   CO2 21 (L) 22 - 32 mmol/L   Glucose, Bld 119 (H) 70 - 99 mg/dL    Comment: Glucose reference range applies only to samples taken after fasting for at least 8 hours.   BUN 10 6 - 20 mg/dL   Creatinine, Ser 9.45 0.44 - 1.00 mg/dL   Calcium  8.6 (L) 8.9 - 10.3 mg/dL   Total Protein 6.3 (L) 6.5 - 8.1 g/dL   Albumin 3.1 (L) 3.5 - 5.0 g/dL   AST 16 15 - 41 U/L   ALT 20 0 - 44 U/L   Alkaline Phosphatase 75 38 - 126 U/L   Total Bilirubin 0.7 0.0 - 1.2 mg/dL   GFR, Estimated >39 >39 mL/min    Comment: (NOTE) Calculated using the CKD-EPI Creatinine Equation (2021)    Anion gap 9 5 - 15    Comment: Performed at [facility], [address] [place], [place] [address]  CBC with Differential     Status: Abnormal   Collection Time: 02/21/24 11:08 PM  Result Value Ref Range   WBC 19.5 (H) 4.0 - 10.5 K/uL   RBC 3.17 (L) 3.87 - 5.11 MIL/uL   Hemoglobin 10.0 (L) 12.0 - 15.0 g/dL   HCT 70.4 (L) 63.9 -  46.0 %   MCV 93.1 80.0 - 100.0 fL   MCH 31.5 26.0 - 34.0 pg   MCHC 33.9 30.0 - 36.0 g/dL   RDW 88.0 88.4 - 84.4 %   Platelets 431 (H) 150 - 400 K/uL   nRBC 0.0 0.0 - 0.2 %   Neutrophils Relative % 87 %   Neutro Abs 16.8 (H) 1.7 - 7.7 K/uL   Lymphocytes Relative 5 %   Lymphs Abs 1.0 0.7 - 4.0  K/uL   Monocytes Relative 6 %   Monocytes Absolute 1.2 (H) 0.1 - 1.0 K/uL   Eosinophils Relative 1 %   Eosinophils Absolute 0.3 0.0 - 0.5 K/uL   Basophils Relative 1 %   Basophils Absolute 0.1 0.0 - 0.1 K/uL   Immature Granulocytes 0 %   Abs Immature Granulocytes 0.07 0.00 - 0.07 K/uL    Comment: Performed at [facility], [address] [place], [place] [address]  hCG, serum, qualitative     Status: None   Collection Time: 02/21/24 11:08 PM  Result Value Ref Range   Preg, Serum NEGATIVE NEGATIVE    Comment:        THE SENSITIVITY OF THIS METHODOLOGY IS >10 mIU/mL. Performed at [facility], [address] [place], [place] [address]   Blood Culture (routine x 2)     Status: None (Preliminary result)   Collection Time: 02/22/24 12:20 AM   Specimen: BLOOD RIGHT ARM  Result Value Ref Range   Specimen Description      BLOOD RIGHT ARM Performed at [facility], [address], [place], [place] [address]    Special Requests      BOTTLES DRAWN AEROBIC AND ANAEROBIC Blood Culture results may not be optimal due to an inadequate volume of blood received in culture bottles Performed at [facility], [address], [place], [place] [address]    Culture      NO GROWTH < 12 HOURS Performed at [facility], [address], [place], [place] [address]    Report Status PENDING   Protime-INR     Status: None   Collection Time: 02/22/24 12:20 AM  Result Value Ref Range   Prothrombin Time 14.8 11.4 - 15.2 seconds   INR 1.1 0.8 - 1.2    Comment: (NOTE) INR goal varies based on device and disease states. Performed at [facility], [address], [place], [place] [address]   HIV Antibody (routine testing w rflx)     Status: None   Collection Time: 02/22/24 12:20 AM  Result Value Ref Range   HIV Screen 4th Generation wRfx Non Reactive Non Reactive    Comment: Performed at [facility], [address] [place], [place] [address]  Resp panel by RT-PCR (RSV, Flu A&B, Covid) Anterior Nasal Swab     Status: None   Collection Time: 02/22/24 12:30 AM   Specimen: Anterior Nasal Swab  Result Value Ref Range   SARS Coronavirus 2 by RT PCR NEGATIVE NEGATIVE    Comment: (NOTE) SARS-CoV-2 target nucleic acids are NOT DETECTED.  The SARS-CoV-2 RNA is generally detectable in upper respiratory specimens during the acute phase of infection. The lowest concentration of SARS-CoV-2 viral copies this assay can detect is 138 copies/mL. A negative result does not preclude SARS-Cov-2 infection and should not be used as the sole basis for treatment or other patient management decisions. A negative result may occur with  improper  specimen collection/handling, submission of specimen other than nasopharyngeal swab, presence of viral mutation(s) within the areas targeted by this assay, and inadequate number of viral copies(<138 copies/mL). A negative result must be combined with clinical observations, patient history, and epidemiological information. The expected result is Negative.  Fact Sheet for Patients:  BloggerCourse.com  Fact Sheet for Healthcare Providers:  SeriousBroker.it  This test is no t yet approved or cleared by the [place]  FDA and  has been authorized for detection and/or diagnosis of SARS-CoV-2 by FDA under an Emergency Use Authorization (EUA). This EUA will remain  in effect (meaning this test can be used) for the duration of the COVID-19 declaration under Section 564(b)(1) of the Act, 21 U.S.C.section 360bbb-3(b)(1), unless the authorization is terminated  or revoked sooner.       Influenza A by PCR NEGATIVE NEGATIVE   Influenza B by PCR NEGATIVE NEGATIVE    Comment: (NOTE) The Xpert Xpress SARS-CoV-2/FLU/RSV plus assay is intended as an aid in the diagnosis of influenza from Nasopharyngeal swab specimens and should not  be used as a sole basis for treatment. Nasal washings and aspirates are unacceptable for Xpert Xpress SARS-CoV-2/FLU/RSV testing.  Fact Sheet for Patients: BloggerCourse.com  Fact Sheet for Healthcare Providers: SeriousBroker.it  This test is not yet approved or cleared by the [place]  FDA and has been authorized for detection and/or diagnosis of SARS-CoV-2 by FDA under an Emergency Use Authorization (EUA). This EUA will remain in effect (meaning this test can be used) for the duration of the COVID-19 declaration under Section 564(b)(1) of the Act, 21 U.S.C. section 360bbb-3(b)(1), unless the authorization is terminated or revoked.     Resp Syncytial Virus by PCR NEGATIVE NEGATIVE    Comment: (NOTE) Fact Sheet for Patients: BloggerCourse.com  Fact Sheet for Healthcare Providers: SeriousBroker.it  This test is not yet approved or cleared by the [place]  FDA and has been authorized for detection and/or diagnosis of SARS-CoV-2 by FDA under an Emergency Use Authorization (EUA). This EUA will remain in effect (meaning this test can be used) for the duration of the COVID-19 declaration under Section 564(b)(1) of the Act, 21 U.S.C. section 360bbb-3(b)(1), unless the authorization is terminated or revoked.  Performed at [facility], [address], [place], [place] [address]   MRSA Next Gen by PCR, Nasal     Status: None   Collection Time: 02/22/24 12:30 AM   Specimen: Anterior Nasal Swab  Result Value Ref Range   MRSA by PCR Next Gen NOT DETECTED NOT DETECTED    Comment: (NOTE) The GeneXpert MRSA Assay (FDA approved for NASAL specimens only), is one component of a comprehensive MRSA colonization surveillance program. It is not intended to diagnose MRSA infection nor to guide or monitor treatment for MRSA infections. Test performance is not FDA  approved in patients less than 24 years old. Performed at [facility], [address], [place], [place] [address]   Comprehensive metabolic panel     Status: Abnormal   Collection Time: 02/22/24  4:05 AM  Result Value Ref Range   Sodium 137 135 - 145 mmol/L   Potassium 3.6 3.5 - 5.1 mmol/L   Chloride 103 98 - 111 mmol/L   CO2 24 22 - 32 mmol/L   Glucose, Bld 107 (H) 70 - 99 mg/dL    Comment: Glucose reference range applies only to samples taken after fasting for at least 8 hours.   BUN 8 6 - 20 mg/dL   Creatinine,  Ser 0.63 0.44 - 1.00 mg/dL   Calcium  8.4 (L) 8.9 - 10.3 mg/dL   Total Protein 5.6 (L) 6.5 - 8.1 g/dL   Albumin 2.7 (L) 3.5 - 5.0 g/dL   AST 45 (H) 15 - 41 U/L   ALT 36 0 - 44 U/L   Alkaline Phosphatase 90 38 - 126 U/L   Total Bilirubin 0.8 0.0 - 1.2 mg/dL   GFR, Estimated >39 >39 mL/min    Comment: (NOTE) Calculated using the CKD-EPI Creatinine Equation (2021)    Anion gap 10 5 - 15    Comment: Performed at [facility], [address] [place], [place] [address]  CBC     Status: Abnormal   Collection Time: 02/22/24  4:05 AM  Result Value Ref Range   WBC 17.7 (H) 4.0 - 10.5 K/uL   RBC 2.79 (L) 3.87 - 5.11 MIL/uL   Hemoglobin 8.7 (L) 12.0 - 15.0 g/dL   HCT 73.5 (L) 63.9 - 53.9 %   MCV 94.6 80.0 - 100.0 fL   MCH 31.2 26.0 - 34.0 pg   MCHC 33.0 30.0 - 36.0 g/dL   RDW 88.0 88.4 - 84.4 %   Platelets 375 150 - 400 K/uL   nRBC 0.0 0.0 - 0.2 %    Comment: Performed at [facility], [address] [place], [place] [address]  APTT     Status: None   Collection Time: 02/22/24  4:05 AM  Result Value Ref Range   aPTT 36 24 - 36 seconds    Comment: Performed at [facility], [address], [place], [place] [address]  Protime-INR     Status: Abnormal   Collection Time: 02/22/24  4:05 AM  Result Value Ref Range   Prothrombin Time 15.3 (H) 11.4 - 15.2 seconds   INR 1.1 0.8 - 1.2    Comment:  (NOTE) INR goal varies based on device and disease states. Performed at [facility], [address], [place], [place] [address]   Aerobic/Anaerobic Culture w Gram Stain (surgical/deep wound)     Status: None (Preliminary result)   Collection Time: 02/22/24  8:32 AM   Specimen: Abscess  Result Value Ref Range   Specimen Description      ABSCESS RIGHT BUTTOCKS Performed at [facility], [address] [place], [place] [address]    Special Requests      NONE Performed at [facility], [address], [place], [place] [address]    Gram Stain      ABUNDANT WBC PRESENT, PREDOMINANTLY PMN MODERATE GRAM POSITIVE COCCI RARE GRAM NEGATIVE RODS Performed at [facility], [address] [place], [place] [address]    Culture PENDING    Report Status PENDING   Iron and TIBC     Status: Abnormal   Collection Time: 02/22/24 12:36 PM  Result Value Ref Range   Iron 12 (L) 28 - 170 ug/dL   TIBC 737 749 - 549 ug/dL   Saturation Ratios 5 (L) 10.4 - 31.8 %   UIBC 250 ug/dL    Comment: Performed at [facility], [address] [place], [place] [address]  Vitamin B12     Status: None   Collection Time: 02/22/24 12:36 PM  Result Value Ref Range   Vitamin B-12 248 180 - 914 pg/mL    Comment: (NOTE) This assay is not validated for testing neonatal or myeloproliferative syndrome specimens for Vitamin B12  levels. Performed at [facility], [address], [place], [place] [address]   Folate     Status: None   Collection Time: 02/22/24 12:36 PM  Result Value Ref Range   Folate 18.6 >5.9 ng/mL    Comment: Performed at [facility], [address], [place], [place] [address]  CBC     Status: Abnormal   Collection Time: 02/23/24  2:35 AM  Result Value Ref Range   WBC 15.3 (H) 4.0 - 10.5 K/uL   RBC 2.99 (L) 3.87 - 5.11 MIL/uL   Hemoglobin 9.3 (L) 12.0 - 15.0 g/dL   HCT 72.0 (L) 63.9 -  46.0 %   MCV 93.3 80.0 - 100.0 fL   MCH 31.1 26.0 - 34.0 pg   MCHC 33.3 30.0 - 36.0 g/dL   RDW 88.1 88.4 - 84.4 %   Platelets 420 (H) 150 - 400 K/uL   nRBC 0.0 0.0 - 0.2 %    Comment: Performed at [facility], [address], [place], [place] [address]    Imaging / Studies: DG Chest Port 1 View Result Date: 02/21/2024 CLINICAL DATA:  Recent BBL, abscess on buttocks,fever and ? Of sepsis now, patient is prone due to pain Questionable sepsis - evaluate for abnormality EXAM: PORTABLE CHEST 1 VIEW COMPARISON:  Chest x-ray 11/09/2008 FINDINGS: Limited evaluation due to overlapping osseous structures and overlying soft tissues. The heart and mediastinal contours are within normal limits. No focal consolidation. No pulmonary edema. No pleural effusion. No pneumothorax. No acute osseous abnormality. IMPRESSION: No active disease. Limited evaluation due to overlapping osseous structures and overlying soft tissues. Consider repeat chest x-ray PA and lateral view for further evaluation. Electronically Signed   By: Morgane  Naveau M.D.   On: 02/21/2024 23:16    Medications / Allergies: per chart  Antibiotics: Anti-infectives (From admission, onward)    Start     Dose/Rate Route Frequency Ordered Stop   02/22/24 0600  ceFAZolin  (ANCEF ) IVPB 2g/100 mL premix        2 g 200 mL/hr over 30 Minutes Intravenous Every 8 hours 02/21/24 2323     02/22/24 0600  ceFAZolin  (ANCEF ) IVPB 2g/100 mL premix  Status:  Discontinued        2 g 200 mL/hr over 30 Minutes Intravenous On call to O.R. 02/21/24 2326 02/22/24 0932   02/21/24 2359  metroNIDAZOLE  (FLAGYL ) IVPB 500 mg        500 mg 100 mL/hr over 60 Minutes Intravenous Every 12 hours 02/21/24 2323     02/21/24 2245  ceFEPIme  (MAXIPIME ) 2 g in sodium chloride  0.9 % 100 mL IVPB  Status:  Discontinued        2 g 200 mL/hr over 30 Minutes Intravenous  Once 02/21/24 2242 02/21/24 2340   02/21/24 2245  vancomycin  (VANCOCIN ) IVPB 1000 mg/200 mL  premix        1,000 mg 200 mL/hr over 60 Minutes Intravenous  Once 02/21/24 2242 02/22/24 0238         Note: Portions of this report may have been transcribed using voice recognition software. Every effort was made to ensure accuracy; however, inadvertent computerized transcription errors may be present.   Any transcriptional errors that result from this process are unintentional.    [name], MD, FACS, MASCRS Esophageal, Gastrointestinal & Colorectal Surgery Robotic and Minimally Invasive Surgery  [facility] [address] [place], [place] [address] [phone]  Fax [phone] Main  CONTACT INFORMATION: Weekday (9AM-5PM): Call [facility] main office at [phone] Weeknight (5PM-9AM) or Weekend/Holiday: Check EPIC Web Links tab & use AMION (password  [email or web address]) for General Surgery [facility] coverage  Please, DO NOT use SecureChat  (it is not reliable communication to reach operating surgeons & will lead to a delay in care).   Epic staff messaging available for outptient concerns needing 1-2 business day response.      02/23/2024  7:23 AM

## 2024-02-23 NOTE — Progress Notes (Signed)
 PROGRESS NOTE    Patient: [name] Bass                            PCP: Patient, No Pcp Per                    DOB: 02-26-1986            DOA: 02/21/2024 FMW:[id number]             DOS: 02/23/2024, 10:51 AM   LOS: 1 day   Date of Service: The patient was seen and examined on 02/23/2024  Subjective:   The patient was seen and examined this morning, complaining of right buttocks tenderness, pain otherwise he is stable, Improved leukocytosis  S/P 02/22/2024 right buttocks I&D by general surgery Dr. Sheldon  Brief Narrative:   [name] Bass is a 38 y.o. female with medical history significant of Brazilian butt lift procedure postop day 16 presented to emergency for the second time to bed complaining of worsening fever, persistent pain. Patient had recent [place] butt lift procedure done by Dr. Aisha at outpatient surgical center in [facility] 02/05/2024.  5 days ago patient had a pain started on the right buttock with associated fever.   She was seen by her surgeon reported aspirated the area of the right buttock and started on Bactrim  however presented to ED for the second time today for persistent pain.  In the ED 02/21/2024 patient found to have suspected cellulitis and questionable abscess of the right buttock confirmed on the CT imaging subsequently underwent aspiration around 10 cc purulent discharge and patient was sent home to follow-up with surgeon in the office 7/5. 7/3 ED today- revealed concern for abscess formation. ED physician initially spoke with patient's or general surgeon Dr. Aisha who stated that he does not have any hospital privilege to do any surgical procedure and recommended to speak with our inpatient surgeon for further evaluation.    Dr. Garnette Sheldon has been consulted..  Per general surgery no suspicion for Fournier's gangrene/necrotic fasciitis at this time.  Recommended IV antibiotic coverage and plan to take her OR in the morning 7/4- plan for I&D.     At  presentation to ED patient is tachycardic otherwise hemodynamically stable. CBC showing leukocytosis 19.5, stable H&H normal platelet count. CMP showing low bicarb 21, low albumin 3.1  Lactic acid within normal range. Pregnancy test negative. EKG shows sinus tachycardia heart rate 128. Blood cultures are in process.  CT abdomen pelvis from 7/2 showed 5.2 cm x 6.0 cm x 7.3 cm abscess within the subcutaneous fat along the posterolateral pelvic wall on the right with additional areas of adjacent cellulitis and subcutaneous emphysema. 2. Bilateral nonobstructing renal calculi. 3. Evidence of prior cholecystectomy.   Was given IV vancomycin , cefepime -General Surgery ordered IV cefazolin  and metronidazole  for surgical prophylaxis and 1 L of LR bolus followed by continue LR 150 cc/h.     Assessment & Plan:   Principal Problem:   Abscess of left buttock   Anemia       Assessment and Plan:  Postop complication-posterolateral right pelvic wall abscess -S/p I&D postop day 1 - Hemodynamically stable, improved leukocytosis WBC 19.5 >> 17.7 >> 15.3  -Patient recently 02/05/2024 has [place] butt lift procedure which has been complicated with abscess formation.   -Primary plastic surgeon Dr. Aisha  - Consulted general surgeon Dr. Garnette Sheldon - 02/22/2024-s/p right Botox abscess I&D -following up with cultures  -  CT abdomen pelvis showed 5.2 cm x 6.0 cm x 7.3 cm abscess within the subcutaneous fat along the posterolateral pelvic wall on the right with additional areas of adjacent cellulitis and subcutaneous emphysema.  -Continue current antibiotics of IV cefazolin , Flagyl  - Continue IVF -discontinued tolerating clear  - Wound cultures are growing gram-positive cocci    Acute on chronic anemia-severe iron deficiency -Monitor H&H closely Iron/TIBC/Ferritin/ %Sat    Component Value Date/Time   IRON 12 (L) 02/22/2024 1236   TIBC 262 02/22/2024 1236   IRONPCTSAT 5 (L) 02/22/2024  1236   -Initiating oral iron supplements -Will transfuse if hemoglobin drops below 7.0 or patient becomes symptomatic or hypotensive -Checking baseline iron levels    ----------------------------------------------------------------------------------------------------------------------------------------------- Nutritional status:  The patient's BMI is: Body mass index is 26.04 kg/m. I agree with the assessment and plan as outlined    ---------------------------------------------------------------------------------------------------------------------------------------------------- Cultures; Intraoperative wound cultures >> Gram-positive rods Blood cultures >> no growth to date  02/21/2024 -IV antibiotics cefazolin /metronidazole    ------------------------------------------------------------------------------------------------------------------------------------------------  DVT prophylaxis:  heparin  injection 5,000 Units Start: 02/23/24 0600 SCDs Start: 02/22/24 0018 Place TED hose Start: 02/22/24 0018 SCD's Start: 02/21/24 2325   Code Status:   Code Status: Full Code  Family Communication: Boyfriend present at bedside -Advance care planning has been discussed.   Admission status:   Status is: Observation The patient remains OBS appropriate and will d/c before 2 midnights.   Disposition: From  - home             Planning for discharge in 1-2 days: to   Procedures:   No admission procedures for hospital encounter.   Antimicrobials:  Anti-infectives (From admission, onward)    Start     Dose/Rate Route Frequency Ordered Stop   02/22/24 0600  ceFAZolin  (ANCEF ) IVPB 2g/100 mL premix        2 g 200 mL/hr over 30 Minutes Intravenous Every 8 hours 02/21/24 2323     02/22/24 0600  ceFAZolin  (ANCEF ) IVPB 2g/100 mL premix  Status:  Discontinued        2 g 200 mL/hr over 30 Minutes Intravenous On call to O.R. 02/21/24 2326 02/22/24 0932   02/21/24 2359  metroNIDAZOLE   (FLAGYL ) IVPB 500 mg        500 mg 100 mL/hr over 60 Minutes Intravenous Every 12 hours 02/21/24 2323     02/21/24 2245  ceFEPIme  (MAXIPIME ) 2 g in sodium chloride  0.9 % 100 mL IVPB  Status:  Discontinued        2 g 200 mL/hr over 30 Minutes Intravenous  Once 02/21/24 2242 02/21/24 2340   02/21/24 2245  vancomycin  (VANCOCIN ) IVPB 1000 mg/200 mL premix        1,000 mg 200 mL/hr over 60 Minutes Intravenous  Once 02/21/24 2242 02/22/24 0238        Medication:   acetaminophen   1,000 mg Oral Q6H   Chlorhexidine  Gluconate Cloth  6 each Topical Once   gabapentin   300 mg Oral QHS   heparin   5,000 Units Subcutaneous Q8H   naproxen   500 mg Oral BID WC   polycarbophil  625 mg Oral BID   sodium chloride  flush  10-40 mL Intracatheter Q12H   sodium chloride  flush  3 mL Intravenous Q12H   sodium chloride  flush  3 mL Intravenous Q12H   sodium chloride  flush  3 mL Intravenous Q12H    sodium chloride , alum & mag hydroxide-simeth, bisacodyl , dextrose , diphenhydrAMINE , HYDROmorphone  (DILAUDID ) injection, lactated ringers , lactated ringers , LORazepam , magic mouthwash, menthol -cetylpyridinium, methocarbamol  (  ROBAXIN ) injection, methocarbamol , metoprolol  tartrate, naphazoline-glycerin , ondansetron  (ZOFRAN ) IV, oxyCODONE , phenol, polyethylene glycol, simethicone , sodium chloride , sodium chloride  flush, sodium chloride  flush   Objective:   Vitals:   02/22/24 1809 02/22/24 2227 02/23/24 0223 02/23/24 0606  BP: 118/73 123/73 128/78 132/79  Pulse: 83 91 84 87  Resp: 16 18 16 18   Temp: 98.4 F (36.9 C) 98.4 F (36.9 C) 98.4 F (36.9 C) 98.1 F (36.7 C)  TempSrc: Oral Oral Oral Oral  SpO2: 98% 97% 99% 98%  Weight:      Height:        Intake/Output Summary (Last 24 hours) at 02/23/2024 1051 Last data filed at 02/23/2024 0602 Gross per 24 hour  Intake 1864.7 ml  Output --  Net 1864.7 ml   Filed Weights   02/21/24 2112 02/22/24 0100  Weight: 79.4 kg 82.3 kg     Physical examination:      General:  AAO x 3,  cooperative, no distress;   HEENT:  Normocephalic, PERRL, otherwise with in Normal limits   Neuro:  CNII-XII intact. , normal motor and sensation, reflexes intact   Lungs:   Clear to auscultation BL, Respirations unlabored,  No wheezes / crackles  Cardio:    S1/S2, RRR, No murmure, No Rubs or Gallops   Abdomen:  Soft, non-tender, bowel sounds active all four quadrants, no guarding or peritoneal signs.  Muscular  skeletal:  Limited exam -global generalized weaknesses - in bed, able to move all 4 extremities,   2+ pulses,  symmetric, No pitting edema  Skin:  Dry, warm to touch, right buttock surgical wound, packing in place  Wounds: Right buttocks wound, s/p I&D, packing in place, draining minimal          ------------------------------------------------------------------------------------------------------------------------------------------    LABs:     Latest Ref Rng & Units 02/23/2024    2:35 AM 02/22/2024    4:05 AM 02/21/2024   11:08 PM  CBC  WBC 4.0 - 10.5 K/uL 15.3  17.7  19.5   Hemoglobin 12.0 - 15.0 g/dL 9.3  8.7  89.9   Hematocrit 36.0 - 46.0 % 27.9  26.4  29.5   Platelets 150 - 400 K/uL 420  375  431       Latest Ref Rng & Units 02/22/2024    4:05 AM 02/21/2024   11:08 PM 02/20/2024    9:09 PM  CMP  Glucose 70 - 99 mg/dL 892  880  873   BUN 6 - 20 mg/dL 8  10  10    Creatinine 0.44 - 1.00 mg/dL 9.36  9.45  9.38   Sodium 135 - 145 mmol/L 137  135  133   Potassium 3.5 - 5.1 mmol/L 3.6  3.6  3.5   Chloride 98 - 111 mmol/L 103  105  98   CO2 22 - 32 mmol/L 24  21  23    Calcium  8.9 - 10.3 mg/dL 8.4  8.6  8.8   Total Protein 6.5 - 8.1 g/dL 5.6  6.3  6.8   Total Bilirubin 0.0 - 1.2 mg/dL 0.8  0.7  0.6   Alkaline Phos 38 - 126 U/L 90  75  62   AST 15 - 41 U/L 45  16  17   ALT 0 - 44 U/L 36  20  16        Micro Results Recent Results (from the past 240 hours)  Culture, blood (routine x 2)     Status: None (Preliminary result)   Collection  Time:  02/20/24  9:09 PM   Specimen: BLOOD RIGHT FOREARM  Result Value Ref Range Status   Specimen Description   Final    BLOOD RIGHT FOREARM Performed at [facility], [address] [place], [place] [address]    Special Requests   Final    BOTTLES DRAWN AEROBIC AND ANAEROBIC Blood Culture results may not be optimal due to an inadequate volume of blood received in culture bottles Performed at [facility], [address] [place], [place] [address]    Culture   Final    NO GROWTH 3 DAYS Performed at [facility], [address] [place], [place] [address]    Report Status PENDING  Incomplete  Blood Culture (routine x 2)     Status: None (Preliminary result)   Collection Time: 02/21/24 10:50 PM   Specimen: BLOOD RIGHT FOREARM  Result Value Ref Range Status   Specimen Description   Final    BLOOD RIGHT FOREARM Performed at [facility], [address] [place], [place] [address]    Special Requests   Final    BOTTLES DRAWN AEROBIC ONLY Blood Culture adequate volume Performed at [facility], [address], [place], [place] [address]    Culture   Final    NO GROWTH 1 DAY Performed at [facility], [address], [place], [place] [address]    Report Status PENDING  Incomplete  Blood Culture (routine x 2)     Status: None (Preliminary result)   Collection Time: 02/22/24 12:20 AM   Specimen: BLOOD RIGHT ARM  Result Value Ref Range Status   Specimen Description   Final    BLOOD RIGHT ARM Performed at [facility], [address], [place], [place] [address]    Special Requests   Final    BOTTLES DRAWN AEROBIC AND ANAEROBIC Blood Culture results may not be optimal due to an inadequate volume of blood received in culture bottles Performed at [facility], [address] [place], [place] [address]    Culture   Final    NO GROWTH 1 DAY Performed at [facility], [address]  [place], [place] [address]    Report Status PENDING  Incomplete  Resp panel by RT-PCR (RSV, Flu A&B, Covid) Anterior Nasal Swab     Status: None   Collection Time: 02/22/24 12:30 AM   Specimen: Anterior Nasal Swab  Result Value Ref Range Status   SARS Coronavirus 2 by RT PCR NEGATIVE NEGATIVE Final    Comment: (NOTE) SARS-CoV-2 target nucleic acids are NOT DETECTED.  The SARS-CoV-2 RNA is generally detectable in upper respiratory specimens during the acute phase of infection. The lowest concentration of SARS-CoV-2 viral copies this assay can detect is 138 copies/mL. A negative result does not preclude SARS-Cov-2 infection and should not be used as the sole basis for treatment or other patient management decisions. A negative result may occur with  improper specimen collection/handling, submission of specimen other than nasopharyngeal swab, presence of viral mutation(s) within the areas targeted by this assay, and inadequate number of viral copies(<138 copies/mL). A negative result must be combined with clinical observations, patient history, and epidemiological information. The expected result is Negative.  Fact Sheet for Patients:  BloggerCourse.com  Fact Sheet for Healthcare Providers:  SeriousBroker.it  This test is no t yet approved or cleared by the [place]  FDA and  has been authorized for detection  and/or diagnosis of SARS-CoV-2 by FDA under an Emergency Use Authorization (EUA). This EUA will remain  in effect (meaning this test can be used) for the duration of the COVID-19 declaration under Section 564(b)(1) of the Act, 21 U.S.C.section 360bbb-3(b)(1), unless the authorization is terminated  or revoked sooner.       Influenza A by PCR NEGATIVE NEGATIVE Final   Influenza B by PCR NEGATIVE NEGATIVE Final    Comment: (NOTE) The Xpert Xpress SARS-CoV-2/FLU/RSV plus assay is intended as an aid in the diagnosis of influenza  from Nasopharyngeal swab specimens and should not be used as a sole basis for treatment. Nasal washings and aspirates are unacceptable for Xpert Xpress SARS-CoV-2/FLU/RSV testing.  Fact Sheet for Patients: BloggerCourse.com  Fact Sheet for Healthcare Providers: SeriousBroker.it  This test is not yet approved or cleared by the [place]  FDA and has been authorized for detection and/or diagnosis of SARS-CoV-2 by FDA under an Emergency Use Authorization (EUA). This EUA will remain in effect (meaning this test can be used) for the duration of the COVID-19 declaration under Section 564(b)(1) of the Act, 21 U.S.C. section 360bbb-3(b)(1), unless the authorization is terminated or revoked.     Resp Syncytial Virus by PCR NEGATIVE NEGATIVE Final    Comment: (NOTE) Fact Sheet for Patients: BloggerCourse.com  Fact Sheet for Healthcare Providers: SeriousBroker.it  This test is not yet approved or cleared by the [place]  FDA and has been authorized for detection and/or diagnosis of SARS-CoV-2 by FDA under an Emergency Use Authorization (EUA). This EUA will remain in effect (meaning this test can be used) for the duration of the COVID-19 declaration under Section 564(b)(1) of the Act, 21 U.S.C. section 360bbb-3(b)(1), unless the authorization is terminated or revoked.  Performed at [facility], [address], [place], [place] [address]   MRSA Next Gen by PCR, Nasal     Status: None   Collection Time: 02/22/24 12:30 AM   Specimen: Anterior Nasal Swab  Result Value Ref Range Status   MRSA by PCR Next Gen NOT DETECTED NOT DETECTED Final    Comment: (NOTE) The GeneXpert MRSA Assay (FDA approved for NASAL specimens only), is one component of a comprehensive MRSA colonization surveillance program. It is not intended to diagnose MRSA infection nor to guide or  monitor treatment for MRSA infections. Test performance is not FDA approved in patients less than 90 years old. Performed at [facility], [address] [place], [place] [address]   Aerobic/Anaerobic Culture w Gram Stain (surgical/deep wound)     Status: None (Preliminary result)   Collection Time: 02/22/24  8:32 AM   Specimen: Abscess  Result Value Ref Range Status   Specimen Description   Final    ABSCESS RIGHT BUTTOCKS Performed at [facility], [address] [place], [place] [address]    Special Requests   Final    NONE Performed at [facility], [address], [place], [place] [address]    Gram Stain   Final    ABUNDANT WBC PRESENT, PREDOMINANTLY PMN MODERATE GRAM POSITIVE COCCI RARE GRAM NEGATIVE RODS Performed at [facility], [address], [place], [place] [address]    Culture PENDING  Incomplete   Report Status PENDING  Incomplete    Radiology Reports No results found.   SIGNED: Adriana DELENA Grams, MD, FHM. FAAFP. Jolynn Pack - [facility] Time spent - 55 min.  In seeing, evaluating and examining the patient. Reviewing medical  records, labs, drawn plan of care. [facility],  Pager (please use [email or web address] to page/ text) Please use Epic Secure Chat for non-urgent communication (7AM-7PM)  If 7PM-7AM, please contact night-coverage [email or web address], 02/23/2024, 10:51 AM

## 2024-02-24 LAB — CBC
HCT: 34.1 % — ABNORMAL LOW (ref 36.0–46.0)
Hemoglobin: 11.5 g/dL — ABNORMAL LOW (ref 12.0–15.0)
MCH: 31.4 pg (ref 26.0–34.0)
MCHC: 33.7 g/dL (ref 30.0–36.0)
MCV: 93.2 fL (ref 80.0–100.0)
Platelets: 530 K/uL — ABNORMAL HIGH (ref 150–400)
RBC: 3.66 MIL/uL — ABNORMAL LOW (ref 3.87–5.11)
RDW: 11.9 % (ref 11.5–15.5)
WBC: 9.4 K/uL (ref 4.0–10.5)
nRBC: 0 % (ref 0.0–0.2)

## 2024-02-24 MED ORDER — METRONIDAZOLE 500 MG PO TABS
500.0000 mg | ORAL_TABLET | Freq: Two times a day (BID) | ORAL | Status: DC
  Administered 2024-02-24 – 2024-02-25 (×3): 500 mg via ORAL
  Filled 2024-02-24 (×3): qty 1

## 2024-02-24 NOTE — Progress Notes (Signed)
 02/24/2024  [name] Bass [id number] 06/03/1986  CARE TEAM: PCP: Patient, No Pcp Per  Outpatient Care Team: Patient Care Team: Patient, No Pcp Per as PCP - General (General Practice)  Inpatient Treatment Team: Treatment Team:  Ccs, Md, MD Ccs, Md, MD Bobbette Payor, MD Isidor Anette SQUIBB, RN Janet Maurilio BROCKS, NT Vilchis, Tona LABOR, NT Duwayne Angeline NOVAK, RN   Problem List:   Principal Problem:   Abscess of left buttock Active Problems:   Postoperative complication   02/22/2024  POST-OPERATIVE DIAGNOSIS:   Deep Abscess of right buttock History of [place] Butt Lift (BBL)   PROCEDURE: INCISION AND DRAINAGE DEEP BUTTOCK ABSCESS   SURGEON:  [name], MD    Assessment [facility] Stay = 2 days) 2 Days Post-Op    Stabilizing    Plan:  Continue IV antibiotics.  Given gram-positive negative rods continue cefazolin /metronidazole .  Follow white count.  Follow-up on cultures and sensitivity.  Try get her out of bed.  She is laying supine and she recalls being told she needed to be that way for 6 weeks after her BBL gluteal procedure with plastics.  Continues with a binder for her abdominoplasty/liposuction  Wound packing.  Pain control.  Hopefully if leukocytosis improved and pain improved, possibly leave tomorrow with the help of home health dressing changes.   If marginally worsens, may need re-exploration of the wound.  Hopefully not too likely.  -monitor electrolytes & replace as needed  Keep K>4, Mg>2, Phos>3  -VTE prophylaxis- SCDs.  Anticoagulation prophyllaxis SQ as appropriate  -mobilize as tolerated to help recovery.  Enlist therapies in moderate/high risk patients as appropriate  I updated the patient's status to the patient and significant other & nurse.   Recommendations were made.  Questions were answered.  They expressed understanding & appreciation.  -Disposition:  Disposition:  The patient is from: Home Anticipate discharge to:  Home  with Home Health Anticipated Date of Discharge is:  July 7,2025   Barriers to discharge:  Pending Clinical improvement (more likely than not)  Patient currently is NOT MEDICALLY STABLE for discharge from the hospital from a surgery standpoint.      I reviewed nursing notes, last 24 h vitals and pain scores, last 48 h intake and output, last 24 h labs and trends, and last 24 h imaging results.  I have reviewed this patient's available data, including medical history, events of note, test results, etc as part of my evaluation.   A significant portion of that time was spent in counseling. Care during the described time interval was provided by me.  This care required straight-forward level of medical decision making.  02/24/2024    Subjective: (Chief complaint)  Patient feeling better.  Still with significant pain that she prefers to lie on her belly and not sit.  Recalls being told by her plastic surgeon that she should not sit for the first 6 weeks after her BBL procedure.  Significant other in room.    Objective:  Vital signs:  Vitals:   02/23/24 0606 02/23/24 1445 02/23/24 2140 02/24/24 0420  BP: 132/79 129/73 (!) 119/94 112/69  Pulse: 87 69 92 74  Resp: 18 14 18 16   Temp: 98.1 F (36.7 C) 98.4 F (36.9 C) 98.6 F (37 C) 98.3 F (36.8 C)  TempSrc: Oral Oral Oral Oral  SpO2: 98% 100% 95% 99%  Weight:      Height:        Last BM Date : 02/21/24  Intake/Output  Yesterday:  07/05 0701 - 07/06 0700 In: 480 [P.O.:480] Out: -  This shift:  No intake/output data recorded.  Bowel function:  Flatus: YES  BM:  No  Drain: (No drain)   Physical Exam:  General: Pt awake/alert in no acute distress Eyes: PERRL, normal EOM.  Sclera clear.  No icterus Neuro: CN II-XII intact w/o focal sensory/motor deficits. Lymph: No head/neck/groin lymphadenopathy Psych:  No delerium/psychosis/paranoia.  Oriented x 4 HENT: Normocephalic, Mucus membranes moist.  No  thrush Neck: Supple, No tracheal deviation.  No obvious thyromegaly Chest: No pain to chest wall compression.  Good respiratory excursion.  No audible wheezing CV:  Pulses intact.  Regular rhythm.  No major extremity edema MS: Normal AROM mjr joints.  No obvious deformity  Right buttock cellulitis resolved with bilateral gluteal ecchymosis gradually resolving Old blood on dressing.  Right upper outer gluteal transverse incision 4 x 2 cm.   Ext:   No deformity.  No mjr edema.  No cyanosis Skin: No petechiae / purpurea.  No major sores.  Warm and dry    Results:   Cultures: Recent Results (from the past 720 hours)  Culture, blood (routine x 2)     Status: None (Preliminary result)   Collection Time: 02/20/24  9:09 PM   Specimen: BLOOD RIGHT FOREARM  Result Value Ref Range Status   Specimen Description   Final    BLOOD RIGHT FOREARM Performed at [facility], [address], [place], [place] [address]    Special Requests   Final    BOTTLES DRAWN AEROBIC AND ANAEROBIC Blood Culture results may not be optimal due to an inadequate volume of blood received in culture bottles Performed at [facility], [address], [place], [place] [address]    Culture   Final    NO GROWTH 4 DAYS Performed at [facility], [address] [place], [place] [address]    Report Status PENDING  Incomplete  Blood Culture (routine x 2)     Status: None (Preliminary result)   Collection Time: 02/21/24 10:50 PM   Specimen: BLOOD RIGHT FOREARM  Result Value Ref Range Status   Specimen Description   Final    BLOOD RIGHT FOREARM Performed at [facility], [address] [place], [place] [address]    Special Requests   Final    BOTTLES DRAWN AEROBIC ONLY Blood Culture adequate volume Performed at [facility], [address], [place], [place] [address]    Culture   Final    NO GROWTH 2 DAYS Performed at [facility], [address]  [place], [place] [address]    Report Status PENDING  Incomplete  Blood Culture (routine x 2)     Status: None (Preliminary result)   Collection Time: 02/22/24 12:20 AM   Specimen: BLOOD RIGHT ARM  Result Value Ref Range Status   Specimen Description   Final    BLOOD RIGHT ARM Performed at [facility], [address] [place], [place] [address]    Special Requests   Final    BOTTLES DRAWN AEROBIC AND ANAEROBIC Blood Culture results may not be optimal due to an inadequate volume of blood received in culture bottles Performed at [facility], [address], [place], [place] [address]    Culture   Final    NO GROWTH 2 DAYS Performed at [facility], [address] [place], [place] [address]  Report Status PENDING  Incomplete  Resp panel by RT-PCR (RSV, Flu A&B, Covid) Anterior Nasal Swab     Status: None   Collection Time: 02/22/24 12:30 AM   Specimen: Anterior Nasal Swab  Result Value Ref Range Status   SARS Coronavirus 2 by RT PCR NEGATIVE NEGATIVE Final    Comment: (NOTE) SARS-CoV-2 target nucleic acids are NOT DETECTED.  The SARS-CoV-2 RNA is generally detectable in upper respiratory specimens during the acute phase of infection. The lowest concentration of SARS-CoV-2 viral copies this assay can detect is 138 copies/mL. A negative result does not preclude SARS-Cov-2 infection and should not be used as the sole basis for treatment or other patient management decisions. A negative result may occur with  improper specimen collection/handling, submission of specimen other than nasopharyngeal swab, presence of viral mutation(s) within the areas targeted by this assay, and inadequate number of viral copies(<138 copies/mL). A negative result must be combined with clinical observations, patient history, and epidemiological information. The expected result is Negative.  Fact Sheet for Patients:  BloggerCourse.com  Fact Sheet for  Healthcare Providers:  SeriousBroker.it  This test is no t yet approved or cleared by the [place]  FDA and  has been authorized for detection and/or diagnosis of SARS-CoV-2 by FDA under an Emergency Use Authorization (EUA). This EUA will remain  in effect (meaning this test can be used) for the duration of the COVID-19 declaration under Section 564(b)(1) of the Act, 21 U.S.C.section 360bbb-3(b)(1), unless the authorization is terminated  or revoked sooner.       Influenza A by PCR NEGATIVE NEGATIVE Final   Influenza B by PCR NEGATIVE NEGATIVE Final    Comment: (NOTE) The Xpert Xpress SARS-CoV-2/FLU/RSV plus assay is intended as an aid in the diagnosis of influenza from Nasopharyngeal swab specimens and should not be used as a sole basis for treatment. Nasal washings and aspirates are unacceptable for Xpert Xpress SARS-CoV-2/FLU/RSV testing.  Fact Sheet for Patients: BloggerCourse.com  Fact Sheet for Healthcare Providers: SeriousBroker.it  This test is not yet approved or cleared by the [place]  FDA and has been authorized for detection and/or diagnosis of SARS-CoV-2 by FDA under an Emergency Use Authorization (EUA). This EUA will remain in effect (meaning this test can be used) for the duration of the COVID-19 declaration under Section 564(b)(1) of the Act, 21 U.S.C. section 360bbb-3(b)(1), unless the authorization is terminated or revoked.     Resp Syncytial Virus by PCR NEGATIVE NEGATIVE Final    Comment: (NOTE) Fact Sheet for Patients: BloggerCourse.com  Fact Sheet for Healthcare Providers: SeriousBroker.it  This test is not yet approved or cleared by the [place]  FDA and has been authorized for detection and/or diagnosis of SARS-CoV-2 by FDA under an Emergency Use Authorization (EUA). This EUA will remain in effect (meaning this  test can be used) for the duration of the COVID-19 declaration under Section 564(b)(1) of the Act, 21 U.S.C. section 360bbb-3(b)(1), unless the authorization is terminated or revoked.  Performed at [facility], [address] [place], [place] [address]   MRSA Next Gen by PCR, Nasal     Status: None   Collection Time: 02/22/24 12:30 AM   Specimen: Anterior Nasal Swab  Result Value Ref Range Status   MRSA by PCR Next Gen NOT DETECTED NOT DETECTED Final    Comment: (NOTE) The GeneXpert MRSA Assay (FDA approved for NASAL specimens only), is one component of a comprehensive MRSA colonization surveillance program. It is not intended to diagnose  MRSA infection nor to guide or monitor treatment for MRSA infections. Test performance is not FDA approved in patients less than 90 years old. Performed at [facility], [address], [place], [place] [address]   Aerobic/Anaerobic Culture w Gram Stain (surgical/deep wound)     Status: None (Preliminary result)   Collection Time: 02/22/24  8:32 AM   Specimen: Abscess  Result Value Ref Range Status   Specimen Description   Final    ABSCESS RIGHT BUTTOCKS Performed at [facility], [address] [place], [place] [address]    Special Requests   Final    NONE Performed at [facility], [address] [place], [place] [address]    Gram Stain   Final    ABUNDANT WBC PRESENT, PREDOMINANTLY PMN MODERATE GRAM POSITIVE COCCI RARE GRAM NEGATIVE RODS    Culture   Final    NORMAL SKIN FLORA Performed at [facility], [address], [place], [place] [address]    Report Status PENDING  Incomplete    Labs: Results for orders placed or performed during the hospital encounter of 02/21/24 (from the past 48 hours)  Aerobic/Anaerobic Culture w Gram Stain (surgical/deep wound)     Status: None (Preliminary result)   Collection Time: 02/22/24  8:32 AM   Specimen: Abscess  Result  Value Ref Range   Specimen Description      ABSCESS RIGHT BUTTOCKS Performed at [facility], [address], [place], [place] [address]    Special Requests      NONE Performed at [facility], [address], [place], [place] [address]    Gram Stain      ABUNDANT WBC PRESENT, PREDOMINANTLY PMN MODERATE GRAM POSITIVE COCCI RARE GRAM NEGATIVE RODS    Culture      NORMAL SKIN FLORA Performed at [facility], [address] [place], [place] [address]    Report Status PENDING   Iron and TIBC     Status: Abnormal   Collection Time: 02/22/24 12:36 PM  Result Value Ref Range   Iron 12 (L) 28 - 170 ug/dL   TIBC 737 749 - 549 ug/dL   Saturation Ratios 5 (L) 10.4 - 31.8 %   UIBC 250 ug/dL    Comment: Performed at [facility], [address], [place], [place] [address]  Vitamin B12     Status: None   Collection Time: 02/22/24 12:36 PM  Result Value Ref Range   Vitamin B-12 248 180 - 914 pg/mL    Comment: (NOTE) This assay is not validated for testing neonatal or myeloproliferative syndrome specimens for Vitamin B12 levels. Performed at [facility], [address], [place], [place] [address]   Folate     Status: None   Collection Time: 02/22/24 12:36 PM  Result Value Ref Range   Folate 18.6 >5.9 ng/mL    Comment: Performed at [facility], [address] [place], [place] [address]  CBC     Status: Abnormal   Collection Time: 02/23/24  2:35 AM  Result Value Ref Range   WBC 15.3 (H) 4.0 - 10.5 K/uL   RBC 2.99 (L) 3.87 - 5.11 MIL/uL   Hemoglobin 9.3 (L) 12.0 - 15.0 g/dL   HCT 72.0 (L) 63.9 - 53.9 %   MCV 93.3 80.0 - 100.0 fL   MCH 31.1 26.0 - 34.0 pg   MCHC 33.3 30.0 - 36.0 g/dL  RDW 11.8 11.5 - 15.5 %   Platelets 420 (H) 150 - 400 K/uL   nRBC 0.0 0.0 - 0.2 %    Comment: Performed at [facility], [address] [place], [place] [address]    Imaging /  Studies: No results found.   Medications / Allergies: per chart  Antibiotics: Anti-infectives (From admission, onward)    Start     Dose/Rate Route Frequency Ordered Stop   02/24/24 1000  metroNIDAZOLE  (FLAGYL ) tablet 500 mg        500 mg Oral Every 12 hours 02/24/24 0801     02/22/24 0600  ceFAZolin  (ANCEF ) IVPB 2g/100 mL premix        2 g 200 mL/hr over 30 Minutes Intravenous Every 8 hours 02/21/24 2323     02/22/24 0600  ceFAZolin  (ANCEF ) IVPB 2g/100 mL premix  Status:  Discontinued        2 g 200 mL/hr over 30 Minutes Intravenous On call to O.R. 02/21/24 2326 02/22/24 0932   02/21/24 2359  metroNIDAZOLE  (FLAGYL ) IVPB 500 mg  Status:  Discontinued        500 mg 100 mL/hr over 60 Minutes Intravenous Every 12 hours 02/21/24 2323 02/24/24 0801   02/21/24 2245  ceFEPIme  (MAXIPIME ) 2 g in sodium chloride  0.9 % 100 mL IVPB  Status:  Discontinued        2 g 200 mL/hr over 30 Minutes Intravenous  Once 02/21/24 2242 02/21/24 2340   02/21/24 2245  vancomycin  (VANCOCIN ) IVPB 1000 mg/200 mL premix        1,000 mg 200 mL/hr over 60 Minutes Intravenous  Once 02/21/24 2242 02/22/24 0238         Note: Portions of this report may have been transcribed using voice recognition software. Every effort was made to ensure accuracy; however, inadvertent computerized transcription errors may be present.   Any transcriptional errors that result from this process are unintentional.    [name], MD, FACS, MASCRS Esophageal, Gastrointestinal & Colorectal Surgery Robotic and Minimally Invasive Surgery  [facility] [address], [address] [place], [place] [address] [phone] Fax [phone] Main  CONTACT INFORMATION: Weekday (9AM-5PM): Call [facility] main office at [phone] Weeknight (5PM-9AM) or Weekend/Holiday: Check EPIC Web Links tab & use AMION (password  [email or web address]) for General Surgery [facility] coverage  Please, DO NOT use  SecureChat  (it is not reliable communication to reach operating surgeons & will lead to a delay in care).   Epic staff messaging available for outptient concerns needing 1-2 business day response.      02/24/2024  8:07 AM

## 2024-02-24 NOTE — Plan of Care (Signed)

## 2024-02-24 NOTE — Progress Notes (Signed)
 PROGRESS NOTE    Patient: [name] Bass                            PCP: Patient, No Pcp Per                    DOB: [birth year]            DOA: 02/21/2024 FMW:[id number]             DOS: 02/24/2024, 11:33 AM   LOS: 2 days   Date of Service: The patient was seen and examined on 02/24/2024  Subjective:   The patient was seen and examined, in no acute distress, hemodynamically stable  S/P 02/22/2024 right buttocks I&D by general surgery Dr. Sheldon  Brief Narrative:   [name] Bass is a 38 y.o. female with medical history significant of Brazilian butt lift procedure postop day 16 presented to emergency for the second time to bed complaining of worsening fever, persistent pain. Patient had recent [place] butt lift procedure done by Dr. Aisha at outpatient surgical center in [facility] 02/05/2024.  5 days ago patient had a pain started on the right buttock with associated fever.   She was seen by her surgeon reported aspirated the area of the right buttock and started on Bactrim  however presented to ED for the second time today for persistent pain.  In the ED 02/21/2024 patient found to have suspected cellulitis and questionable abscess of the right buttock confirmed on the CT imaging subsequently underwent aspiration around 10 cc purulent discharge and patient was sent home to follow-up with surgeon in the office 7/5. 7/3 ED today- revealed concern for abscess formation. ED physician initially spoke with patient's or general surgeon Dr. Aisha who stated that he does not have any hospital privilege to do any surgical procedure and recommended to speak with our inpatient surgeon for further evaluation.    Dr. Garnette Sheldon has been consulted..  Per general surgery no suspicion for Fournier's gangrene/necrotic fasciitis at this time.  Recommended IV antibiotic coverage and plan to take her OR in the morning 7/4- plan for I&D.     At presentation to ED patient is tachycardic otherwise  hemodynamically stable. CBC showing leukocytosis 19.5, stable H&H normal platelet count. CMP showing low bicarb 21, low albumin 3.1  Lactic acid within normal range. Pregnancy test negative. EKG shows sinus tachycardia heart rate 128. Blood cultures are in process.  CT abdomen pelvis from 7/2 showed 5.2 cm x 6.0 cm x 7.3 cm abscess within the subcutaneous fat along the posterolateral pelvic wall on the right with additional areas of adjacent cellulitis and subcutaneous emphysema. 2. Bilateral nonobstructing renal calculi. 3. Evidence of prior cholecystectomy.   Was given IV vancomycin , cefepime -General Surgery ordered IV cefazolin  and metronidazole  for surgical prophylaxis and 1 L of LR bolus followed by continue LR 150 cc/h.     Assessment & Plan:   Principal Problem:   Abscess of left buttock   Anemia       Assessment and Plan:  Postop complication-posterolateral right pelvic wall abscess -S/p I&D postop day 2  - Hemodynamically stable, afebrile, normotensive improved leukocytosis WBC 19.5 >> 17.7 >> 15.3  -Patient recently 02/05/2024 has [place] butt lift procedure which has been complicated with abscess formation.   -Primary plastic surgeon Dr. Aisha  - Consulted general surgeon Dr. Garnette Sheldon - 02/22/2024-s/p right Botox abscess I&D -following up with cultures  - CT abdomen pelvis  showed 5.2 cm x 6.0 cm x 7.3 cm abscess within the subcutaneous fat along the posterolateral pelvic wall on the right with additional areas of adjacent cellulitis and subcutaneous emphysema.  -Continue current antibiotics of IV cefazolin , Flagyl  - Continue IVF -discontinued tolerating clear  - Wound cultures are growing gram-positive cocci  Following up with cultures closely -determine final antibiotic course  Continue wound care per nursing   Acute on chronic anemia-severe iron deficiency -Monitor H&H closely Iron/TIBC/Ferritin/ %Sat    Component Value Date/Time   IRON 12  (L) 02/22/2024 1236   TIBC 262 02/22/2024 1236   IRONPCTSAT 5 (L) 02/22/2024 1236   -Initiating oral iron supplements     ----------------------------------------------------------------------------------------------------------------------------------------------- Nutritional status:  The patient's BMI is: Body mass index is 26.04 kg/m. I agree with the assessment and plan as outlined    ---------------------------------------------------------------------------------------------------------------------------------------------------- Cultures; Intraoperative wound cultures >> Gram-positive rods Blood cultures >> no growth to date  02/21/2024 -IV antibiotics cefazolin /metronidazole    ------------------------------------------------------------------------------------------------------------------------------------------------  DVT prophylaxis:  Place and maintain sequential compression device Start: 02/24/24 1129 SCDs Start: 02/22/24 0018 Place TED hose Start: 02/22/24 0018 SCD's Start: 02/21/24 2325   Code Status:   Code Status: Full Code  Family Communication: Boyfriend present at bedside -Advance care planning has been discussed.   Admission status:   Status is: Observation The patient remains OBS appropriate and will d/c before 2 midnights.   Disposition: From  - home             Planning for discharge in 1-2 days: to   Procedures:   No admission procedures for hospital encounter.   Antimicrobials:  Anti-infectives (From admission, onward)    Start     Dose/Rate Route Frequency Ordered Stop   02/24/24 1000  metroNIDAZOLE  (FLAGYL ) tablet 500 mg        500 mg Oral Every 12 hours 02/24/24 0801     02/22/24 0600  ceFAZolin  (ANCEF ) IVPB 2g/100 mL premix        2 g 200 mL/hr over 30 Minutes Intravenous Every 8 hours 02/21/24 2323     02/22/24 0600  ceFAZolin  (ANCEF ) IVPB 2g/100 mL premix  Status:  Discontinued        2 g 200 mL/hr over 30 Minutes Intravenous  On call to O.R. 02/21/24 2326 02/22/24 0932   02/21/24 2359  metroNIDAZOLE  (FLAGYL ) IVPB 500 mg  Status:  Discontinued        500 mg 100 mL/hr over 60 Minutes Intravenous Every 12 hours 02/21/24 2323 02/24/24 0801   02/21/24 2245  ceFEPIme  (MAXIPIME ) 2 g in sodium chloride  0.9 % 100 mL IVPB  Status:  Discontinued        2 g 200 mL/hr over 30 Minutes Intravenous  Once 02/21/24 2242 02/21/24 2340   02/21/24 2245  vancomycin  (VANCOCIN ) IVPB 1000 mg/200 mL premix        1,000 mg 200 mL/hr over 60 Minutes Intravenous  Once 02/21/24 2242 02/22/24 0238        Medication:   acetaminophen   1,000 mg Oral Q6H   Chlorhexidine  Gluconate Cloth  6 each Topical Once   docusate sodium   100 mg Oral Daily   ferrous sulfate   325 mg Oral TID WC   gabapentin   300 mg Oral QHS   metroNIDAZOLE   500 mg Oral Q12H   naproxen   500 mg Oral BID WC   polycarbophil  625 mg Oral BID   sodium chloride  flush  10-40 mL Intracatheter Q12H    alum & mag  hydroxide-simeth, bisacodyl , diphenhydrAMINE , HYDROmorphone  (DILAUDID ) injection, LORazepam , magic mouthwash, menthol -cetylpyridinium, methocarbamol  (ROBAXIN ) injection, methocarbamol , naphazoline-glycerin , ondansetron  (ZOFRAN ) IV, oxyCODONE , phenol, polyethylene glycol, simethicone , sodium chloride    Objective:   Vitals:   02/23/24 0606 02/23/24 1445 02/23/24 2140 02/24/24 0420  BP: 132/79 129/73 (!) 119/94 112/69  Pulse: 87 69 92 74  Resp: 18 14 18 16   Temp: 98.1 F (36.7 C) 98.4 F (36.9 C) 98.6 F (37 C) 98.3 F (36.8 C)  TempSrc: Oral Oral Oral Oral  SpO2: 98% 100% 95% 99%  Weight:      Height:        Intake/Output Summary (Last 24 hours) at 02/24/2024 1133 Last data filed at 02/23/2024 1700 Gross per 24 hour  Intake 240 ml  Output --  Net 240 ml   Filed Weights   02/21/24 2112 02/22/24 0100  Weight: 79.4 kg 82.3 kg     Physical examination:          General:  AAO x 3,  cooperative, no distress;   HEENT:  Normocephalic, PERRL, otherwise  with in Normal limits   Neuro:  CNII-XII intact. , normal motor and sensation, reflexes intact   Lungs:   Clear to auscultation BL, Respirations unlabored,  No wheezes / crackles  Cardio:    S1/S2, RRR, No murmure, No Rubs or Gallops   Abdomen:  Soft, non-tender, bowel sounds active all four quadrants, no guarding or peritoneal signs.  Muscular  skeletal:  Limited exam -global generalized weaknesses - in bed, able to move all 4 extremities,   2+ pulses,  symmetric, No pitting edema  Skin:  Dry, warm to touch, right buttocks surgical wound S/P I&D incision wound draining packing in place-mild erythema noted edema  Wounds: Right buttocks wound     ------------------------------------------------------------------------------------------------------    LABs:     Latest Ref Rng & Units 02/23/2024    2:35 AM 02/22/2024    4:05 AM 02/21/2024   11:08 PM  CBC  WBC 4.0 - 10.5 K/uL 15.3  17.7  19.5   Hemoglobin 12.0 - 15.0 g/dL 9.3  8.7  89.9   Hematocrit 36.0 - 46.0 % 27.9  26.4  29.5   Platelets 150 - 400 K/uL 420  375  431       Latest Ref Rng & Units 02/22/2024    4:05 AM 02/21/2024   11:08 PM 02/20/2024    9:09 PM  CMP  Glucose 70 - 99 mg/dL 892  880  873   BUN 6 - 20 mg/dL 8  10  10    Creatinine 0.44 - 1.00 mg/dL 9.36  9.45  9.38   Sodium 135 - 145 mmol/L 137  135  133   Potassium 3.5 - 5.1 mmol/L 3.6  3.6  3.5   Chloride 98 - 111 mmol/L 103  105  98   CO2 22 - 32 mmol/L 24  21  23    Calcium  8.9 - 10.3 mg/dL 8.4  8.6  8.8   Total Protein 6.5 - 8.1 g/dL 5.6  6.3  6.8   Total Bilirubin 0.0 - 1.2 mg/dL 0.8  0.7  0.6   Alkaline Phos 38 - 126 U/L 90  75  62   AST 15 - 41 U/L 45  16  17   ALT 0 - 44 U/L 36  20  16        Micro Results Recent Results (from the past 240 hours)  Culture, blood (routine x 2)     Status: None (  Preliminary result)   Collection Time: 02/20/24  9:09 PM   Specimen: BLOOD RIGHT FOREARM  Result Value Ref Range Status   Specimen Description   Final     BLOOD RIGHT FOREARM Performed at [facility], [address], [place], [place] [address]    Special Requests   Final    BOTTLES DRAWN AEROBIC AND ANAEROBIC Blood Culture results may not be optimal due to an inadequate volume of blood received in culture bottles Performed at [facility], [address], [place], [place] [address]    Culture   Final    NO GROWTH 4 DAYS Performed at [facility], [address], [place], [place] [address]    Report Status PENDING  Incomplete  Blood Culture (routine x 2)     Status: None (Preliminary result)   Collection Time: 02/21/24 10:50 PM   Specimen: BLOOD RIGHT FOREARM  Result Value Ref Range Status   Specimen Description   Final    BLOOD RIGHT FOREARM Performed at [facility], [address] [place], [place] [address]    Special Requests   Final    BOTTLES DRAWN AEROBIC ONLY Blood Culture adequate volume Performed at [facility], [address] [place], [place] [address]    Culture   Final    NO GROWTH 2 DAYS Performed at [facility], [address] [place], [place] [address]    Report Status PENDING  Incomplete  Blood Culture (routine x 2)     Status: None (Preliminary result)   Collection Time: 02/22/24 12:20 AM   Specimen: BLOOD RIGHT ARM  Result Value Ref Range Status   Specimen Description   Final    BLOOD RIGHT ARM Performed at [facility], [address], [place], [place] [address]    Special Requests   Final    BOTTLES DRAWN AEROBIC AND ANAEROBIC Blood Culture results may not be optimal due to an inadequate volume of blood received in culture bottles Performed at [facility], [address] [place], [place] [address]    Culture   Final    NO GROWTH 2 DAYS Performed at [facility], [address], [place], [place] [address]    Report Status PENDING  Incomplete  Resp panel by RT-PCR (RSV, Flu A&B, Covid) Anterior Nasal Swab      Status: None   Collection Time: 02/22/24 12:30 AM   Specimen: Anterior Nasal Swab  Result Value Ref Range Status   SARS Coronavirus 2 by RT PCR NEGATIVE NEGATIVE Final    Comment: (NOTE) SARS-CoV-2 target nucleic acids are NOT DETECTED.  The SARS-CoV-2 RNA is generally detectable in upper respiratory specimens during the acute phase of infection. The lowest concentration of SARS-CoV-2 viral copies this assay can detect is 138 copies/mL. A negative result does not preclude SARS-Cov-2 infection and should not be used as the sole basis for treatment or other patient management decisions. A negative result may occur with  improper specimen collection/handling, submission of specimen other than nasopharyngeal swab, presence of viral mutation(s) within the areas targeted by this assay, and inadequate number of viral copies(<138 copies/mL). A negative result must be combined with clinical observations, patient history, and epidemiological information. The expected result is Negative.  Fact Sheet for Patients:  BloggerCourse.com  Fact Sheet for Healthcare Providers:  SeriousBroker.it  This test is no t yet approved or cleared by the [place]  FDA and  has been authorized for detection and/or diagnosis of SARS-CoV-2 by FDA under an Emergency Use Authorization (EUA). This EUA will remain  in effect (meaning this test can be used) for the duration of the COVID-19 declaration under Section 564(b)(1) of the Act, 21 U.S.C.section 360bbb-3(b)(1), unless the authorization is terminated  or revoked sooner.       Influenza A by PCR NEGATIVE NEGATIVE Final   Influenza B by PCR NEGATIVE NEGATIVE Final    Comment: (NOTE) The Xpert Xpress SARS-CoV-2/FLU/RSV plus assay is intended as an aid in the diagnosis of influenza from Nasopharyngeal swab specimens and should not be used as a sole basis for treatment. Nasal washings and aspirates are  unacceptable for Xpert Xpress SARS-CoV-2/FLU/RSV testing.  Fact Sheet for Patients: BloggerCourse.com  Fact Sheet for Healthcare Providers: SeriousBroker.it  This test is not yet approved or cleared by the [place]  FDA and has been authorized for detection and/or diagnosis of SARS-CoV-2 by FDA under an Emergency Use Authorization (EUA). This EUA will remain in effect (meaning this test can be used) for the duration of the COVID-19 declaration under Section 564(b)(1) of the Act, 21 U.S.C. section 360bbb-3(b)(1), unless the authorization is terminated or revoked.     Resp Syncytial Virus by PCR NEGATIVE NEGATIVE Final    Comment: (NOTE) Fact Sheet for Patients: BloggerCourse.com  Fact Sheet for Healthcare Providers: SeriousBroker.it  This test is not yet approved or cleared by the [place]  FDA and has been authorized for detection and/or diagnosis of SARS-CoV-2 by FDA under an Emergency Use Authorization (EUA). This EUA will remain in effect (meaning this test can be used) for the duration of the COVID-19 declaration under Section 564(b)(1) of the Act, 21 U.S.C. section 360bbb-3(b)(1), unless the authorization is terminated or revoked.  Performed at [facility], [address], [place], [place] [address]   MRSA Next Gen by PCR, Nasal     Status: None   Collection Time: 02/22/24 12:30 AM   Specimen: Anterior Nasal Swab  Result Value Ref Range Status   MRSA by PCR Next Gen NOT DETECTED NOT DETECTED Final    Comment: (NOTE) The GeneXpert MRSA Assay (FDA approved for NASAL specimens only), is one component of a comprehensive MRSA colonization surveillance program. It is not intended to diagnose MRSA infection nor to guide or monitor treatment for MRSA infections. Test performance is not FDA approved in patients less than 37 years old. Performed at  [facility], [address], [place], [place] [address]   Aerobic/Anaerobic Culture w Gram Stain (surgical/deep wound)     Status: None (Preliminary result)   Collection Time: 02/22/24  8:32 AM   Specimen: Abscess  Result Value Ref Range Status   Specimen Description   Final    ABSCESS RIGHT BUTTOCKS Performed at [facility], [address] [place], [place] [address]    Special Requests   Final    NONE Performed at [facility], [address] [place], [place] [address]    Gram Stain   Final    ABUNDANT WBC PRESENT, PREDOMINANTLY PMN MODERATE GRAM POSITIVE COCCI RARE GRAM NEGATIVE RODS Performed at [facility], [address], [place], [place] [address]    Culture   Final    RARE NORMAL SKIN FLORA NO ANAEROBES ISOLATED; CULTURE IN PROGRESS FOR 5 DAYS    Report Status PENDING  Incomplete    Radiology Reports No results found.   SIGNED: Adriana DELENA Grams, MD,  FHM. FAAFP. Jolynn Pack - [facility] Time spent - 55 min.  In seeing, evaluating and examining the patient. Reviewing medical records, labs, drawn plan of care. [facility],  Pager (please use [email or web address] to page/ text) Please use Epic Secure Chat for non-urgent communication (7AM-7PM)  If 7PM-7AM, please contact night-coverage [email or web address], 02/24/2024, 11:33 AM

## 2024-02-25 LAB — CBC
HCT: 32.4 % — ABNORMAL LOW (ref 36.0–46.0)
Hemoglobin: 10.7 g/dL — ABNORMAL LOW (ref 12.0–15.0)
MCH: 30.9 pg (ref 26.0–34.0)
MCHC: 33 g/dL (ref 30.0–36.0)
MCV: 93.6 fL (ref 80.0–100.0)
Platelets: 515 K/uL — ABNORMAL HIGH (ref 150–400)
RBC: 3.46 MIL/uL — ABNORMAL LOW (ref 3.87–5.11)
RDW: 12 % (ref 11.5–15.5)
WBC: 11.2 K/uL — ABNORMAL HIGH (ref 4.0–10.5)
nRBC: 0 % (ref 0.0–0.2)

## 2024-02-25 LAB — CULTURE, BLOOD (ROUTINE X 2): Culture: NO GROWTH

## 2024-02-25 LAB — GLUCOSE, CAPILLARY: Glucose-Capillary: 93 mg/dL (ref 70–99)

## 2024-02-25 MED ORDER — OXYCODONE HCL 5 MG PO TABS: 5.0000 mg | ORAL_TABLET | Freq: Four times a day (QID) | ORAL | 0 refills | Status: DC | PRN

## 2024-02-25 MED ORDER — METRONIDAZOLE 500 MG PO TABS: 500.0000 mg | ORAL_TABLET | Freq: Two times a day (BID) | ORAL | 0 refills | Status: DC

## 2024-02-25 MED ORDER — CIPROFLOXACIN HCL 500 MG PO TABS: 500.0000 mg | ORAL_TABLET | Freq: Two times a day (BID) | ORAL | 0 refills | Status: AC

## 2024-02-25 MED ORDER — OXYCODONE HCL 5 MG PO TABS: 5.0000 mg | ORAL_TABLET | Freq: Four times a day (QID) | ORAL | 0 refills | Status: AC | PRN

## 2024-02-25 MED ORDER — CIPROFLOXACIN HCL 500 MG PO TABS: 500.0000 mg | ORAL_TABLET | Freq: Two times a day (BID) | ORAL | 0 refills | Status: DC

## 2024-02-25 MED ORDER — METRONIDAZOLE 500 MG PO TABS: 500.0000 mg | ORAL_TABLET | Freq: Two times a day (BID) | ORAL | 0 refills | Status: AC

## 2024-02-25 NOTE — Discharge Summary (Signed)
 [facility] Discharge Summary   Patient ID: [name] Bass MRN: [id number] DOB/AGE: 12-03-1985 38 y.o.  Admit date: 02/21/2024 Discharge date: 02/25/2024  Admitting Diagnosis: Deep Abscess of right buttock   Discharge Diagnosis Patient Active Problem List   Diagnosis Date Noted   Abscess of left buttock 02/22/2024   Postoperative complication 02/22/2024   Nephrolithiasis 01/17/2020   Constipation 03/04/2015   Enteritis 03/03/2015   Hypokalemia 03/03/2015   Ileus (HCC) 03/03/2015   Abdominal pain 03/03/2015   Headache 03/03/2015   Vaginal delivery 08/04/2012   Bilateral kidney stones 07/08/2012   Abnormal glucose tolerance test in pregnancy, antepartum 07/08/2012   Unsure of LMP  07/08/2012   Normal pregnancy 01/17/2012   Abnormal Pap smear    Polycystic ovarian syndrome    CIN I (cervical intraepithelial neoplasia I)    Herpes simplex virus (HSV) infection    FHx: suicide    HSV (herpes simplex virus) anogenital infection 12/02/2011   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  Procedures INCISION AND DRAINAGE DEEP BUTTOCK ABSCESS by Dr. Sheldon on [facility] Course:  Patient was admitted and underwent procedure listed above.  Tolerated procedure well and was transferred to the floor.  Diet was advanced as tolerated.  On POD 3, the patient was voiding well, tolerating diet, ambulating well, pain well controlled, vital signs stable, incisions c/d/i and felt stable for discharge home.    She will call to set up appointment date/time.    Physical Exam: General:  Alert, NAD, pleasant, comfortable R. Gluteal region: incisions C/D/I, drain with minimal serous drainage, there is minimal granulations with small amount of fibrin tissue formation. There is no surrounding erythema or fluctuance around area wound.     Allergies as of 02/25/2024       Reactions   Amoxicillin Rash, Other (See Comments)   Has patient had a PCN reaction causing immediate rash, facial/tongue/throat swelling, SOB or lightheadedness with hypotension: No Has patient had a PCN reaction causing severe rash involving mucus membranes or skin necrosis: No Has patient had a PCN reaction that required hospitalization No Has patient had a PCN reaction occurring within the last 10 years: No If all of the above answers are NO, then may proceed with Cephalosporin use.   Egg-derived Products Rash        Medication List     STOP taking these medications    naproxen  500 MG tablet Commonly known as: NAPROSYN    oxyCODONE -acetaminophen  5-325 MG tablet Commonly known as: PERCOCET/ROXICET   phenazopyridine  200 MG tablet Commonly known as: Pyridium    senna-docusate 8.6-50 MG tablet Commonly known as: Senokot-S   sulfamethoxazole -trimethoprim  800-160 MG tablet Commonly known as: BACTRIM  DS   traMADol  50 MG tablet Commonly known as: Ultram        TAKE these medications    ciprofloxacin  500 MG tablet Commonly known as: Cipro  Take 1 tablet (500 mg total) by mouth 2 (two) times daily.   Ibuprofen  200 MG Caps Take 200-800 mg by mouth every 6 (six) hours as needed (for pain).   metroNIDAZOLE  500 MG tablet Commonly known as: FLAGYL  Take 1 tablet (500 mg total) by mouth 2 (two) times daily.   ondansetron  4 MG tablet Commonly known as: ZOFRAN  Take 1 tablet (4 mg total) by mouth every 6 (six) hours.   oxyCODONE  5 MG immediate  release tablet Commonly known as: Oxy IR/ROXICODONE  Take 1 tablet (5 mg total) by mouth every 6 (six) hours as needed for severe pain (pain score 7-10).   Tylenol  325 MG tablet Generic drug: acetaminophen  Take 650 mg by mouth every 6 (six) hours as needed (for pain).               Discharge Care Instructions  (From admission, onward)           Start     Ordered   02/25/24 0000   Change dressing (specify)       Comments: Dressing change: Daily   02/25/24 0943              Follow-up Information     Maczis, Tonja Barban, PA-C Follow up.   Specialty: General Surgery Why: As needed Contact information: [address] [place] [address] [facility] [place] [address] [phone]         [place] [facility] Follow up.                  Signed: Eulah Hammonds, PA-C [facility] 02/25/2024, 10:03 AM

## 2024-02-25 NOTE — Plan of Care (Signed)

## 2024-02-25 NOTE — Progress Notes (Signed)
 Discharge instructions were reviewed with the patient. She request for medications to be sent to the [facility] medications to [facility] [address] [place], [place] [address] Phone: [phone]. Supplies for wound care provided. PA notified awaiting response.

## 2024-02-27 LAB — CULTURE, BLOOD (ROUTINE X 2)
Culture: NO GROWTH
Culture: NO GROWTH
Special Requests: ADEQUATE

## 2024-02-27 LAB — AEROBIC/ANAEROBIC CULTURE W GRAM STAIN (SURGICAL/DEEP WOUND): Culture: NORMAL
